# Patient Record
Sex: Male | Born: 1955 | Race: White | Hispanic: No | Smoking: Never smoker
Health system: Southern US, Community
[De-identification: ages and names within clinical notes are randomized; demographics above are authoritative.]

## PROBLEM LIST (undated history)

## (undated) DIAGNOSIS — Z87442 Personal history of urinary calculi: Secondary | ICD-10-CM

## (undated) DIAGNOSIS — T8859XA Other complications of anesthesia, initial encounter: Secondary | ICD-10-CM

## (undated) DIAGNOSIS — K219 Gastro-esophageal reflux disease without esophagitis: Secondary | ICD-10-CM

## (undated) DIAGNOSIS — T4145XA Adverse effect of unspecified anesthetic, initial encounter: Secondary | ICD-10-CM

## (undated) DIAGNOSIS — Z86718 Personal history of other venous thrombosis and embolism: Secondary | ICD-10-CM

## (undated) DIAGNOSIS — G8929 Other chronic pain: Secondary | ICD-10-CM

## (undated) DIAGNOSIS — M549 Dorsalgia, unspecified: Secondary | ICD-10-CM

## (undated) DIAGNOSIS — N200 Calculus of kidney: Secondary | ICD-10-CM

## (undated) DIAGNOSIS — R112 Nausea with vomiting, unspecified: Secondary | ICD-10-CM

## (undated) DIAGNOSIS — I509 Heart failure, unspecified: Secondary | ICD-10-CM

## (undated) DIAGNOSIS — H3589 Other specified retinal disorders: Secondary | ICD-10-CM

## (undated) DIAGNOSIS — M75100 Unspecified rotator cuff tear or rupture of unspecified shoulder, not specified as traumatic: Secondary | ICD-10-CM

## (undated) DIAGNOSIS — Z9889 Other specified postprocedural states: Secondary | ICD-10-CM

## (undated) DIAGNOSIS — M1990 Unspecified osteoarthritis, unspecified site: Secondary | ICD-10-CM

## (undated) HISTORY — PX: OTHER SURGICAL HISTORY: SHX169

## (undated) HISTORY — PX: TONSILLECTOMY: SUR1361

## (undated) HISTORY — DX: Personal history of other venous thrombosis and embolism: Z86.718

---

## 1995-04-06 HISTORY — PX: BACK SURGERY: SHX140

## 2008-04-05 DIAGNOSIS — Z86718 Personal history of other venous thrombosis and embolism: Secondary | ICD-10-CM

## 2008-04-05 HISTORY — DX: Personal history of other venous thrombosis and embolism: Z86.718

## 2008-11-29 ENCOUNTER — Emergency Department (HOSPITAL_COMMUNITY): Admission: EM | Admit: 2008-11-29 | Discharge: 2008-11-29 | Payer: Self-pay | Admitting: Emergency Medicine

## 2009-02-24 ENCOUNTER — Ambulatory Visit (HOSPITAL_COMMUNITY): Admission: RE | Admit: 2009-02-24 | Discharge: 2009-02-24 | Payer: Self-pay | Admitting: Internal Medicine

## 2009-04-05 HISTORY — PX: BACK SURGERY: SHX140

## 2010-04-26 ENCOUNTER — Encounter: Payer: Self-pay | Admitting: Internal Medicine

## 2010-07-11 LAB — URINALYSIS, ROUTINE W REFLEX MICROSCOPIC
Protein, ur: 30 mg/dL — AB
Specific Gravity, Urine: 1.03 (ref 1.005–1.030)

## 2010-07-11 LAB — URINE MICROSCOPIC-ADD ON

## 2011-06-29 ENCOUNTER — Other Ambulatory Visit: Payer: Self-pay

## 2011-06-29 ENCOUNTER — Telehealth: Payer: Self-pay

## 2011-06-29 DIAGNOSIS — Z139 Encounter for screening, unspecified: Secondary | ICD-10-CM

## 2011-06-29 NOTE — Telephone Encounter (Signed)
Gastroenterology Pre-Procedure Form    Request Date: 06/29/2011     Requesting Physician: Dr. Sherwood Gambler     PATIENT INFORMATION:  Gary Ferguson is a 56 y.o., male (DOB=12/26/1955).  PROCEDURE: Procedure(s) requested: colonoscopy Procedure Reason: screening for colon cancer  PATIENT REVIEW QUESTIONS: The patient reports the following:   1. Diabetes Melitis: no 2. Joint replacements in the past 12 months: no 3. Major health problems in the past 3 months: no 4. Has an artificial valve or MVP:no 5. Has been advised in past to take antibiotics in advance of a procedure like teeth cleaning: no}    MEDICATIONS & ALLERGIES:    Patient reports the following regarding taking any blood thinners:   Plavix? no Aspirin?no Coumadin?  no  Patient confirms/reports the following medications:  Current Outpatient Prescriptions  Medication Sig Dispense Refill  . ciprofloxacin (CIPRO) 500 MG tablet Take 500 mg by mouth 2 (two) times daily. Pt has been on Cipro 500 mg   Bid for about 2 years for an infection in his back      . naproxen sodium (ANAPROX) 220 MG tablet Take 220 mg by mouth once.      Marland Kitchen omeprazole (PRILOSEC) 20 MG capsule Take 20 mg by mouth daily.        Patient confirms/reports the following allergies:  Allergies  Allergen Reactions  . Penicillins     Pt unsure of the effect/ he was small    Patient is appropriate to schedule for requested procedure(s): yes  AUTHORIZATION INFORMATION Primary Insurance:   ID #:   Group #:  Pre-Cert / Auth required:  Pre-Cert / Auth #:   Secondary Insurance:   ID #:   Group #:  Pre-Cert / Auth required Pre-Cert / Auth #:   No orders of the defined types were placed in this encounter.    SCHEDULE INFORMATION: Procedure has been scheduled as follows:  Date: 07/21/2011     Time: 12:45 PM  Location: [facility] Short Stay  This Gastroenterology Pre-Precedure Form is being routed to the following provider(s) for review: R. Roetta Sessions, MD

## 2011-06-30 NOTE — Telephone Encounter (Signed)
OK to schedule

## 2011-07-05 NOTE — Telephone Encounter (Signed)
Rx and instructions mailed.  

## 2011-07-19 ENCOUNTER — Encounter (HOSPITAL_COMMUNITY): Payer: Self-pay | Admitting: Pharmacy Technician

## 2011-07-20 MED ORDER — SODIUM CHLORIDE 0.45 % IV SOLN
Freq: Once | INTRAVENOUS | Status: AC
  Administered 2011-07-21: 1000 mL via INTRAVENOUS

## 2011-07-21 ENCOUNTER — Encounter (HOSPITAL_COMMUNITY): Payer: Self-pay | Admitting: *Deleted

## 2011-07-21 ENCOUNTER — Ambulatory Visit (HOSPITAL_COMMUNITY)
Admission: RE | Admit: 2011-07-21 | Discharge: 2011-07-21 | Disposition: A | Discharge status: Home Health | Payer: Medicare Other | Source: Ambulatory Visit | Attending: Internal Medicine | Admitting: Internal Medicine

## 2011-07-21 ENCOUNTER — Encounter (HOSPITAL_COMMUNITY)
Admission: RE | Disposition: A | Discharge status: Home Health | Payer: Self-pay | Source: Ambulatory Visit | Attending: Internal Medicine

## 2011-07-21 DIAGNOSIS — Z1211 Encounter for screening for malignant neoplasm of colon: Secondary | ICD-10-CM

## 2011-07-21 DIAGNOSIS — Z139 Encounter for screening, unspecified: Secondary | ICD-10-CM

## 2011-07-21 DIAGNOSIS — K5730 Diverticulosis of large intestine without perforation or abscess without bleeding: Secondary | ICD-10-CM | POA: Insufficient documentation

## 2011-07-21 DIAGNOSIS — Z83719 Family history of colon polyps, unspecified: Secondary | ICD-10-CM | POA: Insufficient documentation

## 2011-07-21 DIAGNOSIS — Z8371 Family history of colonic polyps: Secondary | ICD-10-CM | POA: Insufficient documentation

## 2011-07-21 HISTORY — DX: Personal history of other venous thrombosis and embolism: Z86.718

## 2011-07-21 HISTORY — DX: Other specified retinal disorders: H35.89

## 2011-07-21 HISTORY — PX: COLONOSCOPY: SHX5424

## 2011-07-21 HISTORY — DX: Gastro-esophageal reflux disease without esophagitis: K21.9

## 2011-07-21 HISTORY — DX: Unspecified osteoarthritis, unspecified site: M19.90

## 2011-07-21 SURGERY — COLONOSCOPY
Anesthesia: Moderate Sedation

## 2011-07-21 MED ORDER — MIDAZOLAM HCL 5 MG/5ML IJ SOLN
INTRAMUSCULAR | Status: AC
  Filled 2011-07-21: qty 10

## 2011-07-21 MED ORDER — STERILE WATER FOR IRRIGATION IR SOLN
Status: DC | PRN
  Administered 2011-07-21: 11:00:00

## 2011-07-21 MED ORDER — MIDAZOLAM HCL 5 MG/5ML IJ SOLN
INTRAMUSCULAR | Status: DC | PRN
  Administered 2011-07-21 (×2): 2 mg via INTRAVENOUS

## 2011-07-21 MED ORDER — MEPERIDINE HCL 100 MG/ML IJ SOLN
INTRAMUSCULAR | Status: AC
  Filled 2011-07-21: qty 1

## 2011-07-21 MED ORDER — MEPERIDINE HCL 100 MG/ML IJ SOLN
INTRAMUSCULAR | Status: DC | PRN
Start: 2011-07-21 — End: 2011-07-21
  Administered 2011-07-21: 50 mg via INTRAVENOUS
  Administered 2011-07-21: 25 mg via INTRAVENOUS

## 2011-07-21 NOTE — Op Note (Signed)
[facility] [address] [place], [place]  [address]  COLONOSCOPY PROCEDURE REPORT  PATIENT:  Gary Ferguson, Gary Ferguson  MR#:  [id number] BIRTHDATE:  [birth year], 56 yrs. old  GENDER:  male ENDOSCOPIST:  R. Roetta Sessions, MD FACP [facility] REF. BY:  Artis Delay, M.D. PROCEDURE DATE:  07/21/2011 PROCEDURE:  Screening ileocolonoscopy  INDICATIONS:  First ever average risk screening examination.  INFORMED CONSENT:  The risks, benefits, alternatives and imponderables including but not limited to bleeding, perforation as well as the possibility of a missed lesion have been reviewed. The potential for biopsy, lesion removal, etc. have also been discussed.  Questions have been answered.  All parties agreeable. Please see the history and physical in the medical record for more information.  MEDICATIONS:  Versed 4 mg IV and Demerol 75 mg IV in divided doses.  DESCRIPTION OF PROCEDURE:  After a digital rectal exam was performed, the EC-[id number] ([id number]) colonoscope was advanced from the anus through the rectum and colon to the area of the cecum, ileocecal valve and appendiceal orifice.  The cecum was deeply intubated.  These structures were well-seen and photographed for the record.  From the level of the cecum and ileocecal valve, the scope was slowly and cautiously withdrawn.  The mucosal surfaces were carefully surveyed utilizing scope tip deflection to facilitate fold flattening as needed.  The scope was pulled down into the rectum where a thorough examination including retroflexion was performed. <<PROCEDUREIMAGES>>  FINDINGS: Adequate preparation. Normal rectum. Sigmoid diverticula; remainder of colonic mucosa appeared normal. Distal 5 cm of terminal ileal mucosa appeared normal.  THERAPEUTIC / DIAGNOSTIC MANEUVERS PERFORMED: None  COMPLICATIONS:  None  CECAL WITHDRAWAL TIME: 8 minutes  IMPRESSION:   Colonic diverticulosis  RECOMMENDATIONS: Recommend repeat screening colonoscopy  in 10 years  ______________________________ R. Roetta Sessions, MD Caleen Essex  CC:  Artis Delay, M.D.  n. eSIGNED:   R. Roetta Sessions at 07/21/2011 11:55 AM  Marlou Porch, [phone]

## 2011-07-21 NOTE — Discharge Instructions (Addendum)
Colonoscopy Discharge Instructions  Read the instructions outlined below and refer to this sheet in the next few weeks. These discharge instructions provide you with general information on caring for yourself after you leave the hospital. Your doctor may also give you specific instructions. While your treatment has been planned according to the most current medical practices available, unavoidable complications occasionally occur. If you have any problems or questions after discharge, call Dr. Jena Gauss at [phone]. ACTIVITY  You may resume your regular activity, but move at a slower pace for the next 24 hours.   Take frequent rest periods for the next 24 hours.   Walking will help get rid of the air and reduce the bloated feeling in your belly (abdomen).   No driving for 24 hours (because of the medicine (anesthesia) used during the test).    Do not sign any important legal documents or operate any machinery for 24 hours (because of the anesthesia used during the test).  NUTRITION  Drink plenty of fluids.   You may resume your normal diet as instructed by your doctor.   Begin with a light meal and progress to your normal diet. Heavy or fried foods are harder to digest and may make you feel sick to your stomach (nauseated).   Avoid alcoholic beverages for 24 hours or as instructed.  MEDICATIONS  You may resume your normal medications unless your doctor tells you otherwise.  WHAT YOU CAN EXPECT TODAY  Some feelings of bloating in the abdomen.   Passage of more gas than usual.   Spotting of blood in your stool or on the toilet paper.  IF YOU HAD POLYPS REMOVED DURING THE COLONOSCOPY:  No aspirin products for 7 days or as instructed.   No alcohol for 7 days or as instructed.   Eat a soft diet for the next 24 hours.  FINDING OUT THE RESULTS OF YOUR TEST Not all test results are available during your visit. If your test results are not back during the visit, make an appointment  with your caregiver to find out the results. Do not assume everything is normal if you have not heard from your caregiver or the medical facility. It is important for you to follow up on all of your test results.  SEEK IMMEDIATE MEDICAL ATTENTION IF:  You have more than a spotting of blood in your stool.   Your belly is swollen (abdominal distention).   You are nauseated or vomiting.   You have a temperature over 101.   You have abdominal pain or discomfort that is severe or gets worse throughout the day.    Diverticulosis information provided.  Repeat screening colonoscopy in 10 years.  Diverticulosis Diverticulosis is a common condition that develops when small pouches (diverticula) form in the wall of the colon. The risk of diverticulosis increases with age. It happens more often in people who eat a low-fiber diet. Most individuals with diverticulosis have no symptoms. Those individuals with symptoms usually experience abdominal pain, constipation, or diarrhea.  Eating a high-fiber diet has been shown to reduce discomfort and other symptoms of diverticulosis. Fiber may also slow the progression of diverticulosis. Foods having high fiber content include:  Baked beans, kidney beans, split peas, lentils.   Bran cereals, shredded wheat, bran muffins, whole-grain breads.   Fresh fruit, raisins, prunes.   Potatoes (with skin), broccoli, spinach, zucchini.  You may feel a little bloated when you introduce more fiber into your diet. These symptoms usually pass in time. Drink  enough water and fluids to keep your urine clear or pale yellow, to prevent constipation. Try not to strain when you have a bowel movement. Some caregivers may recommend avoiding nuts and seeds to prevent complications of diverticulosis. Mild pain medicine may help soothe pain and spasms. Only take over-the-counter or prescription medicines for pain, discomfort, or fever as directed by your caregiver. Some complications  of diverticulosis include an infection of the diverticula (diverticulitis), rectal bleeding, or blockage of the colon (bowel obstruction). SEEK IMMEDIATE MEDICAL CARE IF:   You develop increasing pain or severe bloating.   You have an oral temperature above 102 F (38.9 C), not controlled by medicine.   You develop vomiting or bowel movements that are bloody or black.  Document Released: 03/22/2005 Document Revised: 03/11/2011 Document Reviewed: 08/20/2009 [facility] Patient Information 2012 [place], [place].

## 2011-07-21 NOTE — H&P (Signed)
Primary Care Physician:  Cassell Smiles., MD, MD Primary Gastroenterologist:  Dr.   Pre-Procedure History & Physical: HPI:  Gary Ferguson is a 56 y.o. male is here for a screening colonoscopy. First ever colonoscopy. Father with colon polyps but at advanced age. No family history of colon cancer. No bowel symptoms  Past Medical History  Diagnosis Date  . GERD (gastroesophageal reflux disease)   . Hx of blood clots 2010  . Macular atrophy, retinal     bilateral  . Arthritis     Past Surgical History  Procedure Date  . Back surgery 1997  . Back surgery 04/2009    3 separate surgies for infection   . Tonsillectomy     Prior to Admission medications   Medication Sig Start Date End Date Taking? Authorizing Provider  ciprofloxacin (CIPRO) 500 MG tablet Take 500 mg by mouth 2 (two) times daily. Pt has been on Cipro 500 mg   Yes Historical Provider, MD  naproxen sodium (ANAPROX) 220 MG tablet Take 220 mg by mouth 2 (two) times daily as needed. For pain    Yes Historical Provider, MD  omeprazole (PRILOSEC) 20 MG capsule Take 20 mg by mouth every morning.    Yes Historical Provider, MD  loratadine (CLARITIN) 10 MG tablet Take 10 mg by mouth daily as needed. For allergies    Historical Provider, MD    Allergies as of 06/29/2011 - Review Complete 06/29/2011  Allergen Reaction Noted  . Penicillins  06/29/2011    Family History  Problem Relation Age of Onset  . Liver disease Mother   . Hypertension Father     History   Social History  . Marital Status: Married    Spouse Name: N/A    Number of Children: N/A  . Years of Education: N/A   Occupational History  . Not on file.   Social History Main Topics  . Smoking status: Never Smoker   . Smokeless tobacco: Not on file  . Alcohol Use: No  . Drug Use: No  . Sexually Active:    Other Topics Concern  . Not on file   Social History Narrative  . No narrative on file    Review of Systems: See HPI, otherwise negative  ROS  Physical Exam: BP 136/86  Pulse 68  Temp(Src) 98.1 F (36.7 C) (Oral)  Resp 18  Ht 5\' 11"  (1.803 m)  Wt 207 lb (93.895 kg)  BMI 28.87 kg/m2  SpO2 97% General:   Alert,  Well-developed, well-nourished, pleasant and cooperative in NAD Head:  Normocephalic and atraumatic. Eyes:  Sclera clear, no icterus.   Conjunctiva pink. Ears:  Normal auditory acuity. Nose:  No deformity, discharge,  or lesions. Mouth:  No deformity or lesions, dentition normal. Neck:  Supple; no masses or thyromegaly. Lungs:  Clear throughout to auscultation.   No wheezes, crackles, or rhonchi. No acute distress. Heart:  Regular rate and rhythm; no murmurs, clicks, rubs,  or gallops. Abdomen:  Soft, nontender and nondistended. No masses, hepatosplenomegaly or hernias noted. Normal bowel sounds, without guarding, and without rebound.   Msk:  Symmetrical without gross deformities. Normal posture. Pulses:  Normal pulses noted. Extremities:  Without clubbing or edema. Neurologic:  Alert and  oriented x4;  grossly normal neurologically. Skin:  Intact without significant lesions or rashes. Cervical Nodes:  No significant cervical adenopathy. Psych:  Alert and cooperative. Normal mood and affect.  Impression/Plan: Gary Ferguson is now here to undergo a screening colonoscopy. First-ever average risk  screening exam  Risks, benefits, limitations, imponderables and alternatives regarding colonoscopy have been reviewed with the patient. Questions have been answered. All parties agreeable.

## 2011-07-23 ENCOUNTER — Encounter (HOSPITAL_COMMUNITY): Payer: Self-pay | Admitting: Internal Medicine

## 2012-11-18 ENCOUNTER — Encounter (HOSPITAL_COMMUNITY): Payer: Self-pay | Admitting: *Deleted

## 2012-11-18 ENCOUNTER — Emergency Department (HOSPITAL_COMMUNITY)
Admission: EM | Admit: 2012-11-18 | Discharge: 2012-11-18 | Disposition: A | Discharge status: Home / Self Care | Payer: Medicare Other | Attending: Emergency Medicine | Admitting: Emergency Medicine

## 2012-11-18 ENCOUNTER — Emergency Department (HOSPITAL_COMMUNITY): Payer: Medicare Other

## 2012-11-18 DIAGNOSIS — M129 Arthropathy, unspecified: Secondary | ICD-10-CM | POA: Insufficient documentation

## 2012-11-18 DIAGNOSIS — Z79899 Other long term (current) drug therapy: Secondary | ICD-10-CM | POA: Insufficient documentation

## 2012-11-18 DIAGNOSIS — T148 Other injury of unspecified body region: Secondary | ICD-10-CM | POA: Insufficient documentation

## 2012-11-18 DIAGNOSIS — Y9289 Other specified places as the place of occurrence of the external cause: Secondary | ICD-10-CM | POA: Insufficient documentation

## 2012-11-18 DIAGNOSIS — K219 Gastro-esophageal reflux disease without esophagitis: Secondary | ICD-10-CM | POA: Insufficient documentation

## 2012-11-18 DIAGNOSIS — R05 Cough: Secondary | ICD-10-CM | POA: Insufficient documentation

## 2012-11-18 DIAGNOSIS — B9789 Other viral agents as the cause of diseases classified elsewhere: Secondary | ICD-10-CM | POA: Insufficient documentation

## 2012-11-18 DIAGNOSIS — B349 Viral infection, unspecified: Secondary | ICD-10-CM

## 2012-11-18 DIAGNOSIS — R059 Cough, unspecified: Secondary | ICD-10-CM | POA: Insufficient documentation

## 2012-11-18 DIAGNOSIS — Y9389 Activity, other specified: Secondary | ICD-10-CM | POA: Insufficient documentation

## 2012-11-18 DIAGNOSIS — G8929 Other chronic pain: Secondary | ICD-10-CM | POA: Insufficient documentation

## 2012-11-18 DIAGNOSIS — Z86718 Personal history of other venous thrombosis and embolism: Secondary | ICD-10-CM | POA: Insufficient documentation

## 2012-11-18 DIAGNOSIS — Z880 Allergy status to penicillin: Secondary | ICD-10-CM | POA: Insufficient documentation

## 2012-11-18 DIAGNOSIS — J3489 Other specified disorders of nose and nasal sinuses: Secondary | ICD-10-CM | POA: Insufficient documentation

## 2012-11-18 DIAGNOSIS — W57XXXA Bitten or stung by nonvenomous insect and other nonvenomous arthropods, initial encounter: Secondary | ICD-10-CM | POA: Insufficient documentation

## 2012-11-18 DIAGNOSIS — R509 Fever, unspecified: Secondary | ICD-10-CM | POA: Insufficient documentation

## 2012-11-18 HISTORY — DX: Other chronic pain: G89.29

## 2012-11-18 HISTORY — DX: Dorsalgia, unspecified: M54.9

## 2012-11-18 LAB — URINALYSIS, ROUTINE W REFLEX MICROSCOPIC
Bilirubin Urine: NEGATIVE
Ketones, ur: NEGATIVE mg/dL
Leukocytes, UA: NEGATIVE
Nitrite: NEGATIVE
Protein, ur: NEGATIVE mg/dL
pH: 6 (ref 5.0–8.0)

## 2012-11-18 MED ORDER — IBUPROFEN 400 MG PO TABS
400.0000 mg | ORAL_TABLET | Freq: Once | ORAL | Status: AC
  Administered 2012-11-18: 400 mg via ORAL
  Filled 2012-11-18: qty 1

## 2012-11-18 MED ORDER — DOXYCYCLINE HYCLATE 100 MG PO TABS: 100.0000 mg | ORAL_TABLET | Freq: Two times a day (BID) | ORAL | Status: DC

## 2012-11-18 MED ORDER — ACETAMINOPHEN 500 MG PO TABS
1000.0000 mg | ORAL_TABLET | Freq: Once | ORAL | Status: AC
  Administered 2012-11-18: 1000 mg via ORAL
  Filled 2012-11-18: qty 2

## 2012-11-18 MED ORDER — DOXYCYCLINE HYCLATE 100 MG PO TABS
100.0000 mg | ORAL_TABLET | Freq: Once | ORAL | Status: AC
  Administered 2012-11-18: 100 mg via ORAL
  Filled 2012-11-18: qty 1

## 2012-11-18 NOTE — ED Notes (Signed)
Pt c/o generalized bodyaches, fever, cough, and congestion.

## 2012-11-18 NOTE — ED Provider Notes (Signed)
CSN: [id number]     Arrival date & time 11/18/12  1950 History     First MD Initiated Contact with Patient 11/18/12 2000     Chief Complaint  Patient presents with  . Generalized Body Aches  . Chills  . Fever  . Nasal Congestion    HPI Pt was seen at 2000.  Per pt, c/o gradual onset and persistence of constant runny/stuffy nose, sinus congestion, generalized body aches/fatigue and cough for the past 1 week.  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  Denies rash, no CP/SOB, no N/V/D, no abd pain.     Past Medical History  Diagnosis Date  . GERD (gastroesophageal reflux disease)   . Hx of blood clots 2010  . Macular atrophy, retinal     bilateral  . Arthritis   . Chronic back pain    Past Surgical History  Procedure Laterality Date  . Back surgery  1997  . Back surgery  04/2009    3 separate surgies for infection   . Tonsillectomy    . Colonoscopy  07/21/2011    Procedure: COLONOSCOPY;  Surgeon: [name], MD;  Location: [facility];  Service: Endoscopy;  Laterality: N/A;  12:45 PM   Family History  Problem Relation Age of Onset  . Liver disease Mother   . Hypertension Father    History  Substance Use Topics  . Smoking status: Never Smoker   . Smokeless tobacco: Not on file  . Alcohol Use: No    Review of Systems ROS: Statement: All systems negative except as marked or noted in the HPI; Constitutional: +subjective fever and chills, generalized body aches/fatigue. ; ; Eyes: Negative for eye pain, redness and discharge. ; ; ENMT: Negative for ear pain, hoarseness, sore throat. +nasal congestion, sinus pressure and rhinorrhea. ; ; Cardiovascular: Negative for chest pain, palpitations, diaphoresis, dyspnea and peripheral edema. ; ; Respiratory: +cough. Negative for wheezing and stridor. ; ; Gastrointestinal: Negative for nausea, vomiting, diarrhea, abdominal pain, blood in stool, hematemesis, jaundice  and rectal bleeding. . ; ; Genitourinary: Negative for dysuria, flank pain and hematuria. ; ; Musculoskeletal: Negative for back pain and neck pain. Negative for swelling and trauma.; ; Skin: Negative for pruritus, rash, abrasions, blisters, bruising and skin lesion.; ; Neuro: Negative for headache, lightheadedness and neck stiffness. Negative for weakness, altered level of consciousness , altered mental status, extremity weakness, paresthesias, involuntary movement, seizure and syncope.       Allergies  Penicillins  Home Medications   Current Outpatient Rx  Name  Route  Sig  Dispense  Refill  . ciprofloxacin (CIPRO) 500 MG tablet   Oral   Take 500 mg by mouth 2 (two) times daily. Pt has been on Cipro 500 mg         . loratadine (CLARITIN) 10 MG tablet   Oral   Take 10 mg by mouth daily as needed. For allergies         . naproxen sodium (ANAPROX) 220 MG tablet   Oral   Take 220 mg by mouth 2 (two) times daily as needed. For pain          . omeprazole (PRILOSEC) 20 MG capsule   Oral   Take 20 mg by mouth every morning.           BP 131/76  Pulse 119  Temp(Src) 100.3 F (37.9 C) (Oral)  Ht 5\' 11"  (1.803 m)  Wt 195 lb (88.451 kg)  BMI 27.21 kg/m2  SpO2 100% Physical Exam 2005: Physical examination:  Nursing notes reviewed; Vital signs and O2 SAT reviewed;  Constitutional: Well developed, Well nourished, Well hydrated, In no acute distress; Head:  Normocephalic, atraumatic; Eyes: EOMI, PERRL, No scleral icterus; ENMT: TM's clear bilat. +edemetous nasal turbinates bilat with clear rhinorrhea. Mouth and pharynx normal, Mucous membranes moist; Neck: Supple, No meningeal signs. Full range of motion, No lymphadenopathy; Cardiovascular: Tachycardic rate and rhythm, No gallop; Respiratory: Breath sounds clear & equal bilaterally, No rales, rhonchi, wheezes.  Speaking full sentences with ease, Normal respiratory effort/excursion; Chest: Nontender, Movement normal; Abdomen: Soft,  Nontender, Nondistended, Normal bowel sounds; Genitourinary: No CVA tenderness; Extremities: Pulses normal, No tenderness, No edema, No calf edema or asymmetry.; Neuro: AA&Ox3, Major CN grossly intact. No facial droop. Speech clear. Climbs on and off stretcher easily by himself. Gait steady. No gross focal motor or sensory deficits in extremities.; Skin: Color normal, Warm, Dry.   ED Course   Procedures  MDM  MDM Reviewed: previous chart, nursing note and vitals Interpretation: labs and x-ray   Results for orders placed during the hospital encounter of 11/18/12  URINALYSIS, ROUTINE W REFLEX MICROSCOPIC      Result Value Range   Color, Urine AMBER (*) YELLOW   APPearance CLEAR  CLEAR   Specific Gravity, Urine 1.020  1.005 - 1.030   pH 6.0  5.0 - 8.0   Glucose, UA NEGATIVE  NEGATIVE mg/dL   Hgb urine dipstick NEGATIVE  NEGATIVE   Bilirubin Urine NEGATIVE  NEGATIVE   Ketones, ur NEGATIVE  NEGATIVE mg/dL   Protein, ur NEGATIVE  NEGATIVE mg/dL   Urobilinogen, UA 0.2  0.0 - 1.0 mg/dL   Nitrite NEGATIVE  NEGATIVE   Leukocytes, UA NEGATIVE  NEGATIVE   Dg Chest 2 View 11/18/2012   *RADIOLOGY REPORT*  Clinical Data: Fever, chills, cough, nasal congestion  CHEST - 2 VIEW  Comparison: None.  Findings: Cardiomediastinal silhouette is unremarkable.  No acute infiltrate or pleural effusion.  No pulmonary edema.  Metallic fixation material noted lumbar spine.  IMPRESSION: No active disease.   Original Report Authenticated By: Natasha Mead, M.D.    2100:  Endorses symptoms began a few weeks after tick bite. Will tx with doxycycline. Dx and testing d/w pt and family.  Questions answered.  Verb understanding, agreeable to d/c home with outpt f/u.   Laray Anger, DO 11/21/12 1133

## 2013-06-05 ENCOUNTER — Other Ambulatory Visit (HOSPITAL_COMMUNITY): Payer: Self-pay | Admitting: Internal Medicine

## 2013-06-05 DIAGNOSIS — M79605 Pain in left leg: Secondary | ICD-10-CM

## 2013-06-05 DIAGNOSIS — R29898 Other symptoms and signs involving the musculoskeletal system: Secondary | ICD-10-CM

## 2013-06-05 DIAGNOSIS — M549 Dorsalgia, unspecified: Secondary | ICD-10-CM

## 2013-06-07 ENCOUNTER — Ambulatory Visit (HOSPITAL_COMMUNITY): Payer: Medicare HMO

## 2013-06-11 ENCOUNTER — Ambulatory Visit (HOSPITAL_COMMUNITY)
Admission: RE | Admit: 2013-06-11 | Discharge: 2013-06-11 | Disposition: A | Discharge status: Home / Self Care | Payer: Medicare HMO | Source: Ambulatory Visit | Attending: Internal Medicine | Admitting: Internal Medicine

## 2013-06-11 DIAGNOSIS — M549 Dorsalgia, unspecified: Secondary | ICD-10-CM

## 2013-06-11 DIAGNOSIS — R29898 Other symptoms and signs involving the musculoskeletal system: Secondary | ICD-10-CM

## 2013-06-11 DIAGNOSIS — M48061 Spinal stenosis, lumbar region without neurogenic claudication: Secondary | ICD-10-CM | POA: Insufficient documentation

## 2013-06-11 DIAGNOSIS — M5126 Other intervertebral disc displacement, lumbar region: Secondary | ICD-10-CM | POA: Insufficient documentation

## 2013-06-11 DIAGNOSIS — M545 Low back pain, unspecified: Secondary | ICD-10-CM | POA: Insufficient documentation

## 2013-06-11 DIAGNOSIS — M51379 Other intervertebral disc degeneration, lumbosacral region without mention of lumbar back pain or lower extremity pain: Secondary | ICD-10-CM | POA: Insufficient documentation

## 2013-06-11 DIAGNOSIS — M5137 Other intervertebral disc degeneration, lumbosacral region: Secondary | ICD-10-CM | POA: Insufficient documentation

## 2013-06-11 DIAGNOSIS — M79605 Pain in left leg: Secondary | ICD-10-CM

## 2013-06-11 DIAGNOSIS — Z981 Arthrodesis status: Secondary | ICD-10-CM | POA: Insufficient documentation

## 2014-06-13 ENCOUNTER — Other Ambulatory Visit (HOSPITAL_COMMUNITY): Payer: Self-pay | Admitting: Internal Medicine

## 2014-06-13 DIAGNOSIS — K219 Gastro-esophageal reflux disease without esophagitis: Secondary | ICD-10-CM

## 2014-06-18 ENCOUNTER — Ambulatory Visit (HOSPITAL_COMMUNITY)
Admission: RE | Admit: 2014-06-18 | Discharge: 2014-06-18 | Disposition: A | Discharge status: Home / Self Care | Payer: Commercial Managed Care - HMO | Source: Ambulatory Visit | Attending: Internal Medicine | Admitting: Internal Medicine

## 2014-06-18 DIAGNOSIS — K219 Gastro-esophageal reflux disease without esophagitis: Secondary | ICD-10-CM

## 2015-07-22 ENCOUNTER — Other Ambulatory Visit: Payer: Self-pay | Admitting: *Deleted

## 2015-07-22 DIAGNOSIS — M7989 Other specified soft tissue disorders: Secondary | ICD-10-CM

## 2015-09-15 ENCOUNTER — Encounter: Payer: Self-pay | Admitting: Vascular Surgery

## 2015-09-24 ENCOUNTER — Encounter: Payer: Self-pay | Admitting: Vascular Surgery

## 2015-09-24 ENCOUNTER — Ambulatory Visit (INDEPENDENT_AMBULATORY_CARE_PROVIDER_SITE_OTHER): Payer: Commercial Managed Care - HMO | Admitting: Vascular Surgery

## 2015-09-24 ENCOUNTER — Ambulatory Visit (HOSPITAL_COMMUNITY)
Admission: RE | Admit: 2015-09-24 | Discharge: 2015-09-24 | Disposition: A | Discharge status: Home / Self Care | Payer: Commercial Managed Care - HMO | Source: Ambulatory Visit | Attending: Vascular Surgery | Admitting: Vascular Surgery

## 2015-09-24 VITALS — Ht 71.0 in | Wt 199.9 lb

## 2015-09-24 DIAGNOSIS — I82401 Acute embolism and thrombosis of unspecified deep veins of right lower extremity: Secondary | ICD-10-CM | POA: Diagnosis not present

## 2015-09-24 DIAGNOSIS — M7989 Other specified soft tissue disorders: Secondary | ICD-10-CM | POA: Diagnosis present

## 2015-09-24 DIAGNOSIS — I872 Venous insufficiency (chronic) (peripheral): Secondary | ICD-10-CM | POA: Diagnosis not present

## 2015-09-24 NOTE — Progress Notes (Signed)
[facility]  Patient name: Gary Ferguson MRN: [id number] DOB: [birth year] Sex: male  REASON FOR CONSULT: right lower extremity varicose veins and swelling. Referred by Dr. Sherwood Gambler.  HPI: Gary Ferguson is a 60 y.o. 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 He had a right lower extremity DVT after back surgery back in 2011. He essentially had a back infection and required further surgery and I think he had a filter placed at [facility] at this point because of a contraindication to anticoagulation. He does have some aching pain in his right leg which is associated with standing and relieved with rest. 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 He denies any symptoms in the left leg. He was on Coumadin for about 5 months but has not been on anticoagulation since then.  He denies any chest pain or shortness of breath.  He is scheduled for a hip replacement on the left in the near future.  Past Medical History  Diagnosis Date  . GERD (gastroesophageal reflux disease)   . Hx of blood clots 2010  . Macular atrophy, retinal     bilateral  . Arthritis   . Chronic back pain   . History of DVT of lower extremity     Family History  Problem Relation Age of Onset  . Liver disease Mother   . Hypertension Father     SOCIAL HISTORY: Social History   Social History  . Marital Status: Married    Spouse Name: N/A  . Number of Children: N/A  . Years of Education: N/A   Occupational History  . Not on file.   Social History Main Topics  . Smoking status: Never Smoker   . Smokeless tobacco: Not on file  . Alcohol Use: No  . Drug Use: No  . Sexual Activity: Not on file   Other Topics Concern  . Not on file   Social History Narrative    Allergies  Allergen Reactions  . Penicillins Other (See Comments)    Reaction: unknown childhood allergy    Current Outpatient Prescriptions  Medication  Sig Dispense Refill  . aspirin 81 MG tablet Take 81 mg by mouth daily.    . ciprofloxacin (CIPRO) 500 MG tablet Take 500 mg by mouth 2 (two) times daily. Pt has been on Cipro 500 mg    . Multiple Vitamin (MULTIVITAMIN) capsule Take 1 capsule by mouth daily.    . naproxen sodium (ANAPROX) 220 MG tablet Take 440 mg by mouth daily as needed. Reported on 09/24/2015    . omeprazole (PRILOSEC) 20 MG capsule Take 20 mg by mouth every morning.     Marland Kitchen doxycycline (VIBRA-TABS) 100 MG tablet Take 1 tablet (100 mg total) by mouth 2 (two) times daily. (Patient not taking: Reported on 09/24/2015) 28 tablet 0  . loratadine (CLARITIN) 10 MG tablet Take 10 mg by mouth daily as needed. Reported on 09/24/2015     No current facility-administered medications for this visit.    REVIEW OF SYSTEMS:  [X]  denotes positive finding, [ ]  denotes negative finding Cardiac  Comments:  Chest pain or chest pressure:    Shortness of breath upon exertion:    Short of breath when lying flat:    Irregular heart rhythm:        Vascular    Pain in calf, thigh, or hip brought on by ambulation: X Right leg  Pain in feet at night that wakes you up from your sleep:     Blood clot in your veins:    Leg swelling:         Pulmonary    Oxygen at home:    Productive cough:     Wheezing:         Neurologic    Sudden weakness in arms or legs:     Sudden numbness in arms or legs:     Sudden onset of difficulty speaking or slurred speech:    Temporary loss of vision in one eye:     Problems with dizziness:         Gastrointestinal    Blood in stool:     Vomited blood:         Genitourinary    Burning when urinating:     Blood in urine: X       Psychiatric    Major depression:         Hematologic    Bleeding problems:    Problems with blood clotting too easily:        Skin    Rashes or ulcers:        Constitutional    Fever or chills:      PHYSICAL EXAM: Filed Vitals:   09/24/15 0940  Height:  (1.803 m)    Weight: 199 lb 14.4 oz (90.674 kg)    GENERAL: The patient is a well-nourished male, in no acute distress. The vital signs are documented above. CARDIAC: There is a regular rate and rhythm.  VASCULAR: I do not detect carotid bruits. He has palpable femoral, popliteal, dorsalis pedis, and posterior tibial pulses bilaterally. Currently he has no significant lower extremity swelling. PULMONARY: There is good air exchange bilaterally without wheezing or rales. ABDOMEN: Soft and non-tender with normal pitched bowel sounds.  MUSCULOSKELETAL: There are no major deformities or cyanosis. NEUROLOGIC: No focal weakness or paresthesias are detected. SKIN: There are no ulcers or rashes noted. He does have some hyperpigmentation on the right lower leg consistent with chronic venous insufficiency. PSYCHIATRIC: The patient has a normal affect.  DATA:   LOWER EXTREMITY VENOUS DUPLEX: I have independently interpreted his lower extremity venous duplex scan of the right lower extremity. There is evidence of partially obstructing chronic clot involving the right common femoral vein, femoral vein, and popliteal vein. There is no evidence of reflux in the great saphenous vein on the right or small saphenous vein.  I did review the disc which had a CT scan that he had done recently which shows that his IVC filter appears to be in good position.  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 He has no evidence of significant reflux in the superficial system on the right. He has minimal leg swelling currently and his symptoms appear tolerable. I have discussed with him the importance of intermittent leg elevation and proper positioning for this. In addition I have written him a prescription for knee-high compression stockings with a gradient of 15-20 mmHg. I've encouraged him to stay as active as possible and  to avoid prolonged sitting and standing. He will obviously need aggressive DVT prophylaxis at the time of his upcoming hip surgery. I'll be happy to see him back at any time if any new vascular issues arise.   Waverly Ferrari [facility] [place] [phone]

## 2015-10-23 ENCOUNTER — Ambulatory Visit: Payer: Self-pay | Admitting: Orthopedic Surgery

## 2015-10-29 ENCOUNTER — Ambulatory Visit: Payer: Self-pay | Admitting: Orthopedic Surgery

## 2015-10-29 NOTE — H&P (Signed)
TOTAL HIP ADMISSION H&P  Patient is admitted for left total hip arthroplasty.  Subjective:  Chief Complaint: left hip pain  HPI: Gary Ferguson, 60 y.o. male, has a history of pain and functional disability in the left hip(s) due to arthritis and patient has failed non-surgical conservative treatments for greater than 12 weeks to include NSAID's and/or analgesics, flexibility and strengthening excercises, use of assistive devices, weight reduction as appropriate and activity modification.  Onset of symptoms was gradual starting >10 years ago with rapidlly worsening course since that time.The patient noted no past surgery on the left hip(s).  Patient currently rates pain in the left hip at 10 out of 10 with activity. Patient has night pain, worsening of pain with activity and weight bearing, pain that interfers with activities of daily living, pain with passive range of motion and crepitus. Patient has evidence of subchondral cysts, subchondral sclerosis, periarticular osteophytes and joint space narrowing by imaging studies. This condition presents safety issues increasing the risk of falls. There is no current active infection.  There are no active problems to display for this patient.  Past Medical History:  Diagnosis Date  . Arthritis   . Chronic back pain   . GERD (gastroesophageal reflux disease)   . History of DVT of lower extremity   . Hx of blood clots 2010  . Macular atrophy, retinal    bilateral    Past Surgical History:  Procedure Laterality Date  . BACK SURGERY  1997  . BACK SURGERY  04/2009   3 separate surgies for infection   . COLONOSCOPY  07/21/2011   Procedure: COLONOSCOPY;  Surgeon: Robert M Rourk, MD;  Location: [facility];  Service: Endoscopy;  Laterality: N/A;  12:45 PM  . TONSILLECTOMY       (Not in a hospital admission) Allergies  Allergen Reactions  . Penicillins Other (See Comments)     Has patient had a PCN reaction causing immediate rash,  facial/tongue/throat swelling, SOB or lightheadedness with hypotension: unknown childhood allergy Has patient had a PCN reaction causing severe rash involving mucus membranes or skin necrosis: No Has patient had a PCN reaction that required hospitalization No Has patient had a PCN reaction occurring within the last 10 years: No If all of the above answers are "NO", then may proceed with Cephalosporin use.     Social History  Substance Use Topics  . Smoking status: Never Smoker  . Smokeless tobacco: Not on file  . Alcohol use No    Family History  Problem Relation Age of Onset  . Liver disease Mother   . Hypertension Father      Review of Systems  Constitutional: Negative.   HENT: Negative.   Eyes: Negative.   Respiratory: Negative.   Cardiovascular: Negative.   Gastrointestinal: Negative.   Genitourinary: Positive for frequency, hematuria and urgency.  Musculoskeletal: Positive for back pain and joint pain.  Skin: Negative.   Neurological: Negative.   Endo/Heme/Allergies: Negative.   Psychiatric/Behavioral: Negative.     Objective:  Physical Exam  Constitutional: He is oriented to person, place, and time. He appears well-developed and well-nourished.  HENT:  Head: Normocephalic and atraumatic.  Eyes: Conjunctivae and EOM are normal. Pupils are equal, round, and reactive to light.  Neck: Neck supple.  Cardiovascular: Normal rate, regular rhythm and intact distal pulses.   Respiratory: Effort normal and breath sounds normal. No respiratory distress.  GI: Soft. Bowel sounds are normal. He exhibits no distension.  Genitourinary:  Genitourinary Comments: deferred    Musculoskeletal:       Left hip: He exhibits decreased range of motion and bony tenderness.  Neurological: He is alert and oriented to person, place, and time. He has normal reflexes.  Skin: Skin is warm and dry.  Psychiatric: He has a normal mood and affect. His behavior is normal. Judgment and thought content  normal.    Vital signs in last 24 hours: @VSRANGES @  Labs:   Estimated body mass index is 27.88 kg/m as calculated from the following:   Height as of 09/24/15: 5\' 11"  (1.803 m).   Weight as of 09/24/15: 90.7 kg (199 lb 14.4 oz).   Imaging Review Plain radiographs demonstrate severe degenerative joint disease of the left hip(s). The bone quality appears to be adequate for age and reported activity level.  Assessment/Plan:  End stage arthritis, left hip(s)  The patient history, physical examination, clinical judgement of the provider and imaging studies are consistent with end stage degenerative joint disease of the left hip(s) and total hip arthroplasty is deemed medically necessary. The treatment options including medical management, injection therapy, arthroscopy and arthroplasty were discussed at length. The risks and benefits of total hip arthroplasty were presented and reviewed. The risks due to aseptic loosening, infection, stiffness, dislocation/subluxation,  thromboembolic complications and other imponderables were discussed.  The patient acknowledged the explanation, agreed to proceed with the plan and consent was signed. Patient is being admitted for inpatient treatment for surgery, pain control, PT, OT, prophylactic antibiotics, VTE prophylaxis, progressive ambulation and ADL's and discharge planning.The patient is planning to be discharged home with home health services

## 2015-10-30 ENCOUNTER — Encounter (HOSPITAL_COMMUNITY)
Admission: RE | Admit: 2015-10-30 | Discharge: 2015-10-30 | Disposition: A | Discharge status: Home / Self Care | Payer: Commercial Managed Care - HMO | Source: Ambulatory Visit | Attending: Orthopedic Surgery | Admitting: Orthopedic Surgery

## 2015-10-30 ENCOUNTER — Encounter (HOSPITAL_COMMUNITY): Payer: Self-pay

## 2015-10-30 DIAGNOSIS — Z01818 Encounter for other preprocedural examination: Secondary | ICD-10-CM | POA: Diagnosis present

## 2015-10-30 DIAGNOSIS — Z0183 Encounter for blood typing: Secondary | ICD-10-CM | POA: Insufficient documentation

## 2015-10-30 DIAGNOSIS — Z01812 Encounter for preprocedural laboratory examination: Secondary | ICD-10-CM | POA: Diagnosis not present

## 2015-10-30 DIAGNOSIS — M1612 Unilateral primary osteoarthritis, left hip: Secondary | ICD-10-CM | POA: Diagnosis not present

## 2015-10-30 HISTORY — DX: Adverse effect of unspecified anesthetic, initial encounter: T41.45XA

## 2015-10-30 HISTORY — DX: Other complications of anesthesia, initial encounter: T88.59XA

## 2015-10-30 HISTORY — DX: Calculus of kidney: N20.0

## 2015-10-30 HISTORY — DX: Unspecified rotator cuff tear or rupture of unspecified shoulder, not specified as traumatic: M75.100

## 2015-10-30 LAB — BASIC METABOLIC PANEL
Anion gap: 7 (ref 5–15)
BUN: 21 mg/dL — AB (ref 6–20)
CALCIUM: 9.4 mg/dL (ref 8.9–10.3)
CO2: 26 mmol/L (ref 22–32)
Chloride: 103 mmol/L (ref 101–111)
Creatinine, Ser: 1.7 mg/dL — ABNORMAL HIGH (ref 0.61–1.24)
GFR calc Af Amer: 49 mL/min — ABNORMAL LOW (ref >60)
GFR, EST NON AFRICAN AMERICAN: 42 mL/min — AB (ref >60)
Glucose, Bld: 107 mg/dL — ABNORMAL HIGH (ref 65–99)
POTASSIUM: 4.3 mmol/L (ref 3.5–5.1)
SODIUM: 136 mmol/L (ref 135–145)

## 2015-10-30 LAB — CBC
HEMATOCRIT: 36.8 % — AB (ref 39.0–52.0)
Hemoglobin: 12 g/dL — ABNORMAL LOW (ref 13.0–17.0)
MCH: 29.2 pg (ref 26.0–34.0)
MCHC: 32.6 g/dL (ref 30.0–36.0)
MCV: 89.5 fL (ref 78.0–100.0)
PLATELETS: 187 10*3/uL (ref 150–400)
RBC: 4.11 MIL/uL — ABNORMAL LOW (ref 4.22–5.81)
RDW: 14.1 % (ref 11.5–15.5)
WBC: 5.2 10*3/uL (ref 4.0–10.5)

## 2015-10-30 LAB — TYPE AND SCREEN
ABO/RH(D): A POS
Antibody Screen: NEGATIVE

## 2015-10-30 LAB — SURGICAL PCR SCREEN
MRSA, PCR: NEGATIVE
STAPHYLOCOCCUS AUREUS: NEGATIVE

## 2015-10-30 LAB — ABO/RH: ABO/RH(D): A POS

## 2015-10-30 NOTE — Progress Notes (Signed)
PCP - Elfredia Nevins Cardiologist - denies  Chest x-ray - not needed EKG - 9-27/10 - not heart history Stress Test - denies ECHO - 05/05/09 Cardiac Cath - denies    Patient denies shortness of breath, fever, cough and chest pain at PAT appointment  Patient spoke with me about an anesthesia event back in janurary 2011.  Patient staid that he had several surgeries relating to a postoperative infection that he contracted.  Patient stated that he was semi awake when he began to feel like his throat was closing up and he could not breath.  Patient stated that the anesthesiologist spoke with him about the incident and stated that he would use something different when he had his next surgery.    Spoke with Revonda Standard about it and she will look for records

## 2015-10-30 NOTE — Pre-Procedure Instructions (Signed)
    Gary Ferguson  10/30/2015      [id number] - [place], [place] - [address] AT [facility] [address] [place] [address] Phone: [phone] Fax: [phone]    Your procedure is scheduled on Monday, November 10, 2015  Report to [facility] Admitting at 1:30 P.M.  Call this number if you have problems the morning of surgery:  [phone]   Remember:  Do not eat food or drink liquids after midnight Sunday, November 09, 2015  Take these medicines the morning of surgery with A SIP OF WATER : Omeprazole ( Prilosec), if needed: Hydrocodone (Norco) for pain,  Allergy medication ( Claritin OR Allegra) Stop taking Aspirin,vitamins, fish oil and herbal medications. Do not take any NSAIDs ie: Ibuprofen, Advil, Naproxen   (Anaprox), BC and Goody Powder or any medication containing Aspirin; stop Monday November 03, 2015.  Do not wear jewelry, make-up or nail polish.  Do not wear lotions, powders, or perfumes.  You may not wear deoderant.  Do not shave 48 hours prior to surgery.  Men may shave face and neck.  Do not bring valuables to the hospital.  [facility] is not responsible for any belongings or valuables.  Contacts, dentures or bridgework may not be worn into surgery.  Leave your suitcase in the car.  After surgery it may be brought to your room.  For patients admitted to the hospital, discharge time will be determined by your treatment team.  Patients discharged the day of surgery will not be allowed to drive home.   Name and phone number of your driver:   Special instructions: Shower the night before surgery and the morning of surgery with CHG.  Please read over the following fact sheets that you were given. Pain Booklet, Coughing and Deep Breathing, Blood Transfusion Information, Total Joint Packet, MRSA Information and Surgical Site Infection Prevention

## 2015-11-03 NOTE — Progress Notes (Addendum)
Anesthesia Chart Review:  Pt is a 60 year old male scheduled for L total hip arthroplasty anterior approach on 11/10/2015 with Loreli Dollar, MD.   PCP is Elfredia Nevins, MD.   PMH includes:  DVT, GERD. Never smoker. BMI 27.   Anesthesia history: pt reports his "throat closed up" after anesthesia in 2011 at [facility]. Anesthesia record from 05/03/09 (and other dates) on chart for review. In brief, pt arrived to PACU c/o SOB. Developed inspiratory stidor. Tx with decadron, albuterol and racemic epi. Symptoms completely resolved. Laryngeal edema vs allergic reaction. Pt received platelets perioperatively for severely elevated PT; anesthesiologist speculated this could have been the cause of symptoms. Pt also experienced hypotension in PACU, thought to be due to vasodilatory shock likely from sepsis (surgery was for lumbar spine abscess).   Medications include: ASA, prilosec.   Preoperative labs reviewed.  Cr 1.7, BUN 21. Cr was 1.26 on 08/01/15 from labs at [facility] office. I reached out to PCP and Terie Purser, PA gave ok for pt to proceed with surgery. She noted "monitor renal function daily".   If no changes, I anticipate pt can proceed with surgery as scheduled.   Rica Mast, FNP-BC [facility] Stay [facility]/Anesthesiology Phone: [phone] 11/07/2015 4:15 PM

## 2015-11-10 ENCOUNTER — Inpatient Hospital Stay (HOSPITAL_COMMUNITY): Payer: Commercial Managed Care - HMO | Admitting: Certified Registered Nurse Anesthetist

## 2015-11-10 ENCOUNTER — Encounter (HOSPITAL_COMMUNITY): Payer: Self-pay | Admitting: Certified Registered Nurse Anesthetist

## 2015-11-10 ENCOUNTER — Inpatient Hospital Stay (HOSPITAL_COMMUNITY)
Admission: RE | Admit: 2015-11-10 | Discharge: 2015-11-12 | DRG: 470 | Disposition: A | Discharge status: Home Health | Payer: Commercial Managed Care - HMO | Source: Ambulatory Visit | Attending: Orthopedic Surgery | Admitting: Orthopedic Surgery

## 2015-11-10 ENCOUNTER — Inpatient Hospital Stay (HOSPITAL_COMMUNITY): Payer: Commercial Managed Care - HMO

## 2015-11-10 ENCOUNTER — Inpatient Hospital Stay (HOSPITAL_COMMUNITY): Payer: Commercial Managed Care - HMO | Admitting: Vascular Surgery

## 2015-11-10 ENCOUNTER — Encounter (HOSPITAL_COMMUNITY)
Admission: RE | Disposition: A | Discharge status: Home Health | Payer: Self-pay | Source: Ambulatory Visit | Attending: Orthopedic Surgery

## 2015-11-10 DIAGNOSIS — Z09 Encounter for follow-up examination after completed treatment for conditions other than malignant neoplasm: Secondary | ICD-10-CM

## 2015-11-10 DIAGNOSIS — M1612 Unilateral primary osteoarthritis, left hip: Secondary | ICD-10-CM | POA: Diagnosis present

## 2015-11-10 DIAGNOSIS — D62 Acute posthemorrhagic anemia: Secondary | ICD-10-CM | POA: Diagnosis not present

## 2015-11-10 DIAGNOSIS — Z86718 Personal history of other venous thrombosis and embolism: Secondary | ICD-10-CM | POA: Diagnosis not present

## 2015-11-10 DIAGNOSIS — K219 Gastro-esophageal reflux disease without esophagitis: Secondary | ICD-10-CM | POA: Diagnosis present

## 2015-11-10 DIAGNOSIS — Z7982 Long term (current) use of aspirin: Secondary | ICD-10-CM

## 2015-11-10 DIAGNOSIS — Z419 Encounter for procedure for purposes other than remedying health state, unspecified: Secondary | ICD-10-CM

## 2015-11-10 DIAGNOSIS — R110 Nausea: Secondary | ICD-10-CM | POA: Diagnosis not present

## 2015-11-10 DIAGNOSIS — M549 Dorsalgia, unspecified: Secondary | ICD-10-CM | POA: Diagnosis present

## 2015-11-10 DIAGNOSIS — G8929 Other chronic pain: Secondary | ICD-10-CM | POA: Diagnosis present

## 2015-11-10 HISTORY — PX: TOTAL HIP ARTHROPLASTY: SHX124

## 2015-11-10 SURGERY — ARTHROPLASTY, HIP, TOTAL, ANTERIOR APPROACH
Anesthesia: General | Site: Hip | Laterality: Left

## 2015-11-10 MED ORDER — SODIUM CHLORIDE 0.9 % IR SOLN
Status: DC | PRN
  Administered 2015-11-10: 3000 mL

## 2015-11-10 MED ORDER — HYDROMORPHONE HCL 1 MG/ML IJ SOLN
INTRAMUSCULAR | Status: AC
  Administered 2015-11-10: 0.5 mg via INTRAVENOUS
  Filled 2015-11-10: qty 1

## 2015-11-10 MED ORDER — ONDANSETRON HCL 4 MG/2ML IJ SOLN
INTRAMUSCULAR | Status: DC | PRN
  Administered 2015-11-10: 4 mg via INTRAVENOUS

## 2015-11-10 MED ORDER — LIDOCAINE 2% (20 MG/ML) 5 ML SYRINGE
INTRAMUSCULAR | Status: AC
  Filled 2015-11-10: qty 5

## 2015-11-10 MED ORDER — ACETAMINOPHEN 325 MG PO TABS: 650.0000 mg | ORAL_TABLET | Freq: Four times a day (QID) | ORAL | Status: DC | PRN

## 2015-11-10 MED ORDER — SODIUM CHLORIDE 0.9 % IV SOLN
INTRAVENOUS | Status: DC
  Administered 2015-11-10: 14:00:00 via INTRAVENOUS

## 2015-11-10 MED ORDER — SUGAMMADEX SODIUM 200 MG/2ML IV SOLN
INTRAVENOUS | Status: AC
  Filled 2015-11-10: qty 2

## 2015-11-10 MED ORDER — FENTANYL CITRATE (PF) 250 MCG/5ML IJ SOLN
INTRAMUSCULAR | Status: AC
  Filled 2015-11-10: qty 5

## 2015-11-10 MED ORDER — MIDAZOLAM HCL 2 MG/2ML IJ SOLN
INTRAMUSCULAR | Status: AC
  Filled 2015-11-10: qty 2

## 2015-11-10 MED ORDER — APIXABAN 2.5 MG PO TABS
2.5000 mg | ORAL_TABLET | Freq: Two times a day (BID) | ORAL | Status: DC
  Administered 2015-11-11 – 2015-11-12 (×3): 2.5 mg via ORAL
  Filled 2015-11-10 (×3): qty 1

## 2015-11-10 MED ORDER — CHLORHEXIDINE GLUCONATE 4 % EX LIQD: 60.0000 mL | Freq: Once | CUTANEOUS | Status: DC

## 2015-11-10 MED ORDER — METOCLOPRAMIDE HCL 5 MG PO TABS: 5.0000 mg | ORAL_TABLET | Freq: Three times a day (TID) | ORAL | Status: DC | PRN

## 2015-11-10 MED ORDER — MEPERIDINE HCL 25 MG/ML IJ SOLN
INTRAMUSCULAR | Status: AC
  Filled 2015-11-10: qty 1

## 2015-11-10 MED ORDER — CEFAZOLIN IN D5W 1 GM/50ML IV SOLN
INTRAVENOUS | Status: AC
  Filled 2015-11-10: qty 50

## 2015-11-10 MED ORDER — HYDROMORPHONE HCL 1 MG/ML IJ SOLN: 0.5000 mg | INTRAMUSCULAR | Status: DC | PRN

## 2015-11-10 MED ORDER — KETOROLAC TROMETHAMINE 30 MG/ML IJ SOLN
INTRAMUSCULAR | Status: AC
  Filled 2015-11-10: qty 1

## 2015-11-10 MED ORDER — HYDROMORPHONE HCL 1 MG/ML IJ SOLN
0.2500 mg | INTRAMUSCULAR | Status: DC | PRN
  Administered 2015-11-10 (×4): 0.5 mg via INTRAVENOUS

## 2015-11-10 MED ORDER — LORATADINE 10 MG PO TABS
10.0000 mg | ORAL_TABLET | Freq: Every day | ORAL | Status: DC
  Administered 2015-11-11: 10 mg via ORAL
  Filled 2015-11-10: qty 1

## 2015-11-10 MED ORDER — ALBUMIN HUMAN 5 % IV SOLN
INTRAVENOUS | Status: DC | PRN
  Administered 2015-11-10 (×2): via INTRAVENOUS

## 2015-11-10 MED ORDER — VANCOMYCIN HCL 1000 MG IV SOLR
INTRAVENOUS | Status: DC | PRN
  Administered 2015-11-10: 1000 mg via INTRAVENOUS

## 2015-11-10 MED ORDER — PHENOL 1.4 % MT LIQD: 1.0000 | OROMUCOSAL | Status: DC | PRN

## 2015-11-10 MED ORDER — MENTHOL 3 MG MT LOZG
1.0000 | LOZENGE | OROMUCOSAL | Status: DC | PRN
Start: 2015-11-10 — End: 2015-11-12

## 2015-11-10 MED ORDER — FENTANYL CITRATE (PF) 100 MCG/2ML IJ SOLN
INTRAMUSCULAR | Status: DC | PRN
  Administered 2015-11-10 (×7): 50 ug via INTRAVENOUS

## 2015-11-10 MED ORDER — PROPOFOL 10 MG/ML IV BOLUS
INTRAVENOUS | Status: AC
  Filled 2015-11-10: qty 20

## 2015-11-10 MED ORDER — MIDAZOLAM HCL 5 MG/5ML IJ SOLN
INTRAMUSCULAR | Status: DC | PRN
  Administered 2015-11-10: 2 mg via INTRAVENOUS

## 2015-11-10 MED ORDER — ONDANSETRON HCL 4 MG/2ML IJ SOLN: 4.0000 mg | Freq: Once | INTRAMUSCULAR | Status: DC | PRN

## 2015-11-10 MED ORDER — BUPIVACAINE-EPINEPHRINE (PF) 0.5% -1:200000 IJ SOLN
INTRAMUSCULAR | Status: AC
  Filled 2015-11-10: qty 30

## 2015-11-10 MED ORDER — METHOCARBAMOL 1000 MG/10ML IJ SOLN: 500.0000 mg | Freq: Four times a day (QID) | INTRAVENOUS | Status: DC | PRN

## 2015-11-10 MED ORDER — PHENYLEPHRINE HCL 10 MG/ML IJ SOLN
INTRAMUSCULAR | Status: DC | PRN
  Administered 2015-11-10: 80 ug via INTRAVENOUS
  Administered 2015-11-10 (×3): 40 ug via INTRAVENOUS
  Administered 2015-11-10 (×2): 80 ug via INTRAVENOUS
  Administered 2015-11-10: 40 ug via INTRAVENOUS
  Administered 2015-11-10 (×2): 80 ug via INTRAVENOUS
  Administered 2015-11-10: 40 ug via INTRAVENOUS

## 2015-11-10 MED ORDER — 0.9 % SODIUM CHLORIDE (POUR BTL) OPTIME
TOPICAL | Status: DC | PRN
  Administered 2015-11-10: 1000 mL

## 2015-11-10 MED ORDER — MEPERIDINE HCL 25 MG/ML IJ SOLN
6.2500 mg | INTRAMUSCULAR | Status: DC | PRN
  Administered 2015-11-10: 6.25 mg via INTRAVENOUS

## 2015-11-10 MED ORDER — HYDROCODONE-ACETAMINOPHEN 7.5-325 MG PO TABS: 1.0000 | ORAL_TABLET | ORAL | Status: DC | PRN

## 2015-11-10 MED ORDER — DOCUSATE SODIUM 100 MG PO CAPS
100.0000 mg | ORAL_CAPSULE | Freq: Two times a day (BID) | ORAL | Status: DC
  Administered 2015-11-10 – 2015-11-12 (×4): 100 mg via ORAL
  Filled 2015-11-10 (×4): qty 1

## 2015-11-10 MED ORDER — LACTATED RINGERS IV SOLN
INTRAVENOUS | Status: DC | PRN
  Administered 2015-11-10: 17:00:00 via INTRAVENOUS

## 2015-11-10 MED ORDER — PANTOPRAZOLE SODIUM 40 MG PO TBEC
40.0000 mg | DELAYED_RELEASE_TABLET | Freq: Every day | ORAL | Status: DC
Start: 2015-11-11 — End: 2015-11-12
  Administered 2015-11-11 – 2015-11-12 (×3): 40 mg via ORAL
  Filled 2015-11-10 (×3): qty 1

## 2015-11-10 MED ORDER — KETOROLAC TROMETHAMINE 30 MG/ML IJ SOLN
INTRAMUSCULAR | Status: DC | PRN
  Administered 2015-11-10: 30 mg via INTRAMUSCULAR

## 2015-11-10 MED ORDER — SODIUM CHLORIDE 0.9 % IV SOLN
INTRAVENOUS | Status: DC
  Administered 2015-11-10 – 2015-11-11 (×2): via INTRAVENOUS

## 2015-11-10 MED ORDER — METHOCARBAMOL 500 MG PO TABS: 500.0000 mg | ORAL_TABLET | Freq: Four times a day (QID) | ORAL | Status: DC | PRN

## 2015-11-10 MED ORDER — CEFAZOLIN SODIUM-DEXTROSE 2-4 GM/100ML-% IV SOLN
2.0000 g | INTRAVENOUS | Status: AC
  Administered 2015-11-10: 2 g via INTRAVENOUS
  Filled 2015-11-10: qty 100

## 2015-11-10 MED ORDER — SUGAMMADEX SODIUM 200 MG/2ML IV SOLN
INTRAVENOUS | Status: DC | PRN
  Administered 2015-11-10: 200 mg via INTRAVENOUS

## 2015-11-10 MED ORDER — PHENYLEPHRINE 40 MCG/ML (10ML) SYRINGE FOR IV PUSH (FOR BLOOD PRESSURE SUPPORT)
PREFILLED_SYRINGE | INTRAVENOUS | Status: AC
  Filled 2015-11-10: qty 10

## 2015-11-10 MED ORDER — ZOLPIDEM TARTRATE 5 MG PO TABS
5.0000 mg | ORAL_TABLET | Freq: Every evening | ORAL | Status: DC | PRN
  Administered 2015-11-12: 5 mg via ORAL
  Filled 2015-11-10: qty 1

## 2015-11-10 MED ORDER — DIPHENHYDRAMINE HCL 12.5 MG/5ML PO ELIX: 12.5000 mg | ORAL_SOLUTION | ORAL | Status: DC | PRN

## 2015-11-10 MED ORDER — ONDANSETRON HCL 4 MG/2ML IJ SOLN
INTRAMUSCULAR | Status: AC
  Filled 2015-11-10: qty 2

## 2015-11-10 MED ORDER — HYDROMORPHONE HCL 1 MG/ML IJ SOLN
INTRAMUSCULAR | Status: AC
Start: 2015-11-10 — End: 2015-11-10
  Administered 2015-11-10: 0.5 mg via INTRAVENOUS
  Filled 2015-11-10: qty 1

## 2015-11-10 MED ORDER — LACTATED RINGERS IV SOLN: INTRAVENOUS | Status: DC | PRN

## 2015-11-10 MED ORDER — ONDANSETRON HCL 4 MG PO TABS: 4.0000 mg | ORAL_TABLET | Freq: Four times a day (QID) | ORAL | Status: DC | PRN

## 2015-11-10 MED ORDER — SODIUM CHLORIDE 0.9 % IJ SOLN
INTRAMUSCULAR | Status: DC | PRN
  Administered 2015-11-10: 30 mL

## 2015-11-10 MED ORDER — SODIUM CHLORIDE 0.9 % IV SOLN
INTRAVENOUS | Status: DC | PRN
  Administered 2015-11-10: 1000 mL via INTRAMUSCULAR

## 2015-11-10 MED ORDER — LIDOCAINE HCL (CARDIAC) 20 MG/ML IV SOLN
INTRAVENOUS | Status: DC | PRN
Start: 2015-11-10 — End: 2015-11-10
  Administered 2015-11-10: 80 mg via INTRATRACHEAL
  Administered 2015-11-10: 100 mg via INTRAVENOUS

## 2015-11-10 MED ORDER — ACETAMINOPHEN 650 MG RE SUPP: 650.0000 mg | Freq: Four times a day (QID) | RECTAL | Status: DC | PRN

## 2015-11-10 MED ORDER — OXYCODONE HCL 5 MG PO TABS: 5.0000 mg | ORAL_TABLET | Freq: Once | ORAL | Status: DC | PRN

## 2015-11-10 MED ORDER — MAGNESIUM CITRATE PO SOLN
1.0000 | Freq: Once | ORAL | Status: DC | PRN
Start: 2015-11-10 — End: 2015-11-12

## 2015-11-10 MED ORDER — METOCLOPRAMIDE HCL 5 MG/ML IJ SOLN
5.0000 mg | Freq: Three times a day (TID) | INTRAMUSCULAR | Status: DC | PRN
  Filled 2015-11-10: qty 2

## 2015-11-10 MED ORDER — CIPROFLOXACIN HCL 500 MG PO TABS
500.0000 mg | ORAL_TABLET | Freq: Two times a day (BID) | ORAL | Status: DC
  Administered 2015-11-10 – 2015-11-12 (×4): 500 mg via ORAL
  Filled 2015-11-10 (×4): qty 1

## 2015-11-10 MED ORDER — SORBITOL 70 % SOLN: 30.0000 mL | Freq: Every day | Status: DC | PRN

## 2015-11-10 MED ORDER — ROCURONIUM BROMIDE 100 MG/10ML IV SOLN
INTRAVENOUS | Status: DC | PRN
  Administered 2015-11-10: 50 mg via INTRAVENOUS
  Administered 2015-11-10: 10 mg via INTRAVENOUS

## 2015-11-10 MED ORDER — ONDANSETRON HCL 4 MG/2ML IJ SOLN: 4.0000 mg | Freq: Four times a day (QID) | INTRAMUSCULAR | Status: DC | PRN

## 2015-11-10 MED ORDER — SENNA 8.6 MG PO TABS
2.0000 | ORAL_TABLET | Freq: Every day | ORAL | Status: DC
  Administered 2015-11-10 – 2015-11-11 (×2): 17.2 mg via ORAL
  Filled 2015-11-10 (×2): qty 2

## 2015-11-10 MED ORDER — DEXAMETHASONE SODIUM PHOSPHATE 10 MG/ML IJ SOLN
10.0000 mg | Freq: Once | INTRAMUSCULAR | Status: AC
  Administered 2015-11-11: 10 mg via INTRAVENOUS
  Filled 2015-11-10: qty 1

## 2015-11-10 MED ORDER — PROPOFOL 10 MG/ML IV BOLUS
INTRAVENOUS | Status: DC | PRN
  Administered 2015-11-10: 50 mg via INTRAVENOUS
  Administered 2015-11-10: 150 mg via INTRAVENOUS

## 2015-11-10 MED ORDER — OXYCODONE HCL 5 MG/5ML PO SOLN
5.0000 mg | Freq: Once | ORAL | Status: DC | PRN
Start: 2015-11-10 — End: 2015-11-10

## 2015-11-10 MED ORDER — VANCOMYCIN HCL IN DEXTROSE 1-5 GM/200ML-% IV SOLN
INTRAVENOUS | Status: AC
  Filled 2015-11-10: qty 200

## 2015-11-10 MED ORDER — CEFAZOLIN SODIUM-DEXTROSE 2-4 GM/100ML-% IV SOLN
2.0000 g | Freq: Three times a day (TID) | INTRAVENOUS | Status: AC
Start: 2015-11-10 — End: 2015-11-11
  Administered 2015-11-10 – 2015-11-11 (×2): 2 g via INTRAVENOUS
  Filled 2015-11-10 (×2): qty 100

## 2015-11-10 SURGICAL SUPPLY — 56 items
ADH SKN CLS APL DERMABOND .7 (GAUZE/BANDAGES/DRESSINGS) ×4
ALCOHOL ISOPROPYL (RUBBING) (MISCELLANEOUS) ×2 IMPLANT
BLADE SURG ROTATE 9660 (MISCELLANEOUS) IMPLANT
CAPT HIP TOTAL 2 ×2 IMPLANT
CHLORAPREP W/TINT 26ML (MISCELLANEOUS) ×2 IMPLANT
COVER SURGICAL LIGHT HANDLE (MISCELLANEOUS) ×2 IMPLANT
DERMABOND ADVANCED (GAUZE/BANDAGES/DRESSINGS) ×4
DERMABOND ADVANCED .7 DNX12 (GAUZE/BANDAGES/DRESSINGS) ×4 IMPLANT
DRAPE C-ARM 42X72 X-RAY (DRAPES) ×2 IMPLANT
DRAPE IMP U-DRAPE 54X76 (DRAPES) ×4 IMPLANT
DRAPE STERI IOBAN 125X83 (DRAPES) ×2 IMPLANT
DRAPE U-SHAPE 47X51 STRL (DRAPES) ×6 IMPLANT
DRSG AQUACEL AG ADV 3.5X10 (GAUZE/BANDAGES/DRESSINGS) ×2 IMPLANT
ELECT BLADE 4.0 EZ CLEAN MEGAD (MISCELLANEOUS) ×2
ELECT REM PT RETURN 9FT ADLT (ELECTROSURGICAL) ×2
ELECTRODE BLDE 4.0 EZ CLN MEGD (MISCELLANEOUS) ×1 IMPLANT
ELECTRODE REM PT RTRN 9FT ADLT (ELECTROSURGICAL) ×1 IMPLANT
EVACUATOR 1/8 PVC DRAIN (DRAIN) IMPLANT
GLOVE BIO SURGEON STRL SZ8.5 (GLOVE) ×4 IMPLANT
GLOVE BIOGEL PI IND STRL 8.5 (GLOVE) ×1 IMPLANT
GLOVE BIOGEL PI INDICATOR 8.5 (GLOVE) ×1
GOWN STRL REUS W/ TWL LRG LVL3 (GOWN DISPOSABLE) ×2 IMPLANT
GOWN STRL REUS W/TWL 2XL LVL3 (GOWN DISPOSABLE) ×2 IMPLANT
GOWN STRL REUS W/TWL LRG LVL3 (GOWN DISPOSABLE) ×4
HANDPIECE INTERPULSE COAX TIP (DISPOSABLE) ×2
HOOD PEEL AWAY FACE SHEILD DIS (HOOD) ×4 IMPLANT
KIT BASIN OR (CUSTOM PROCEDURE TRAY) ×2 IMPLANT
KIT ROOM TURNOVER OR (KITS) ×2 IMPLANT
MANIFOLD NEPTUNE II (INSTRUMENTS) ×2 IMPLANT
MARKER SKIN DUAL TIP RULER LAB (MISCELLANEOUS) ×4 IMPLANT
NEEDLE SPNL 18GX3.5 QUINCKE PK (NEEDLE) ×2 IMPLANT
NS IRRIG 1000ML POUR BTL (IV SOLUTION) ×2 IMPLANT
PACK TOTAL JOINT (CUSTOM PROCEDURE TRAY) ×2 IMPLANT
PACK UNIVERSAL I (CUSTOM PROCEDURE TRAY) ×2 IMPLANT
PAD ARMBOARD 7.5X6 YLW CONV (MISCELLANEOUS) ×4 IMPLANT
SAW OSC TIP CART 19.5X105X1.3 (SAW) ×2 IMPLANT
SEALER BIPOLAR AQUA 6.0 (INSTRUMENTS) IMPLANT
SET HNDPC FAN SPRY TIP SCT (DISPOSABLE) ×1 IMPLANT
SOLUTION BETADINE 4OZ (MISCELLANEOUS) ×2 IMPLANT
SUCTION FRAZIER HANDLE 10FR (MISCELLANEOUS) ×1
SUCTION TUBE FRAZIER 10FR DISP (MISCELLANEOUS) ×1 IMPLANT
SUT ETHIBOND NAB CT1 #1 30IN (SUTURE) ×4 IMPLANT
SUT MNCRL AB 3-0 PS2 18 (SUTURE) ×2 IMPLANT
SUT MON AB 2-0 CT1 36 (SUTURE) ×6 IMPLANT
SUT VIC AB 0 CT1 27 (SUTURE) ×1
SUT VIC AB 0 CT1 27XBRD ANBCTR (SUTURE) ×1 IMPLANT
SUT VIC AB 1 CT1 27 (SUTURE) ×2
SUT VIC AB 1 CT1 27XBRD ANBCTR (SUTURE) ×1 IMPLANT
SUT VIC AB 2-0 CT1 27 (SUTURE) ×1
SUT VIC AB 2-0 CT1 TAPERPNT 27 (SUTURE) ×1 IMPLANT
SUT VLOC 180 0 24IN GS25 (SUTURE) ×2 IMPLANT
SYR 50ML LL SCALE MARK (SYRINGE) ×2 IMPLANT
TOWEL OR 17X24 6PK STRL BLUE (TOWEL DISPOSABLE) ×2 IMPLANT
TOWEL OR 17X26 10 PK STRL BLUE (TOWEL DISPOSABLE) ×2 IMPLANT
TRAY FOLEY CATH 16FR SILVER (SET/KITS/TRAYS/PACK) IMPLANT
WATER STERILE IRR 1000ML POUR (IV SOLUTION) ×6 IMPLANT

## 2015-11-10 NOTE — Interval H&P Note (Signed)
History and Physical Interval Note:  11/10/2015 2:29 PM  Gary Ferguson  has presented today for surgery, with the diagnosis of djd left hip  The various methods of treatment have been discussed with the patient and family. After consideration of risks, benefits and other options for treatment, the patient has consented to  Procedure(s): TOTAL LEFT HIP ARTHROPLASTY ANTERIOR APPROACH (Left) as a surgical intervention .  The patient's history has been reviewed, patient examined, no change in status, stable for surgery.  I have reviewed the patient's chart and labs.  Questions were answered to the patient's satisfaction.     Laranda Burkemper, Cloyde Reams

## 2015-11-10 NOTE — Anesthesia Procedure Notes (Addendum)
Procedure Name: Intubation Date/Time: 11/10/2015 3:45 PM Performed by: Rise Patience T Pre-anesthesia Checklist: Patient identified, Emergency Drugs available, Suction available and Patient being monitored Patient Re-evaluated:Patient Re-evaluated prior to inductionOxygen Delivery Method: Circle System Utilized Preoxygenation: Pre-oxygenation with 100% oxygen Intubation Type: IV induction Ventilation: Mask ventilation without difficulty Laryngoscope Size: Mac and 4 Grade View: Grade I Tube type: Oral Tube size: 7.5 mm Number of attempts: 1 Airway Equipment and Method: Stylet Placement Confirmation: ETT inserted through vocal cords under direct vision,  positive ETCO2 and breath sounds checked- equal and bilateral Secured at: 23 cm Tube secured with: Tape Dental Injury: Teeth and Oropharynx as per pre-operative assessment  Comments: Intubation by Dr. Michelle Piper

## 2015-11-10 NOTE — Discharge Summary (Signed)
Physician Discharge Summary  Patient ID: Gary Ferguson MRN: [id number] DOB/AGE: [birth year] 60 y.o.  Admit date: 11/10/2015 Discharge date: 11/12/2015  Admission Diagnoses:  Primary osteoarthritis of left hip  Discharge Diagnoses:  Principal Problem:   Primary osteoarthritis of left hip   Past Medical History:  Diagnosis Date  . Arthritis   . Chronic back pain   . Complication of anesthesia    Patient said that after surgery his throat closed up in 2011  . GERD (gastroesophageal reflux disease)   . History of DVT of lower extremity   . Hx of blood clots 2010  . Kidney stones   . Macular atrophy, retinal    bilateral  . Torn rotator cuff    right shoulder    Surgeries: Procedure(s): TOTAL LEFT HIP ARTHROPLASTY ANTERIOR APPROACH on 11/10/2015   Consultants (if any):   Discharged Condition: Improved  Hospital Course: Gary Ferguson is an 60 y.o. male who was admitted 11/10/2015 with a diagnosis of Primary osteoarthritis of left hip and went to the operating room on 11/10/2015 and underwent the above named procedures.    He was given perioperative antibiotics:  Anti-infectives    Start     Dose/Rate Route Frequency Ordered Stop   11/10/15 2300  ceFAZolin (ANCEF) IVPB 2g/100 mL premix     2 g 200 mL/hr over 30 Minutes Intravenous Every 8 hours 11/10/15 2057 11/11/15 0658   11/10/15 2200  ciprofloxacin (CIPRO) tablet 500 mg     500 mg Oral 2 times daily 11/10/15 2057     11/10/15 1430  ceFAZolin (ANCEF) IVPB 2g/100 mL premix     2 g 200 mL/hr over 30 Minutes Intravenous On call to O.R. 11/10/15 1337 11/10/15 1558    .  He was given sequential compression devices, early ambulation, and apixaban for DVT prophylaxis.  He benefited maximally from the hospital stay and there were no complications.    Recent vital signs:  Vitals:   11/11/15 2156 11/12/15 0407  BP: 120/75 126/74  Pulse: 91 93  Resp:    Temp: 98.2 F (36.8 C) 98.3 F (36.8 C)    Recent laboratory studies:   Lab Results  Component Value Date   HGB 8.6 (L) 11/12/2015   HGB 8.8 (L) 11/11/2015   HGB 9.5 (L) 11/10/2015   Lab Results  Component Value Date   WBC 11.5 (H) 11/12/2015   PLT 158 11/12/2015   No results found for: INR Lab Results  Component Value Date   NA 137 11/11/2015   K 4.0 11/11/2015   CL 107 11/11/2015   CO2 26 11/11/2015   BUN 14 11/11/2015   CREATININE 1.34 (H) 11/11/2015   GLUCOSE 137 (H) 11/11/2015    Discharge Medications:     Medication List    STOP taking these medications   aspirin EC 81 MG EC tablet Generic drug:  aspirin     TAKE these medications   apixaban 2.5 MG Tabs tablet Commonly known as:  ELIQUIS Take 1 tablet (2.5 mg total) by mouth every 12 (twelve) hours.   ciprofloxacin 500 MG tablet Commonly known as:  CIPRO Take 500 mg by mouth 2 (two) times daily.   diphenhydramine-acetaminophen 25-500 MG Tabs tablet Commonly known as:  TYLENOL PM Take 1 tablet by mouth at bedtime as needed (for sleep).   docusate sodium 100 MG capsule Commonly known as:  COLACE Take 1 capsule (100 mg total) by mouth 2 (two) times daily.   fexofenadine 180 MG tablet  Commonly known as:  ALLEGRA Take 180 mg by mouth daily as needed for allergies.   HYDROcodone-acetaminophen 7.5-325 MG tablet Commonly known as:  NORCO Take 1-2 tablets by mouth every 4 (four) hours as needed (breakthrough pain). What changed:  how much to take  reasons to take this   multivitamin capsule Take 1 capsule by mouth daily.   naproxen sodium 220 MG tablet Commonly known as:  ANAPROX Take 440 mg by mouth daily. Reported on 09/24/2015   omeprazole 20 MG capsule Commonly known as:  PRILOSEC Take 20 mg by mouth every morning.   ondansetron 4 MG tablet Commonly known as:  ZOFRAN Take 1 tablet (4 mg total) by mouth every 6 (six) hours as needed for nausea.   senna 8.6 MG Tabs tablet Commonly known as:  SENOKOT Take 2 tablets (17.2 mg total) by mouth at bedtime.        Diagnostic Studies: Dg Pelvis Portable  Result Date: 11/10/2015 CLINICAL DATA:  Postop left hip replacement. EXAM: PORTABLE PELVIS 1-2 VIEWS COMPARISON:  Intra op films from earlier the same day. FINDINGS: Frontal view of the left hip shows the patient be status post total hip replacement. No evidence for immediate hardware complications. IMPRESSION: Status post left total hip replacement. Electronically Signed   By: Kennith Center M.D.   On: 11/10/2015 19:08   Dg C-arm 61-120 Min  Result Date: 11/10/2015 CLINICAL DATA:  Status post total hip arthroplasty. EXAM: OPERATIVE left HIP (WITH PELVIS IF PERFORMED) 2 VIEWS TECHNIQUE: Fluoroscopic spot image(s) were submitted for interpretation post-operatively. COMPARISON:  None. FINDINGS: Well seated components of a total left hip arthroplasty. No complicating features are demonstrated. IMPRESSION: Well seated components of a total left hip arthroplasty. Electronically Signed   By: Rudie Meyer M.D.   On: 11/10/2015 17:46   Dg Hip Operative Unilat W Or W/o Pelvis Left  Result Date: 11/10/2015 CLINICAL DATA:  Status post total hip arthroplasty. EXAM: OPERATIVE left HIP (WITH PELVIS IF PERFORMED) 2 VIEWS TECHNIQUE: Fluoroscopic spot image(s) were submitted for interpretation post-operatively. COMPARISON:  None. FINDINGS: Well seated components of a total left hip arthroplasty. No complicating features are demonstrated. IMPRESSION: Well seated components of a total left hip arthroplasty. Electronically Signed   By: Rudie Meyer M.D.   On: 11/10/2015 17:46    Disposition: 01-Home or Self Care  Discharge Instructions    Call MD / Call 911    Complete by:  As directed   If you experience chest pain or shortness of breath, CALL 911 and be transported to the hospital emergency room.  If you develope a fever above 101 F, pus (white drainage) or increased drainage or redness at the wound, or calf pain, call your surgeon's office.   Constipation Prevention     Complete by:  As directed   Drink plenty of fluids.  Prune juice may be helpful.  You may use a stool softener, such as Colace (over the counter) 100 mg twice a day.  Use MiraLax (over the counter) for constipation as needed.   Diet - low sodium heart healthy    Complete by:  As directed   Driving restrictions    Complete by:  As directed   No driving for 6 weeks   Increase activity slowly as tolerated    Complete by:  As directed   Lifting restrictions    Complete by:  As directed   No lifting for 6 weeks   TED hose    Complete by:  As directed   Use stockings (TED hose) for 2 weeks on both leg(s).  You may remove them at night for sleeping.      Follow-up Information    Dandrea Widdowson, Cloyde Reams, MD. Schedule an appointment as soon as possible for a visit in 2 weeks.   Specialty:  Orthopedic Surgery Why:  For wound re-check Contact information: [address] [place] [address] [phone]            Signed: Garnet Koyanagi 11/12/2015, 7:45 AM

## 2015-11-10 NOTE — Transfer of Care (Signed)
Immediate Anesthesia Transfer of Care Note  Patient: Gary Ferguson  Procedure(s) Performed: Procedure(s): TOTAL LEFT HIP ARTHROPLASTY ANTERIOR APPROACH (Left)  Patient Location: PACU  Anesthesia Type:General  Level of Consciousness: awake and alert   Airway & Oxygen Therapy: Patient Spontanous Breathing and Patient connected to nasal cannula oxygen  Post-op Assessment: Report given to RN, Post -op Vital signs reviewed and stable and Patient moving all extremities X 4  Post vital signs: Reviewed and stable  Last Vitals:  Vitals:   11/10/15 1340  BP: (!) 137/94  Pulse: 73  Resp: 20  Temp: 36.8 C    Last Pain:  Vitals:   11/10/15 1347  TempSrc:   PainSc: 3          Complications: No apparent anesthesia complications

## 2015-11-10 NOTE — Op Note (Signed)
OPERATIVE REPORT  SURGEON: [name], MD.   ASSISTANT: Hart Carwin, RNFA.  PREOPERATIVE DIAGNOSIS: Left hip arthritis.   POSTOPERATIVE DIAGNOSIS: Left hip arthritis.   PROCEDURE: Left total hip arthroplasty, anterior approach.   IMPLANTS: DePuy Tri Lock stem, size 5, hi offset. DePuy Pinnacle Cup, size 56 mm. DePuy Altrx liner, size 36 by 56 mm, +4 neutral. DePuy Biolox ceramic head ball, size 36 + 12 mm.  ANESTHESIA:  General  ESTIMATED BLOOD LOSS: 1200 mL.  ANTIBIOTICS: 2 g Ancef. 1 g vancomycin.   DRAINS: None.  COMPLICATIONS: None.   CONDITION: PACU - hemodynamically stable.  BRIEF CLINICAL NOTE: Gary Ferguson is a 61 y.o. male with a long-standing history of Left hip arthritis. After failing conservative management, the patient was indicated for total hip arthroplasty. The risks, benefits, and alternatives to the procedure were explained, and the patient elected to proceed.  PROCEDURE IN DETAIL: Surgical site was marked by myself. Once inside the operative room, general anesthesia was induced. The patient was then positioned on the Hana table. All bony prominences were well padded. The hip was prepped and draped in the normal sterile surgical fashion. A time-out was called verifying side and site of surgery. The patient received IV antibiotics within 60 minutes of beginning the procedure.  The direct anterior approach to the hip was performed through the Hueter interval. Lateral femoral circumflex vessels were treated with the Auqumantys. The anterior capsule was exposed and an inverted T capsulotomy was made.The femoral neck cut was made to the level of the templated cut. A corkscrew was placed into the head and the head was removed. The femoral head was found to have eburnated bone. The head was passed to the back table and was measured.  Acetabular exposure was achieved, and the pulvinar and labrum were excised. Sequental reaming of the acetabulum  was then performed up to a size 55 mm reamer. A 56 mm cup was then opened and impacted into place at approximately 40 degrees of abduction and 20 degrees of anteversion. I chose to augment the already acceptable press-fit stability with 2 6.5 mm cancellus bone screws. The final polyethylene liner was impacted into place and acetabular osteophytes were removed.   I then gained femoral exposure taking care to protect the abductors and greater trochanter. This was performed using standard external rotation, extension, and adduction. The capsule was peeled off the inner aspect of the greater trochanter, taking care to preserve the short external rotators. A cookie cutter was used to enter the femoral canal, and then the femoral canal finder was placed. Sequential broaching was performed up to a size 5. Calcar planer was used on the femoral neck remnant. I placed a hi offset neck and a trial head ball. The hip was reduced. Leg lengths and offset were checked fluoroscopically. The hip was dislocated and trial components were removed. The final implants were placed, and the hip was reduced.  Fluoroscopy was used to confirm component position and leg lengths. At 90 degrees of external rotation and full extension, the hip was stable to an anterior directed force.  The wound was copiously irrigated with a dilute betadine solution followed by normal saline. Marcaine solution was injected into the periarticular soft tissue. The wound was closed in layers using #1 Vicryl and V-Loc for the fascia, 2-0 Vicryl for the subcutaneous fat, 2-0 Monocryl for the deep dermal layer, 3-0 running Monocryl subcuticular stitch, and Dermabond for the skin. Once the glue was fully dried, an Aquacell Ag  dressing was applied. The patient was transported to the recovery room in stable condition. Sponge, needle, and instrument counts were correct at the end of the case x2. The patient tolerated the procedure well and there were  no known complications.

## 2015-11-10 NOTE — H&P (View-Only) (Signed)
TOTAL HIP ADMISSION H&P  Patient is admitted for left total hip arthroplasty.  Subjective:  Chief Complaint: left hip pain  HPI: Gary Ferguson, 60 y.o. male, has a history of pain and functional disability in the left hip(s) due to arthritis and patient has failed non-surgical conservative treatments for greater than 12 weeks to include NSAID's and/or analgesics, flexibility and strengthening excercises, use of assistive devices, weight reduction as appropriate and activity modification.  Onset of symptoms was gradual starting >10 years ago with rapidlly worsening course since that time.The patient noted no past surgery on the left hip(s).  Patient currently rates pain in the left hip at 10 out of 10 with activity. Patient has night pain, worsening of pain with activity and weight bearing, pain that interfers with activities of daily living, pain with passive range of motion and crepitus. Patient has evidence of subchondral cysts, subchondral sclerosis, periarticular osteophytes and joint space narrowing by imaging studies. This condition presents safety issues increasing the risk of falls. There is no current active infection.  There are no active problems to display for this patient.  Past Medical History:  Diagnosis Date  . Arthritis   . Chronic back pain   . GERD (gastroesophageal reflux disease)   . History of DVT of lower extremity   . Hx of blood clots 2010  . Macular atrophy, retinal    bilateral    Past Surgical History:  Procedure Laterality Date  . BACK SURGERY  1997  . BACK SURGERY  04/2009   3 separate surgies for infection   . COLONOSCOPY  07/21/2011   Procedure: COLONOSCOPY;  Surgeon: [name], MD;  Location: [facility];  Service: Endoscopy;  Laterality: N/A;  12:45 PM  . TONSILLECTOMY       (Not in a hospital admission) Allergies  Allergen Reactions  . Penicillins Other (See Comments)     Has patient had a PCN reaction causing immediate rash,  facial/tongue/throat swelling, SOB or lightheadedness with hypotension: unknown childhood allergy Has patient had a PCN reaction causing severe rash involving mucus membranes or skin necrosis: No Has patient had a PCN reaction that required hospitalization No Has patient had a PCN reaction occurring within the last 10 years: No If all of the above answers are "NO", then may proceed with Cephalosporin use.     Social History  Substance Use Topics  . Smoking status: Never Smoker  . Smokeless tobacco: Not on file  . Alcohol use No    Family History  Problem Relation Age of Onset  . Liver disease Mother   . Hypertension Father      Review of Systems  Constitutional: Negative.   HENT: Negative.   Eyes: Negative.   Respiratory: Negative.   Cardiovascular: Negative.   Gastrointestinal: Negative.   Genitourinary: Positive for frequency, hematuria and urgency.  Musculoskeletal: Positive for back pain and joint pain.  Skin: Negative.   Neurological: Negative.   Endo/Heme/Allergies: Negative.   Psychiatric/Behavioral: Negative.     Objective:  Physical Exam  Constitutional: He is oriented to person, place, and time. He appears well-developed and well-nourished.  HENT:  Head: Normocephalic and atraumatic.  Eyes: Conjunctivae and EOM are normal. Pupils are equal, round, and reactive to light.  Neck: Neck supple.  Cardiovascular: Normal rate, regular rhythm and intact distal pulses.   Respiratory: Effort normal and breath sounds normal. No respiratory distress.  GI: Soft. Bowel sounds are normal. He exhibits no distension.  Genitourinary:  Genitourinary Comments: deferred  Musculoskeletal:       Left hip: He exhibits decreased range of motion and bony tenderness.  Neurological: He is alert and oriented to person, place, and time. He has normal reflexes.  Skin: Skin is warm and dry.  Psychiatric: He has a normal mood and affect. His behavior is normal. Judgment and thought content  normal.    Vital signs in last 24 hours: @VSRANGES @  Labs:   Estimated body mass index is 27.88 kg/m as calculated from the following:   Height as of 09/24/15: 5\' 11"  (1.803 m).   Weight as of 09/24/15: 90.7 kg (199 lb 14.4 oz).   Imaging Review Plain radiographs demonstrate severe degenerative joint disease of the left hip(s). The bone quality appears to be adequate for age and reported activity level.  Assessment/Plan:  End stage arthritis, left hip(s)  The patient history, physical examination, clinical judgement of the provider and imaging studies are consistent with end stage degenerative joint disease of the left hip(s) and total hip arthroplasty is deemed medically necessary. The treatment options including medical management, injection therapy, arthroscopy and arthroplasty were discussed at length. The risks and benefits of total hip arthroplasty were presented and reviewed. The risks due to aseptic loosening, infection, stiffness, dislocation/subluxation,  thromboembolic complications and other imponderables were discussed.  The patient acknowledged the explanation, agreed to proceed with the plan and consent was signed. Patient is being admitted for inpatient treatment for surgery, pain control, PT, OT, prophylactic antibiotics, VTE prophylaxis, progressive ambulation and ADL's and discharge planning.The patient is planning to be discharged home with home health services

## 2015-11-10 NOTE — Anesthesia Preprocedure Evaluation (Addendum)
Anesthesia Evaluation  Patient identified by MRN, date of birth, ID band Patient awake    Reviewed: Allergy & Precautions, NPO status , Patient's Chart, lab work & pertinent test results  Airway Mallampati: II  TM Distance: >3 FB Neck ROM: Full    Dental  (+) Dental Advisory Given, Teeth Intact   Pulmonary    Pulmonary exam normal        Cardiovascular Normal cardiovascular exam     Neuro/Psych    GI/Hepatic GERD  ,  Endo/Other    Renal/GU      Musculoskeletal  (+) Arthritis ,   Abdominal   Peds  Hematology   Anesthesia Other Findings   Reproductive/Obstetrics                           Anesthesia Physical Anesthesia Plan  ASA: II  Anesthesia Plan: General   Post-op Pain Management:    Induction: Intravenous  Airway Management Planned: Oral ETT  Additional Equipment:   Intra-op Plan:   Post-operative Plan: Extubation in OR  Informed Consent: I have reviewed the patients History and Physical, chart, labs and discussed the procedure including the risks, benefits and alternatives for the proposed anesthesia with the patient or authorized representative who has indicated his/her understanding and acceptance.   Dental advisory given  Plan Discussed with: CRNA and Surgeon  Anesthesia Plan Comments:        Anesthesia Quick Evaluation

## 2015-11-10 NOTE — Anesthesia Postprocedure Evaluation (Signed)
Anesthesia Post Note  Patient: Gary Ferguson  Procedure(s) Performed: Procedure(s) (LRB): TOTAL LEFT HIP ARTHROPLASTY ANTERIOR APPROACH (Left)  Patient location during evaluation: PACU Anesthesia Type: General Level of consciousness: awake and alert Pain management: pain level controlled Vital Signs Assessment: post-procedure vital signs reviewed and stable Respiratory status: spontaneous breathing, nonlabored ventilation, respiratory function stable and patient connected to nasal cannula oxygen Cardiovascular status: blood pressure returned to baseline and stable Postop Assessment: no signs of nausea or vomiting Anesthetic complications: no    Last Vitals:  Vitals:   11/10/15 2004 11/10/15 2039  BP: 122/78 126/81  Pulse: 97 88  Resp: 14 14  Temp:  36.8 C    Last Pain:  Vitals:   11/10/15 2039  TempSrc: Oral  PainSc:                  Dyshon Philbin A

## 2015-11-11 ENCOUNTER — Encounter (HOSPITAL_COMMUNITY): Payer: Self-pay | Admitting: General Practice

## 2015-11-11 LAB — CBC
HCT: 27.2 % — ABNORMAL LOW (ref 39.0–52.0)
Hemoglobin: 8.8 g/dL — ABNORMAL LOW (ref 13.0–17.0)
MCH: 29.2 pg (ref 26.0–34.0)
MCHC: 32.4 g/dL (ref 30.0–36.0)
MCV: 90.4 fL (ref 78.0–100.0)
PLATELETS: 176 10*3/uL (ref 150–400)
RBC: 3.01 MIL/uL — AB (ref 4.22–5.81)
RDW: 14.4 % (ref 11.5–15.5)
WBC: 9.1 10*3/uL (ref 4.0–10.5)

## 2015-11-11 LAB — POCT I-STAT EG7
Acid-base deficit: 3 mmol/L — ABNORMAL HIGH (ref 0.0–2.0)
BICARBONATE: 22.2 meq/L (ref 20.0–24.0)
Calcium, Ion: 1.12 mmol/L (ref 1.12–1.23)
HCT: 28 % — ABNORMAL LOW (ref 39.0–52.0)
HEMOGLOBIN: 9.5 g/dL — AB (ref 13.0–17.0)
O2 Saturation: 97 %
PCO2 VEN: 39.3 mmHg — AB (ref 45.0–50.0)
PO2 VEN: 96 mmHg — AB (ref 31.0–45.0)
Potassium: 3.3 mmol/L — ABNORMAL LOW (ref 3.5–5.1)
Sodium: 142 mmol/L (ref 135–145)
TCO2: 23 mmol/L (ref 0–100)
pH, Ven: 7.355 — ABNORMAL HIGH (ref 7.250–7.300)

## 2015-11-11 LAB — BASIC METABOLIC PANEL
ANION GAP: 4 — AB (ref 5–15)
BUN: 14 mg/dL (ref 6–20)
CO2: 26 mmol/L (ref 22–32)
Calcium: 7.8 mg/dL — ABNORMAL LOW (ref 8.9–10.3)
Chloride: 107 mmol/L (ref 101–111)
Creatinine, Ser: 1.34 mg/dL — ABNORMAL HIGH (ref 0.61–1.24)
GFR calc Af Amer: >60 mL/min (ref >60)
GFR calc non Af Amer: 56 mL/min — ABNORMAL LOW (ref >60)
Glucose, Bld: 137 mg/dL — ABNORMAL HIGH (ref 65–99)
POTASSIUM: 4 mmol/L (ref 3.5–5.1)
SODIUM: 137 mmol/L (ref 135–145)

## 2015-11-11 MED ORDER — SENNA 8.6 MG PO TABS: 2.0000 | ORAL_TABLET | Freq: Every day | ORAL | 3 refills | Status: DC

## 2015-11-11 MED ORDER — HYDROCODONE-ACETAMINOPHEN 7.5-325 MG PO TABS: 1.0000 | ORAL_TABLET | ORAL | 0 refills | Status: DC | PRN

## 2015-11-11 MED ORDER — DOCUSATE SODIUM 100 MG PO CAPS: 100.0000 mg | ORAL_CAPSULE | Freq: Two times a day (BID) | ORAL | 3 refills | Status: DC

## 2015-11-11 MED ORDER — ONDANSETRON HCL 4 MG PO TABS: 4.0000 mg | ORAL_TABLET | Freq: Four times a day (QID) | ORAL | 0 refills | Status: DC | PRN

## 2015-11-11 MED ORDER — APIXABAN 2.5 MG PO TABS: 2.5000 mg | ORAL_TABLET | Freq: Two times a day (BID) | ORAL | 0 refills | Status: DC

## 2015-11-11 NOTE — Evaluation (Signed)
Occupational Therapy Evaluation Patient Details Name: Gary Ferguson MRN: [id number] DOB: 01/18/1956 Today's Date: 11/11/2015    History of Present Illness pt presents with L THR.  pt with hx of Bil Macular Atrophy and Back Surgeries.     Clinical Impression   Pt with decline in function and safety with ADLs and ADL mobility with decreased balance and activity tolerance. Pt would benefit from acute OT services to address impairments to increase level of function and safety    Follow Up Recommendations  Home health OT;Supervision - Intermittent    Equipment Recommendations  Other (comment) (ADL A/E kit)    Recommendations for Other Services       Precautions / Restrictions Precautions Precautions: Fall Precaution Comments: pt legally blind.  He can see large objects, but not details or reading. Restrictions Weight Bearing Restrictions: Yes LLE Weight Bearing: Weight bearing as tolerated      Mobility Bed Mobility Overal bed mobility: Needs Assistance Bed Mobility: Rolling;Sidelying to Sit Rolling: Supervision Sidelying to sit: Supervision       General bed mobility comments: pt up in recliner  Transfers Overall transfer level: Needs assistance Equipment used: Rolling walker (2 wheeled) Transfers: Sit to/from Stand Sit to Stand: Min guard         General transfer comment: Good UE use and no cueing needed.  pt does indicate mild dizziness and nausea with standing.      Balance Overall balance assessment: Needs assistance Sitting-balance support: No upper extremity supported;Feet supported Sitting balance-Leahy Scale: Good     Standing balance support: Single extremity supported;Bilateral upper extremity supported;During functional activity Standing balance-Leahy Scale: Poor                              ADL Overall ADL's : Needs assistance/impaired     Grooming: Wash/dry hands;Wash/dry face;Standing;Min guard   Upper Body Bathing: Set up    Lower Body Bathing: Moderate assistance   Upper Body Dressing : Set up   Lower Body Dressing: Moderate assistance   Toilet Transfer: Min guard;RW;Ambulation;Grab bars;Comfort height toilet;Cueing for safety   Toileting- Clothing Manipulation and Hygiene: Minimal assistance;Sit to/from stand   Tub/ Shower Transfer: 3 in 1;Grab bars;Rolling walker;Ambulation   Functional mobility during ADLs: Min guard;Cueing for safety General ADL Comments: Pt educated on ADL A/E and pt very interested in having at home; states he will have someone go to hospital gift shop to purcahse before d/c     Vision  legally blind, no change from baseline          Pertinent Vitals/Pain Pain Assessment: 0-10 Pain Score: 5  Pain Location: L hip after mobility Pain Descriptors / Indicators: Aching;Sore Pain Intervention(s): Monitored during session;Repositioned     Hand Dominance Right   Extremity/Trunk Assessment Upper Extremity Assessment Upper Extremity Assessment: Overall WFL for tasks assessed   Lower Extremity Assessment Lower Extremity Assessment: Defer to PT evaluation LLE Deficits / Details: Good ROM, but strength limited post-op.  Sensation intact.   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) Available Help at Discharge: Family;Friend(s);Available PRN/intermittently Type of Home: House Home Access: Stairs to enter Entrance Stairs-Number of Steps: 2 Entrance  Stairs-Rails: None Home Layout: One level     Bathroom Shower/Tub: Walk-in shower   Bathroom Toilet: Handicapped height     Home Equipment: Walker - 2 wheels;Shower seat;Toilet riser          Prior Functioning/Environment Level of Independence: Independent             OT Diagnosis: Acute pain   OT Problem List: Decreased activity tolerance;Decreased knowledge of use of DME or AE;Pain;Impaired vision/perception;Impaired balance (sitting and/or standing)   OT Treatment/Interventions: Self-care/ADL training;DME and/or AE instruction;Therapeutic activities;Patient/family education    OT Goals(Current goals can be found in the care plan section) Acute Rehab OT Goals Patient Stated Goal: go home OT Goal Formulation: With patient Time For Goal Achievement: 11/18/15 Potential to Achieve Goals: Good ADL Goals Pt Will Perform Grooming: with supervision;with set-up;standing Pt Will Perform Lower Body Bathing: with min assist;with adaptive equipment Pt Will Perform Lower Body Dressing: with min assist;with adaptive equipment Pt Will Transfer to Toilet: with supervision;ambulating;grab bars Pt Will Perform Toileting - Clothing Manipulation and hygiene: with min guard assist;with supervision;sit to/from stand Pt Will Perform Tub/Shower Transfer: with supervision;shower seat  OT Frequency: Min 2X/week   Barriers to D/C:    none                     End of Session Equipment Utilized During Treatment: Gait belt;Rolling walker;Other (comment) (3 in 1)  Activity Tolerance: Patient tolerated treatment well Patient left: in chair;with call bell/phone within reach   Time: 1016-1044 OT Time Calculation (min): 28 min Charges:  OT General Charges $OT Visit: 1 Procedure OT Evaluation $OT Eval Moderate Complexity: 1 Procedure OT Treatments $Therapeutic Activity: 8-22 mins G-Codes:    Spencer, Denise Jeanette 11/11/2015, 10:57 AM    

## 2015-11-11 NOTE — Progress Notes (Signed)
   Subjective:  Patient reports pain as mild to moderate.  Had some nausea this am, but no emesis. Denies Cp/SOB.  Objective:   VITALS:   Vitals:   11/10/15 2004 11/10/15 2039 11/11/15 0014 11/11/15 0534  BP: 122/78 126/81 125/86 123/79  Pulse: 97 88 92 84  Resp: 14 14 14 15   Temp:  98.2 F (36.8 C) 97.9 F (36.6 C) 98.4 F (36.9 C)  TempSrc:  Oral Oral Oral  SpO2: 95% 99% 98% 100%  Weight:      Height:        ABD soft Sensation intact distally Intact pulses distally Dorsiflexion/Plantar flexion intact Incision: dressing C/D/I Compartment soft   Lab Results  Component Value Date   WBC 9.1 11/11/2015   HGB 8.8 (L) 11/11/2015   HCT 27.2 (L) 11/11/2015   MCV 90.4 11/11/2015   PLT 176 11/11/2015   BMET    Component Value Date/Time   NA 137 11/11/2015 0329   K 4.0 11/11/2015 0329   CL 107 11/11/2015 0329   CO2 26 11/11/2015 0329   GLUCOSE 137 (H) 11/11/2015 0329   BUN 14 11/11/2015 0329   CREATININE 1.34 (H) 11/11/2015 0329   CALCIUM 7.8 (L) 11/11/2015 0329   GFRNONAA 56 (L) 11/11/2015 0329   GFRAA >60 11/11/2015 0329     Assessment/Plan: 1 Day Post-Op   Principal Problem:   Primary osteoarthritis of left hip   WBAT with walker DVT ppx: eliquis, SCDs, TEDs PT/OT PO pain control ABLA: hgb 8.8 today, monitor D/C likely tomorrow   Garnet Koyanagi 11/11/2015, 7:59 AM   [name], MD Cell [phone]

## 2015-11-11 NOTE — Progress Notes (Signed)
Physical Therapy Treatment Patient Details Name: Gary Ferguson MRN: [id number] DOB: February 01, 1956 Today's Date: 11/11/2015    History of Present Illness pt presents with L THR.  pt with hx of Bil Macular Atrophy and Back Surgeries.      PT Comments    Pt able to ambulate around the unit and has begun working on ambulation with a cane.  Discussed using a cane for inside the house and RW for outside until pt able to get more practice with the cane.  Pt is ready for D/C from PT stand point.  Will continue to follow if remains on acute.    Follow Up Recommendations  Home health PT;Supervision - Intermittent     Equipment Recommendations  None recommended by PT (pt to have son buy cane at [facility].)    Recommendations for Other Services       Precautions / Restrictions Precautions Precautions: Fall Precaution Comments: pt legally blind.  He can see large objects, but not details or reading. Restrictions Weight Bearing Restrictions: Yes LLE Weight Bearing: Weight bearing as tolerated    Mobility  Bed Mobility Overal bed mobility: Modified Independent             General bed mobility comments: pt up in recliner  Transfers Overall transfer level: Needs assistance Equipment used: Rolling walker (2 wheeled);Straight cane Transfers: Sit to/from Stand Sit to Stand: Supervision         General transfer comment: Trialed both RW and cane for mobility.  cues for UE use with cane.  Ambulation/Gait Ambulation/Gait assistance: Supervision;Min guard Ambulation Distance (Feet): 400 Feet (and 100) Assistive device: Rolling walker (2 wheeled);Straight cane Gait Pattern/deviations: Step-through pattern;Decreased stride length     General Gait Details: pt with much more fluid gait pattern and with step-through gait.  No c/o dizziness this pm.  Trialed pt ambulating with cane with pt demonstrating good safe use.  Discussed getting a cane for home.     Stairs             Wheelchair Mobility    Modified Rankin (Stroke Patients Only)       Balance Overall balance assessment: Needs assistance Sitting-balance support: No upper extremity supported;Feet supported Sitting balance-Leahy Scale: Good     Standing balance support: Single extremity supported;Bilateral upper extremity supported;No upper extremity supported;During functional activity Standing balance-Leahy Scale: Fair                      Cognition Arousal/Alertness: Awake/alert Behavior During Therapy: WFL for tasks assessed/performed Overall Cognitive Status: Within Functional Limits for tasks assessed                      Exercises      General Comments        Pertinent Vitals/Pain Pain Assessment: 0-10 Pain Score: 4  Pain Location: L hip Pain Descriptors / Indicators: Aching Pain Intervention(s): Monitored during session;Repositioned    Home Living Family/patient expects to be discharged to:: Private residence Living Arrangements: Spouse/significant other Available Help at Discharge: Family;Friend(s);Available PRN/intermittently Type of Home: House Home Access: Stairs to enter Entrance Stairs-Rails: None Home Layout: One level Home Equipment: Environmental consultant - 2 wheels;Shower seat;Toilet riser      Prior Function Level of Independence: Independent          PT Goals (current goals can now be found in the care plan section) Acute Rehab PT Goals Patient Stated Goal: go home PT Goal Formulation: With patient Time For  Goal Achievement: 11/18/15 Potential to Achieve Goals: Good Progress towards PT goals: Progressing toward goals    Frequency  7X/week    PT Plan Current plan remains appropriate    Co-evaluation             End of Session Equipment Utilized During Treatment: Gait belt Activity Tolerance: Patient tolerated treatment well Patient left: in bed;with call bell/phone within reach     Time: 1328-1400 PT Time Calculation (min) (ACUTE  ONLY): 32 min  Charges:  $Gait Training: 23-37 mins                    G CodesSunny Schlein, Calvin [phone] 11/11/2015, 2:13 PM

## 2015-11-11 NOTE — Care Management Important Message (Signed)
Important Message  Patient Details  Name: Gary Ferguson MRN: [id number] Date of Birth: 1955/12/13   Medicare Important Message Given:  Yes    Bernadette Hoit 11/11/2015, 8:38 AM

## 2015-11-11 NOTE — Discharge Instructions (Signed)
°Dr. Brian Swinteck °Joint Replacement Specialist °[facility] °[address] °[place],  [address] °[phone] ° ° °TOTAL HIP REPLACEMENT POSTOPERATIVE DIRECTIONS ° ° ° °Hip Rehabilitation, Guidelines Following Surgery  ° °WEIGHT BEARING °Weight bearing as tolerated with assist device (walker, cane, etc) as directed, use it as long as suggested by your surgeon or therapist, typically at least 4-6 weeks. ° °The results of a hip operation are greatly improved after range of motion and muscle strengthening exercises. Follow all safety measures which are given to protect your hip. If any of these exercises cause increased pain or swelling in your joint, decrease the amount until you are comfortable again. Then slowly increase the exercises. Call your caregiver if you have problems or questions.  ° °HOME CARE INSTRUCTIONS  °Most of the following instructions are designed to prevent the dislocation of your new hip.  °Remove items at home which could result in a fall. This includes throw rugs or furniture in walking pathways.  °Continue medications as instructed at time of discharge. °· You may have some home medications which will be placed on hold until you complete the course of blood thinner medication. °· You may start showering once you are discharged home. Do not remove your dressing. °Do not put on socks or shoes without following the instructions of your caregivers.   °Sit on chairs with arms. Use the chair arms to help push yourself up when arising.  °Arrange for the use of a toilet seat elevator so you are not sitting low.  °· Walk with walker as instructed.  °You may resume a sexual relationship in one month or when given the OK by your caregiver.  °Use walker as long as suggested by your caregivers.  °You may put full weight on your legs and walk as much as is comfortable. °Avoid periods of inactivity such as sitting longer than an hour when not asleep. This helps prevent  blood clots.  °You may return to work once you are cleared by your surgeon.  °Do not drive a car for 6 weeks or until released by your surgeon.  °Do not drive while taking narcotics.  °Wear elastic stockings for two weeks following surgery during the day but you may remove then at night.  °Make sure you keep all of your appointments after your operation with all of your doctors and caregivers. You should call the office at the above phone number and make an appointment for approximately two weeks after the date of your surgery. °Please pick up a stool softener and laxative for home use as long as you are requiring pain medications. °· ICE to the affected hip every three hours for 30 minutes at a time and then as needed for pain and swelling. Continue to use ice on the hip for pain and swelling from surgery. You may notice swelling that will progress down to the foot and ankle.  This is normal after surgery.  Elevate the leg when you are not up walking on it.   °It is important for you to complete the blood thinner medication as prescribed by your doctor. °· Continue to use the breathing machine which will help keep your temperature down.  It is common for your temperature to cycle up and down following surgery, especially at night when you are not up moving around and exerting yourself.  The breathing machine keeps your lungs expanded and your temperature down. ° °RANGE OF MOTION AND STRENGTHENING EXERCISES  °These exercises are   designed to help you keep full movement of your hip joint. Follow your caregiver's or physical therapist's instructions. Perform all exercises about fifteen times, three times per day or as directed. Exercise both hips, even if you have had only one joint replacement. These exercises can be done on a training (exercise) mat, on the floor, on a table or on a bed. Use whatever works the best and is most comfortable for you. Use music or television while you are exercising so that the exercises  are a pleasant break in your day. This will make your life better with the exercises acting as a break in routine you can look forward to.  Lying on your back, slowly slide your foot toward your buttocks, raising your knee up off the floor. Then slowly slide your foot back down until your leg is straight again.  Lying on your back spread your legs as far apart as you can without causing discomfort.  Lying on your side, raise your upper leg and foot straight up from the floor as far as is comfortable. Slowly lower the leg and repeat.  Lying on your back, tighten up the muscle in the front of your thigh (quadriceps muscles). You can do this by keeping your leg straight and trying to raise your heel off the floor. This helps strengthen the largest muscle supporting your knee.  Lying on your back, tighten up the muscles of your buttocks both with the legs straight and with the knee bent at a comfortable angle while keeping your heel on the floor.   SKILLED REHAB INSTRUCTIONS: If the patient is transferred to a skilled rehab facility following release from the hospital, a list of the current medications will be sent to the facility for the patient to continue.  When discharged from the skilled rehab facility, please have the facility set up the patient's Home Health Physical Therapy prior to being released. Also, the skilled facility will be responsible for providing the patient with their medications at time of release from the facility to include their pain medication and their blood thinner medication. If the patient is still at the rehab facility at time of the two week follow up appointment, the skilled rehab facility will also need to assist the patient in arranging follow up appointment in our office and any transportation needs.  MAKE SURE YOU:  Understand these instructions.  Will watch your condition.  Will get help right away if you are not doing well or get worse.  Pick up stool softner and  laxative for home use following surgery while on pain medications. Do not remove your dressing. The dressing is waterproof--it is OK to take showers. Continue to use ice for pain and swelling after surgery. Do not use any lotions or creams on the incision until instructed by your surgeon. Total Hip Protocol.   Information on my medicine - ELIQUIS (apixaban)  This medication education was reviewed with me or my healthcare representative as part of my discharge preparation.  The pharmacist that spoke with me during my hospital stay was:  Armandina Stammer, [facility]  Why was Eliquis prescribed for you? Eliquis was prescribed for you to reduce the risk of blood clots forming after orthopedic surgery.    What do You need to know about Eliquis? Take your Eliquis TWICE DAILY - one tablet in the morning and one tablet in the evening with or without food.  It would be best to take the dose about the same time each day.  If you have difficulty swallowing the tablet whole please discuss with your pharmacist how to take the medication safely.  Take Eliquis exactly as prescribed by your doctor and DO NOT stop taking Eliquis without talking to the doctor who prescribed the medication.  Stopping without other medication to take the place of Eliquis may increase your risk of developing a clot.  After discharge, you should have regular check-up appointments with your healthcare provider that is prescribing your Eliquis.  What do you do if you miss a dose? If a dose of ELIQUIS is not taken at the scheduled time, take it as soon as possible on the same day and twice-daily administration should be resumed.  The dose should not be doubled to make up for a missed dose.  Do not take more than one tablet of ELIQUIS at the same time.  Important Safety Information A possible side effect of Eliquis is bleeding. You should call your healthcare provider right away if you experience any of the  following: ? Bleeding from an injury or your nose that does not stop. ? Unusual colored urine (red or dark brown) or unusual colored stools (red or black). ? Unusual bruising for unknown reasons. ? A serious fall or if you hit your head (even if there is no bleeding).  Some medicines may interact with Eliquis and might increase your risk of bleeding or clotting while on Eliquis. To help avoid this, consult your healthcare provider or pharmacist prior to using any new prescription or non-prescription medications, including herbals, vitamins, non-steroidal anti-inflammatory drugs (NSAIDs) and supplements.  This website has more information on Eliquis (apixaban): http://www.eliquis.com/eliquis/home

## 2015-11-11 NOTE — Evaluation (Signed)
Physical Therapy Evaluation Patient Details Name: Gary Ferguson MRN: [id number] DOB: [birth year] Today's Date: 11/11/2015   History of Present Illness  pt presents with L THR.  pt with hx of Bil Macular Atrophy and Back Surgeries.    Clinical Impression  Pt moving well despite c/o feeling dizzy and nauseated, which is not made worse with mobility.  Feel pt will be able to progress to D/C to home with continued therapies.  Will continue to follow.      Follow Up Recommendations Home health PT;Supervision - Intermittent    Equipment Recommendations  None recommended by PT    Recommendations for Other Services       Precautions / Restrictions Precautions Precautions: Fall Precaution Comments: pt legally blind.  He can see large objects, but not details or reading. Restrictions Weight Bearing Restrictions: Yes LLE Weight Bearing: Weight bearing as tolerated      Mobility  Bed Mobility Overal bed mobility: Needs Assistance Bed Mobility: Rolling;Sidelying to Sit Rolling: Supervision Sidelying to sit: Supervision       General bed mobility comments: pt performs log roll technique since his multple back surgeries.  No A or cueing needed, but definite use of bed rail.    Transfers Overall transfer level: Needs assistance Equipment used: Rolling walker (2 wheeled) Transfers: Sit to/from Stand Sit to Stand: Min guard         General transfer comment: Good UE use and no cueing needed.  pt does indicate mild dizziness and nausea with standing.    Ambulation/Gait Ambulation/Gait assistance: Min guard Ambulation Distance (Feet): 100 Feet Assistive device: Rolling walker (2 wheeled) Gait Pattern/deviations: Step-through pattern;Decreased stride length     General Gait Details: pt moves slowly and indicates mild dizziness and nausea, but not worse with ambulation.  pt able to perform step-through gait.    Stairs            Wheelchair Mobility    Modified Rankin  (Stroke Patients Only)       Balance Overall balance assessment: Needs assistance Sitting-balance support: No upper extremity supported;Feet supported Sitting balance-Leahy Scale: Good     Standing balance support: Single extremity supported;Bilateral upper extremity supported;During functional activity Standing balance-Leahy Scale: Poor                               Pertinent Vitals/Pain Pain Assessment: 0-10 Pain Score: 4  Pain Location: L Hip Pain Descriptors / Indicators: Aching Pain Intervention(s): Monitored during session;Repositioned    Home Living Family/patient expects to be discharged to:: Private residence Living Arrangements: Alone Available Help at Discharge: Family;Friend(s);Available PRN/intermittently Type of Home: House Home Access: Stairs to enter Entrance Stairs-Rails: None Entrance Stairs-Number of Steps: 2 Home Layout: One level Home Equipment: Walker - 2 wheels;Shower seat;Toilet riser      Prior Function Level of Independence: Independent               Hand Dominance        Extremity/Trunk Assessment   Upper Extremity Assessment: Defer to OT evaluation           Lower Extremity Assessment: LLE deficits/detail   LLE Deficits / Details: Good ROM, but strength limited post-op.  Sensation intact.    Cervical / Trunk Assessment: Normal  Communication   Communication: No difficulties  Cognition Arousal/Alertness: Awake/alert Behavior During Therapy: WFL for tasks assessed/performed Overall Cognitive Status: Within Functional Limits for tasks assessed  General Comments      Exercises Total Joint Exercises Ankle Circles/Pumps: AROM;Both;10 reps Quad Sets: AROM;Both;10 reps Hip ABduction/ADduction: AAROM;Left;10 reps Long Arc Quad: AROM;Both;10 reps      Assessment/Plan    PT Assessment Patient needs continued PT services  PT Diagnosis Abnormality of gait;Acute pain   PT Problem  List Decreased strength;Decreased activity tolerance;Decreased balance;Decreased mobility;Decreased knowledge of use of DME  PT Treatment Interventions DME instruction;Gait training;Stair training;Functional mobility training;Therapeutic activities;Therapeutic exercise;Balance training;Patient/family education   PT Goals (Current goals can be found in the Care Plan section) Acute Rehab PT Goals Patient Stated Goal: Home PT Goal Formulation: With patient Time For Goal Achievement: 11/18/15 Potential to Achieve Goals: Good    Frequency 7X/week   Barriers to discharge        Co-evaluation               End of Session Equipment Utilized During Treatment: Gait belt Activity Tolerance: Patient tolerated treatment well Patient left: in chair;with call bell/phone within reach Nurse Communication: Mobility status         Time: 8185-9093 PT Time Calculation (min) (ACUTE ONLY): 31 min   Charges:   PT Evaluation $PT Eval Moderate Complexity: 1 Procedure PT Treatments $Gait Training: 8-22 mins   PT G CodesSunny Schlein, [place] [phone] 11/11/2015, 10:12 AM

## 2015-11-12 LAB — CBC
HCT: 25.9 % — ABNORMAL LOW (ref 39.0–52.0)
HEMOGLOBIN: 8.6 g/dL — AB (ref 13.0–17.0)
MCH: 29.7 pg (ref 26.0–34.0)
MCHC: 33.2 g/dL (ref 30.0–36.0)
MCV: 89.3 fL (ref 78.0–100.0)
PLATELETS: 158 10*3/uL (ref 150–400)
RBC: 2.9 MIL/uL — ABNORMAL LOW (ref 4.22–5.81)
RDW: 14.3 % (ref 11.5–15.5)
WBC: 11.5 10*3/uL — AB (ref 4.0–10.5)

## 2015-11-12 NOTE — Progress Notes (Signed)
Physical Therapy Treatment Patient Details Name: Gary Ferguson MRN: [id number] DOB: 05-25-1955 Today's Date: 11/12/2015    History of Present Illness pt presents with L THR.  pt with hx of Bil Macular Atrophy and Back Surgeries.      PT Comments    PTA reviewed mobility and educated patient on stair training.  Pt performed stair training with cues for safety.  Pt impulsive but steady.  Informed RN that patient is ready for d/c at this time.    Follow Up Recommendations  Home health PT;Supervision - Intermittent     Equipment Recommendations  None recommended by PT (reports will buy cane at [employer].  )    Recommendations for Other Services       Precautions / Restrictions Precautions Precautions: Fall Precaution Comments: pt legally blind.  He can see large objects, but not details or reading. Restrictions Weight Bearing Restrictions: Yes LLE Weight Bearing: Weight bearing as tolerated    Mobility  Bed Mobility               General bed mobility comments: pt up in recliner  Transfers Overall transfer level: Modified independent Equipment used: Rolling walker (2 wheeled)   Sit to Stand: Modified independent (Device/Increase time)         General transfer comment: Good technnique with RW for sit to stand transfers.    Ambulation/Gait Ambulation/Gait assistance: Modified independent (Device/Increase time) Ambulation Distance (Feet): 600 Feet Assistive device: Rolling walker (2 wheeled) Gait Pattern/deviations: Step-through pattern;Trunk flexed   Gait velocity interpretation: at or above normal speed for age/gender General Gait Details: Cues for upright posture and B foot clearance, minor shuffling noted.     Stairs Stairs: Yes Stairs assistance: Supervision Stair Management: One rail Left (reports he can hold door frame.  ) Number of Stairs: 2 General stair comments: Cues for sequencing and hand placement to maintain safety.    Wheelchair Mobility     Modified Rankin (Stroke Patients Only)       Balance Overall balance assessment: Needs assistance   Sitting balance-Leahy Scale: Good       Standing balance-Leahy Scale: Fair                      Cognition Arousal/Alertness: Awake/alert Behavior During Therapy: WFL for tasks assessed/performed Overall Cognitive Status: Within Functional Limits for tasks assessed                      Exercises Other Exercises Other Exercises: Reviewed frequency with patient.  Pt reports he feels confident with HEP at home. PTA filled out frequency for home use.      General Comments        Pertinent Vitals/Pain Pain Assessment: Faces Faces Pain Scale: Hurts a little bit Pain Location: L HIp Pain Descriptors / Indicators: Aching;Tightness Pain Intervention(s): Monitored during session;Repositioned    Home Living                      Prior Function            PT Goals (current goals can now be found in the care plan section) Acute Rehab PT Goals Patient Stated Goal: go home Potential to Achieve Goals: Good Progress towards PT goals: Progressing toward goals    Frequency  7X/week    PT Plan Current plan remains appropriate    Co-evaluation             End of Session  Equipment Utilized During Treatment: Gait belt Activity Tolerance: Patient tolerated treatment well Patient left: in bed;with call bell/phone within reach     Time: 0921-0934 PT Time Calculation (min) (ACUTE ONLY): 13 min  Charges:  $Gait Training: 8-22 mins                    G Codes:      Florestine Avers 11/22/2015, 9:40 AM  Joycelyn Rua, PTA pager [phone]

## 2015-11-12 NOTE — Progress Notes (Signed)
Pt discharge education and instructions completed with pt and family at bedside; both voices understanding and denies and any questions. Pt IV removed; hip incision dsg remains, clean, dry and intact; pt handed his prescriptions for Eliquis, colace, Norco, senna and zofran. Pt states he has all his home equipments. Pt discharge home with family to transport him home. Pt transported off unit via wheelchair with belongings and family at side. Dionne Bucy RN

## 2015-11-12 NOTE — Progress Notes (Signed)
   Subjective:  Patient reports pain as mild to moderate.  No c/o. Denies Cp/SOB.  Objective:   VITALS:   Vitals:   11/11/15 0534 11/11/15 1324 11/11/15 2156 11/12/15 0407  BP: 123/79 97/60 120/75 126/74  Pulse: 84 96 91 93  Resp: 15 16    Temp: 98.4 F (36.9 C) 100.1 F (37.8 C) 98.2 F (36.8 C) 98.3 F (36.8 C)  TempSrc: Oral  Oral Oral  SpO2: 100% 98% 99% 98%  Weight:      Height:        ABD soft Sensation intact distally Intact pulses distally Dorsiflexion/Plantar flexion intact Incision: dressing C/D/I Compartment soft   Lab Results  Component Value Date   WBC 11.5 (H) 11/12/2015   HGB 8.6 (L) 11/12/2015   HCT 25.9 (L) 11/12/2015   MCV 89.3 11/12/2015   PLT 158 11/12/2015   BMET    Component Value Date/Time   NA 137 11/11/2015 0329   K 4.0 11/11/2015 0329   CL 107 11/11/2015 0329   CO2 26 11/11/2015 0329   GLUCOSE 137 (H) 11/11/2015 0329   BUN 14 11/11/2015 0329   CREATININE 1.34 (H) 11/11/2015 0329   CALCIUM 7.8 (L) 11/11/2015 0329   GFRNONAA 56 (L) 11/11/2015 0329   GFRAA >60 11/11/2015 0329     Assessment/Plan: 2 Days Post-Op   Principal Problem:   Primary osteoarthritis of left hip   WBAT with walker DVT ppx: eliquis, SCDs, TEDs PT/OT PO pain control ABLA: hgb 8.6, stable, monitor D/C home today with HHPT   Zoe Creasman, Cloyde Reams 11/12/2015, 7:44 AM   [name], MD Cell [phone]

## 2016-09-29 ENCOUNTER — Other Ambulatory Visit: Payer: Self-pay | Admitting: Nephrology

## 2016-09-29 DIAGNOSIS — N183 Chronic kidney disease, stage 3 unspecified: Secondary | ICD-10-CM

## 2016-10-12 ENCOUNTER — Ambulatory Visit (HOSPITAL_COMMUNITY)
Admission: RE | Admit: 2016-10-12 | Discharge: 2016-10-12 | Disposition: A | Discharge status: Home / Self Care | Payer: Medicare PPO | Source: Ambulatory Visit | Attending: Nephrology | Admitting: Nephrology

## 2016-10-12 DIAGNOSIS — N183 Chronic kidney disease, stage 3 unspecified: Secondary | ICD-10-CM

## 2017-04-06 DIAGNOSIS — K219 Gastro-esophageal reflux disease without esophagitis: Secondary | ICD-10-CM | POA: Diagnosis not present

## 2017-04-06 DIAGNOSIS — E782 Mixed hyperlipidemia: Secondary | ICD-10-CM | POA: Diagnosis not present

## 2017-04-06 DIAGNOSIS — E6609 Other obesity due to excess calories: Secondary | ICD-10-CM | POA: Diagnosis not present

## 2017-04-06 DIAGNOSIS — M1991 Primary osteoarthritis, unspecified site: Secondary | ICD-10-CM | POA: Diagnosis not present

## 2017-04-06 DIAGNOSIS — Z6831 Body mass index (BMI) 31.0-31.9, adult: Secondary | ICD-10-CM | POA: Diagnosis not present

## 2017-04-13 DIAGNOSIS — R69 Illness, unspecified: Secondary | ICD-10-CM | POA: Diagnosis not present

## 2017-05-02 DIAGNOSIS — M13841 Other specified arthritis, right hand: Secondary | ICD-10-CM | POA: Diagnosis not present

## 2017-05-02 DIAGNOSIS — M13842 Other specified arthritis, left hand: Secondary | ICD-10-CM | POA: Diagnosis not present

## 2017-06-13 DIAGNOSIS — R69 Illness, unspecified: Secondary | ICD-10-CM | POA: Diagnosis not present

## 2017-06-22 DIAGNOSIS — Z1389 Encounter for screening for other disorder: Secondary | ICD-10-CM | POA: Diagnosis not present

## 2017-06-22 DIAGNOSIS — E782 Mixed hyperlipidemia: Secondary | ICD-10-CM | POA: Diagnosis not present

## 2017-06-22 DIAGNOSIS — R7309 Other abnormal glucose: Secondary | ICD-10-CM | POA: Diagnosis not present

## 2017-06-22 DIAGNOSIS — M1991 Primary osteoarthritis, unspecified site: Secondary | ICD-10-CM | POA: Diagnosis not present

## 2017-06-22 DIAGNOSIS — Z6829 Body mass index (BMI) 29.0-29.9, adult: Secondary | ICD-10-CM | POA: Diagnosis not present

## 2017-06-22 DIAGNOSIS — G894 Chronic pain syndrome: Secondary | ICD-10-CM | POA: Diagnosis not present

## 2017-06-22 DIAGNOSIS — K219 Gastro-esophageal reflux disease without esophagitis: Secondary | ICD-10-CM | POA: Diagnosis not present

## 2017-06-22 DIAGNOSIS — Z0001 Encounter for general adult medical examination with abnormal findings: Secondary | ICD-10-CM | POA: Diagnosis not present

## 2017-06-22 DIAGNOSIS — E748 Other specified disorders of carbohydrate metabolism: Secondary | ICD-10-CM | POA: Diagnosis not present

## 2017-08-09 DIAGNOSIS — Z791 Long term (current) use of non-steroidal anti-inflammatories (NSAID): Secondary | ICD-10-CM | POA: Diagnosis not present

## 2017-08-09 DIAGNOSIS — J309 Allergic rhinitis, unspecified: Secondary | ICD-10-CM | POA: Diagnosis not present

## 2017-08-09 DIAGNOSIS — G8929 Other chronic pain: Secondary | ICD-10-CM | POA: Diagnosis not present

## 2017-08-09 DIAGNOSIS — Z833 Family history of diabetes mellitus: Secondary | ICD-10-CM | POA: Diagnosis not present

## 2017-08-09 DIAGNOSIS — K219 Gastro-esophageal reflux disease without esophagitis: Secondary | ICD-10-CM | POA: Diagnosis not present

## 2017-08-09 DIAGNOSIS — Z880 Allergy status to penicillin: Secondary | ICD-10-CM | POA: Diagnosis not present

## 2017-08-09 DIAGNOSIS — Z809 Family history of malignant neoplasm, unspecified: Secondary | ICD-10-CM | POA: Diagnosis not present

## 2017-08-09 DIAGNOSIS — Z823 Family history of stroke: Secondary | ICD-10-CM | POA: Diagnosis not present

## 2017-08-09 DIAGNOSIS — Z96649 Presence of unspecified artificial hip joint: Secondary | ICD-10-CM | POA: Diagnosis not present

## 2017-11-11 DIAGNOSIS — M1612 Unilateral primary osteoarthritis, left hip: Secondary | ICD-10-CM | POA: Diagnosis not present

## 2017-12-08 DIAGNOSIS — M1991 Primary osteoarthritis, unspecified site: Secondary | ICD-10-CM | POA: Diagnosis not present

## 2017-12-08 DIAGNOSIS — E663 Overweight: Secondary | ICD-10-CM | POA: Diagnosis not present

## 2017-12-08 DIAGNOSIS — R600 Localized edema: Secondary | ICD-10-CM | POA: Diagnosis not present

## 2017-12-08 DIAGNOSIS — Z6829 Body mass index (BMI) 29.0-29.9, adult: Secondary | ICD-10-CM | POA: Diagnosis not present

## 2017-12-08 DIAGNOSIS — I83019 Varicose veins of right lower extremity with ulcer of unspecified site: Secondary | ICD-10-CM | POA: Diagnosis not present

## 2017-12-08 DIAGNOSIS — I8312 Varicose veins of left lower extremity with inflammation: Secondary | ICD-10-CM | POA: Diagnosis not present

## 2017-12-08 DIAGNOSIS — M8668 Other chronic osteomyelitis, other site: Secondary | ICD-10-CM | POA: Diagnosis not present

## 2017-12-08 DIAGNOSIS — N182 Chronic kidney disease, stage 2 (mild): Secondary | ICD-10-CM | POA: Diagnosis not present

## 2017-12-08 DIAGNOSIS — I872 Venous insufficiency (chronic) (peripheral): Secondary | ICD-10-CM | POA: Diagnosis not present

## 2017-12-08 DIAGNOSIS — K219 Gastro-esophageal reflux disease without esophagitis: Secondary | ICD-10-CM | POA: Diagnosis not present

## 2018-04-11 ENCOUNTER — Ambulatory Visit (HOSPITAL_COMMUNITY): Payer: Medicare HMO | Attending: Family Medicine

## 2018-04-11 ENCOUNTER — Encounter (HOSPITAL_COMMUNITY): Payer: Self-pay

## 2018-04-11 ENCOUNTER — Other Ambulatory Visit: Payer: Self-pay

## 2018-04-11 DIAGNOSIS — S91001A Unspecified open wound, right ankle, initial encounter: Secondary | ICD-10-CM | POA: Insufficient documentation

## 2018-04-11 DIAGNOSIS — L97319 Non-pressure chronic ulcer of right ankle with unspecified severity: Secondary | ICD-10-CM | POA: Insufficient documentation

## 2018-04-11 NOTE — Therapy (Signed)
[facility] [address] [place], [place], [address] Phone: [phone]   Fax:  [phone]  Wound Care Evaluation  Patient Details  Name: Gary Ferguson MRN: [id number] Date of Birth: 05/23/1955 Referring Provider (Gary Ferguson): Avis EpleyJackson, Samantha J, [place]   Encounter Date: 04/11/2018  Gary Ferguson End of Session - 04/11/18 1218    Visit Number  1    Number of Visits  13    Date for Gary Ferguson Re-Evaluation  05/23/18    Authorization Type  [employer] Medicare HMO (no auth required, no visit limit, $35 copay)    Authorization Time Period  04/11/2018 - 05/26/2018    Authorization - Visit Number  1    Authorization - Number of Visits  10    Gary Ferguson Start Time  0947    Gary Ferguson Stop Time  1034    Gary Ferguson Time Calculation (min)  47 min    Activity Tolerance  Patient tolerated treatment well;Patient limited by pain    Behavior During Therapy  [facility] for tasks assessed/performed       Past Medical History:  Diagnosis Date  . Arthritis   . Chronic back pain   . Complication of anesthesia    Patient said that after surgery his throat closed up in 2011  . GERD (gastroesophageal reflux disease)   . History of DVT of lower extremity   . Hx of blood clots 2010  . Kidney stones   . Macular atrophy, retinal    bilateral  . Torn rotator cuff    right shoulder    Past Surgical History:  Procedure Laterality Date  . BACK SURGERY  1997  . BACK SURGERY  04/2009   3 separate surgies for infection   . COLONOSCOPY  07/21/2011   Procedure: COLONOSCOPY;  Surgeon: Corbin Adeobert M Rourk, MD;  [facility];  Service: Endoscopy;  Laterality: N/A;  12:45 PM  . TONSILLECTOMY    . TOTAL HIP ARTHROPLASTY Left 11/10/2015  . TOTAL HIP ARTHROPLASTY Left 11/10/2015   Procedure: TOTAL LEFT HIP ARTHROPLASTY ANTERIOR APPROACH;  Surgeon: Samson FredericBrian Swinteck, MD;  Location: [facility] OR;  Service: Orthopedics;  Laterality: Left;    There were no vitals filed for this visit.  Subjective Assessment - 04/11/18 1156     Subjective  Gary Ferguson reports he has been experiencing Rt LE swelling for years, and states it started after he had a DVT ~9.5 years ago in his Rt leg. He states he has work compression stocking before many years ago but does not feel that they helped. He reports this is the first wound he has had and that he first noticed it the first weekend of August 2019. He reports he went to the beach the week before but does not recall scraping or cutting his ankle while there. he cared for it himself initially; washed with soap and water, and applied antibiotic ointment. He reports it hurt when it became dry and he tried to keep it covered with ointment and a band aid. He saw Dr. Sherwood GamblerFusco in September and understood him to say the ulcer developed form the swelling in his leg due to venous circulation problems. He was instructed to continue caring for it as he had been and now has been referred to skilled wound care as it has gotten bigger. He reports it hurts throughout the day and he finds at the end of the day it hurts the most. 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    Limitations  Walking    Currently in Pain?  Yes    Pain Score  4     Pain Location  Ankle    Pain Orientation  Right    Pain Descriptors / Indicators  Sore;Sharp    Pain Type  Chronic pain;Acute pain    Pain Onset  More than a month ago    Pain Frequency  Intermittent    Aggravating Factors   worse at end of the day, touching it    Pain Relieving Factors  unsure    Effect of Pain on Daily Activities  painful but still working full time on farm and doing daily activities.       [facility] Assessment - 04/11/18 0001      Assessment   Medical Diagnosis  Rt ankle venous ulcer    Onset Date/Surgical Date  11/05/17    Prior Therapy  self care      Precautions   Precautions  None    Precaution Comments  Patient is legally blind.      Restrictions   Weight Bearing Restrictions  No    Other Position/Activity  Restrictions  Patient is not able to drive due to vision impairments, and requires magnifying glass to read.      Balance Screen   Has the patient fallen in the past 6 months  No    Has the patient had a decrease in activity level because of a fear of falling?   No    Is the patient reluctant to leave their home because of a fear of falling?   No      Home Nurse, mental healthnvironment   Living Environment  Private residence    Living Arrangements  Parent   Dad   Available Help at Discharge  Family    Type of Home  House   Patient lives on farm with his father and works with him   Additional Comments  Patient [place] Stage 4 lung cancer and travels to all of his appointments with him.      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       Cognition   Overall Cognitive Status  Within Functional Limits for tasks assessed      Wound Therapy - 04/11/18 1156    Subjective  Gary Ferguson reports he has been experiencing Rt LE swelling for years, and states it started after he had a DVT ~9.5 years ago in his Rt leg. He states he has work compression stocking before many years ago but does not feel that they helped. He reports this is the first wound he has had and that he first noticed it the first weekend of August 2019. He reports he went to the beach the week before but does not recall scraping or cutting his ankle while there. he cared for it himself initially; washed with soap and water, and applied antibiotic ointment. He reports it hurt when it became dry and he tried to keep it covered with ointment and a band aid. He saw Dr. Sherwood GamblerFusco in September and understood him to say the ulcer developed form the swelling in his leg due to venous circulation problems. He was instructed to continue caring for it as he had been and now has been referred to  skilled wound care as it has  gotten bigger. He reports it hurts throughout the day and he finds at the end of the day it hurts the most. 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    Patient and Family Stated Goals  wound to heal and no longer to have pain    Date of Onset  11/05/17   approximate   Prior Treatments  self care    Pain Scale  0-10    Pain Onset  With Activity    Patients Stated Pain Goal  0    Pain Intervention(s)  Distraction;Emotional support    Evaluation and Treatment Procedures Explained to Patient/Family  Yes    Evaluation and Treatment Procedures  agreed to    Wound Properties Date First Assessed: 04/11/18 Time First Assessed: 0951 Wound Type: Venous stasis ulcer;Other (Comment) , presents like venous statis ulcer  Location: Ankle Location Orientation: Right;Medial Wound Description (Comments): superior to Rt medial malleoli Present on Admission: Yes   Dressing Type  Hydrocolloid;Pressure dressing   medihoney hydrocolloid, profore lite   Dressing Changed  New    Dressing Status  Clean;Dry;Intact    Dressing Change Frequency  PRN    Site / Wound Assessment  Yellow;Red;Pink;Dry    % Wound base Red or Granulating  40%    % Wound base Yellow/Fibrinous Exudate  25%    % Wound base Other/Granulation Tissue (Comment)  35%   pale smooth tissue   Peri-wound Assessment  Edema;Pink   red patchy irritationsuperior to wound   Wound Length (cm)  2.7 cm    Wound Width (cm)  1.8 cm    Wound Depth (cm)  --   unknown   Wound Surface Area (cm^2)  4.86 cm^2    Margins  Attached edges (approximated)    Drainage Amount  Minimal    Drainage Description  Serosanguineous    Treatment  Cleansed;Debridement (Selective);Pressure applied    Selective Debridement - Location  Rt medial ankle wound    Selective Debridement - Tools Used  Forceps    Selective Debridement - Tissue Removed  dried non-adhearnt slough and moist slough    Wound Therapy - Clinical Statement  Gary Ferguson presents for  evaluation of Right LE ulcer superior to right medial malleolus. The wound has been present for ~ 5 months and he has been treating it with antibiotic ointment and washing with soap and water. His ulcer presents with dried exudate that was easily debrided and loose slough is present in spots/pockets within wound bed. He is sensitive to cleansing and selective debridement of slough and has increased pain in wound bed, therefore debridement limited today. His Rt LE has some edema and ankle circumference is 1 inch greater than his Lt ankle. After cleansing and debridement wound was dressed with medihoney hydrocolloid to maintain moist environment and profore lite applied to address swelling. He will continue to benefit from skilled Gary Ferguson interventions to promote moist environment and reduce infection risk for wound healing.     Wound Therapy - Functional Problem List  difficulty walking    Factors Delaying/Impairing Wound Healing  Vascular compromise   history of DVT   Hydrotherapy Plan  Debridement;Dressing change;Patient/family education;Pulsatile lavage with suction    Wound Therapy - Frequency  2X / week    Wound Therapy - Current Recommendations  Gary Ferguson    Wound Plan  Continue cleansing/debridment. Gary Ferguson may benefit from pulsed lavage as he is sensitive to  selective debridement. Continue with compression wrapping.     Dressing   medihoney hydrocolloid, petrolleum applied ot periwound, profore lite        Objective measurements completed on examination: See above findings.     Gary Ferguson Education - 04/11/18 1217    Education Details  Educated on appropriate POC and initial goal of wound healing to create clean environment. Educated on where to purchase a cast cover to be able to shower with profore wrap on. Educated to remove if dressing becomes soaked or if he experiences severe pain in leg, toes, numbness and tingling in toes, or his Rt toes become blue or cold.    Person(s) Educated  Patient    Methods   Explanation    Comprehension  Verbalized understanding       Gary Ferguson Short Term Goals - 04/11/18 1452      Gary Ferguson SHORT TERM GOAL #1   Title  Wound to be full of healthy red granulating/non-granulating tissue with no slough or eschar to move from inflammatory phase into proliferative phase of wound healing.     Time  3    Period  Weeks    Status  New    Target Date  05/02/18        Gary Ferguson Long Term Goals - 04/11/18 1453      Gary Ferguson LONG TERM GOAL #1   Title  Wound will reduce in L x W x D by 50% or greater to indicate wound is approximating and continuing to progress through proliferative phase.    Time  6    Period  Weeks    Status  New    Target Date  05/23/18      Gary Ferguson LONG TERM GOAL #2   Title  Patient will verbalize understanding of importance of life long use of compression stockings to prevent ulcer recurence and will demosntrate proper donning/doffing of garment independently.    Time  6    Period  Weeks    Status  New        Plan - 04/11/18 1220    Clinical Impression Statement  Gary Ferguson presents for evaluation of Right LE ulcer superior to right medial malleolus. The wound has been present for ~ 5 months and he has been treating it with antibiotic ointment and washing with soap and water. His ulcer presents with dried exudate that was easily debrided and loose slough is present in spots/pockets within wound bed. He is sensitive to cleansing and selective debridement of slough and has increased pain in wound bed, therefore debridement limited today. His Rt LE has some edema and ankle circumference is 1 inch greater than his Lt ankle. After cleansing and debridement wound was dressed with medihoney hydrocolloid to maintain moist environment and profore lite applied to address swelling. He will continue to benefit from skilled Gary Ferguson interventions to promote moist environment and reduce infection risk for wound healing.     Clinical Presentation  Stable    Clinical Decision Making  Low     Rehab Potential  Good    Gary Ferguson Frequency  2x / week    Gary Ferguson Duration  6 weeks    Gary Ferguson Treatment/Interventions  ADLs/Self Care Home Management;Patient/family education;Taping;Manual techniques;Other (comment)   skilled owund care inclduing selective debridement, pulsed lavage   Gary Ferguson Next Visit Plan  see above    Consulted and Agree with Plan of Care  Patient       Patient will benefit from skilled therapeutic intervention in order  to improve the following deficits and impairments:  Decreased skin integrity, Pain, Difficulty walking  Visit Diagnosis: Open wound of right ankle, initial encounter  Ankle ulcer, right, with unspecified severity [facility])    Problem List Patient Active Problem List   Diagnosis Date Noted  . Primary osteoarthritis of left hip 11/10/2015    Gary Ferguson, Gary Ferguson, Gary Ferguson Physical Therapist with [facility]  04/11/2018 2:58 PM    [facility] [address] [place], [place], [address] Phone: [phone]   Fax:  [phone]  Name: Gary Ferguson MRN: [id number] Date of Birth: 04-Jul-1955

## 2018-04-13 ENCOUNTER — Other Ambulatory Visit: Payer: Self-pay

## 2018-04-13 ENCOUNTER — Ambulatory Visit (HOSPITAL_COMMUNITY): Payer: Medicare HMO

## 2018-04-13 ENCOUNTER — Encounter (HOSPITAL_COMMUNITY): Payer: Self-pay

## 2018-04-13 DIAGNOSIS — S91001A Unspecified open wound, right ankle, initial encounter: Secondary | ICD-10-CM | POA: Diagnosis not present

## 2018-04-13 DIAGNOSIS — L97319 Non-pressure chronic ulcer of right ankle with unspecified severity: Secondary | ICD-10-CM | POA: Diagnosis not present

## 2018-04-13 NOTE — Therapy (Signed)
[facility] [address] [place], [place], [address] Phone: [phone]   Fax:  [phone]  Wound Care Therapy  Patient Details  Name: Gary Ferguson MRN: [id number] Date of Birth: 06/10/1955 Referring Provider (PT): Avis EpleyJackson, Samantha J, [place]-C   Encounter Date: 04/13/2018  PT End of Session - 04/13/18 1258    Visit Number  2    Number of Visits  13    Date for PT Re-Evaluation  05/23/18    Authorization Type  [employer] Medicare HMO (no auth required, no visit limit, $35 copay)    Authorization Time Period  04/11/2018 - 05/26/2018    Authorization - Visit Number  2    Authorization - Number of Visits  10    PT Start Time  0945    PT Stop Time  1027    PT Time Calculation (min)  42 min    Activity Tolerance  Patient tolerated treatment well;Patient limited by pain    Behavior During Therapy  [facility] for tasks assessed/performed       Past Medical History:  Diagnosis Date  . Arthritis   . Chronic back pain   . Complication of anesthesia    Patient said that after surgery his throat closed up in 2011  . GERD (gastroesophageal reflux disease)   . History of DVT of lower extremity   . Hx of blood clots 2010  . Kidney stones   . Macular atrophy, retinal    bilateral  . Torn rotator cuff    right shoulder    Past Surgical History:  Procedure Laterality Date  . BACK SURGERY  1997  . BACK SURGERY  04/2009   3 separate surgies for infection   . COLONOSCOPY  07/21/2011   Procedure: COLONOSCOPY;  Surgeon: Corbin Adeobert M Rourk, MD;  [facility];  Service: Endoscopy;  Laterality: N/A;  12:45 PM  . TONSILLECTOMY    . TOTAL HIP ARTHROPLASTY Left 11/10/2015  . TOTAL HIP ARTHROPLASTY Left 11/10/2015   Procedure: TOTAL LEFT HIP ARTHROPLASTY ANTERIOR APPROACH;  Surgeon: Samson FredericBrian Swinteck, MD;  Location: [facility] OR;  Service: Orthopedics;  Laterality: Left;    There were no vitals filed for this visit.      Wound Therapy - 04/13/18 1248    Subjective  Patient arrives with dressing intact. He states his pain in the Rt ankle is about 1/10 currently. He was able to obtain a cast cover to use to shower.     Patient and Family Stated Goals  wound to heal and no longer to have pain    Date of Onset  11/05/17   approximate   Prior Treatments  self care    Pain Scale  0-10    Pain Score  1     Pain Type  Chronic pain    Pain Location  Ankle    Pain Orientation  Right    Pain Descriptors / Indicators  Sore    Pain Onset  With Activity    Patients Stated Pain Goal  0    Pain Intervention(s)  Distraction;Emotional support    Evaluation and Treatment Procedures Explained to Patient/Family  Yes    Evaluation and Treatment Procedures  agreed to    Wound Properties Date First Assessed: 04/11/18 Time First Assessed: 0951 Wound Type: Venous stasis ulcer;Other (Comment) , presents like venous statis ulcer  Location: Ankle Location Orientation: Right;Medial Wound Description (Comments): superior to Rt medial malleoli Present on Admission: Yes   Dressing  Type  Hydrocolloid;Pressure dressing   medihoney hydrocolloid, profore lite   Dressing Changed  Changed    Dressing Status  Old drainage    Dressing Change Frequency  PRN    Site / Wound Assessment  Yellow;Red;Pink;Dry    % Wound base Red or Granulating  40%    % Wound base Yellow/Fibrinous Exudate  25%    % Wound base Other/Granulation Tissue (Comment)  35%   pale smooth tissue   Peri-wound Assessment  Edema;Pink   red patchy irritationsuperior to wound   Wound Length (cm)  6 cm    Wound Width (cm)  1.9 cm    Wound Depth (cm)  --   0.3 in spot where slough is, true depth unknown   Wound Surface Area (cm^2)  11.4 cm^2    Margins  Attached edges (approximated)    Drainage Amount  Minimal    Drainage Description  Serosanguineous    Treatment  Cleansed;Hydrotherapy (Pulse lavage)    Pulsed lavage therapy - wound location  Rt medial ankle wound    Pulsed Lavage with Suction (psi)  8 psi     Pulsed Lavage with Suction - Normal Saline Used  1000 mL    Pulsed Lavage Tip  Tip with splash shield    Selective Debridement - Location  --    Selective Debridement - Tools Used  --    Selective Debridement - Tissue Removed  --    Wound Therapy - Clinical Statement  Patient's wound slightly macerated and increased in size from Tuesday. Wound continues to present as shallow and moist with minimal serosanguinous drainage and periwound has improved integrity and no erythema present. Sharps debridement attempted, however patient is intolerant due to pain/sensitivity, and pulsed lavage was performed to wound bed where slough was present. Continued with petroleum around periwound and medihoney hydrocolloid to wound bed followed by profore lite. Patient had a vasovagal episode during treatment following pulsed lavage and reported feeling overheated and nauseous. He had dry heaves and BP was found to be 110/75 (HR: 75bpm). After resting for 2 minutes patient reported it passed completely and he felt much better. BP was re-checked about 5 minutes later and found to be 121/81 (HR: 68). He reported these episodes happen occasionally but are not common for him. He will continue to benefit from skilled PT interventions to promote moist environment and reduce infection risk for wound healing.    Wound Therapy - Functional Problem List  difficulty walking    Factors Delaying/Impairing Wound Healing  Vascular compromise   history of DVT   Hydrotherapy Plan  Debridement;Dressing change;Patient/family education;Pulsatile lavage with suction    Wound Therapy - Frequency  2X / week    Wound Therapy - Current Recommendations  PT    Wound Plan  Continue cleansing/debridement. Look into topical analgesic for pain management. Pt may benefit from pulsed lavage as he is sensitive to selective debridement. Continue with compression wrapping.    Dressing   medihoney hydrocolloid, petrolleum applied ot periwound, profore lite          PT Short Term Goals - 04/11/18 1452      PT SHORT TERM GOAL #1   Title  Wound to be full of healthy red granulating/non-granulating tissue with no slough or eschar to move from inflammatory phase into proliferative phase of wound healing.     Time  3    Period  Weeks    Status  New    Target Date  05/02/18  PT Long Term Goals - 04/11/18 1453      PT LONG TERM GOAL #1   Title  Wound will reduce in L x W x D by 50% or greater to indicate wound is approximating and continuing to progress through proliferative phase.    Time  6    Period  Weeks    Status  New    Target Date  05/23/18      PT LONG TERM GOAL #2   Title  Patient will verbalize understanding of importance of life long use of compression stockings to prevent ulcer recurence and will demosntrate proper donning/doffing of garment independently.    Time  6    Period  Weeks    Status  New        Plan - 04/13/18 1258    Clinical Impression Statement  see above    Rehab Potential  Good    PT Frequency  2x / week    PT Duration  6 weeks    PT Treatment/Interventions  ADLs/Self Care Home Management;Patient/family education;Taping;Manual techniques;Other (comment)   skilled owund care inclduing selective debridement, pulsed lavage   PT Next Visit Plan  see above    Consulted and Agree with Plan of Care  Patient       Patient will benefit from skilled therapeutic intervention in order to improve the following deficits and impairments:  Decreased skin integrity, Pain, Difficulty walking  Visit Diagnosis: Open wound of right ankle, initial encounter  Ankle ulcer, right, with unspecified severity [facility])     Problem List Patient Active Problem List   Diagnosis Date Noted  . Primary osteoarthritis of left hip 11/10/2015    Gary Ferguson, PT, DPT Physical Therapist with [facility]  04/13/2018 12:59 PM    [facility] [address] [place], [place], [address] Phone: [phone]   Fax:  [phone]  Name: Gary Ferguson MRN: [id number] Date of Birth: May 29, 1955

## 2018-04-18 ENCOUNTER — Encounter (HOSPITAL_COMMUNITY): Payer: Self-pay

## 2018-04-18 ENCOUNTER — Ambulatory Visit (HOSPITAL_COMMUNITY): Payer: Medicare HMO

## 2018-04-18 ENCOUNTER — Other Ambulatory Visit: Payer: Self-pay

## 2018-04-18 DIAGNOSIS — L97319 Non-pressure chronic ulcer of right ankle with unspecified severity: Secondary | ICD-10-CM | POA: Diagnosis not present

## 2018-04-18 DIAGNOSIS — S91001A Unspecified open wound, right ankle, initial encounter: Secondary | ICD-10-CM | POA: Diagnosis not present

## 2018-04-18 NOTE — Therapy (Signed)
[facility] [address] [place], [place], [address] Phone: [phone]   Fax:  [phone]  Wound Care Therapy  Patient Details  Name: Gary Ferguson MRN: [id number] Date of Birth: 08-20-1955 Referring Provider (PT): Avis Epley, [place]   Encounter Date: 04/18/2018  PT End of Session - 04/18/18 1214    Visit Number  3    Number of Visits  13    Date for PT Re-Evaluation  05/23/18    Authorization Type  [employer] Medicare HMO (no auth required, no visit limit, $35 copay)    Authorization Time Period  04/11/2018 - 05/26/2018    Authorization - Visit Number  3    Authorization - Number of Visits  10    PT Start Time  [phone]    PT Stop Time  1020    PT Time Calculation (min)  28 min    Activity Tolerance  Patient tolerated treatment well;Patient limited by pain    Behavior During Therapy  [facility] for tasks assessed/performed       Past Medical History:  Diagnosis Date  . Arthritis   . Chronic back pain   . Complication of anesthesia    Patient said that after surgery his throat closed up in 2011  . GERD (gastroesophageal reflux disease)   . History of DVT of lower extremity   . Hx of blood clots 2010  . Kidney stones   . Macular atrophy, retinal    bilateral  . Torn rotator cuff    right shoulder    Past Surgical History:  Procedure Laterality Date  . BACK SURGERY  1997  . BACK SURGERY  04/2009   3 separate surgies for infection   . COLONOSCOPY  07/21/2011   Procedure: COLONOSCOPY;  Surgeon: [name], MD;  [facility];  Service: Endoscopy;  Laterality: N/A;  12:45 PM  . TONSILLECTOMY    . TOTAL HIP ARTHROPLASTY Left 11/10/2015  . TOTAL HIP ARTHROPLASTY Left 11/10/2015   Procedure: TOTAL LEFT HIP ARTHROPLASTY ANTERIOR APPROACH;  Surgeon: [name], MD;  [facility] OR;  Service: Orthopedics;  Laterality: Left;    There were no vitals filed for this visit.    Wound Therapy - 04/18/18 1115    Subjective   Patient reports his ankle is aching about 1/10 today again. He reports the dressing slide down his leg over the past 4 days and that he tried to pull it back up with limited success.    Patient and Family Stated Goals  wound to heal and no longer to have pain    Date of Onset  11/05/17   approximate   Prior Treatments  self care    Pain Scale  0-10    Pain Score  1     Pain Type  Chronic pain    Pain Location  Ankle    Pain Orientation  Right    Pain Descriptors / Indicators  Sore    Pain Onset  With Activity    Patients Stated Pain Goal  0    Pain Intervention(s)  Distraction;Emotional support    Evaluation and Treatment Procedures Explained to Patient/Family  Yes    Evaluation and Treatment Procedures  agreed to    Wound Properties Date First Assessed: 04/11/18 Time First Assessed: 0951 Wound Type: Venous stasis ulcer;Other (Comment) , presents like venous statis ulcer  Location: Ankle Location Orientation: Right;Medial Wound Description (Comments): superior to Rt medial malleoli Present on Admission:  Yes   Dressing Type  Pressure dressing;Alginate   alginate, profore lite   Dressing Changed  Changed    Dressing Status  Old drainage    Dressing Change Frequency  PRN    Site / Wound Assessment  Yellow;Red;Pink;Dry    % Wound base Red or Granulating  50%    % Wound base Yellow/Fibrinous Exudate  15%    % Wound base Other/Granulation Tissue (Comment)  35%   pale smooth tissue   Peri-wound Assessment  Edema;Pink   red patchy irritation superior to wound   Margins  Attached edges (approximated)    Drainage Amount  Minimal    Drainage Description  Serosanguineous    Treatment  Cleansed;Pressure applied    Pulsed lavage therapy - wound location  --    Pulsed Lavage with Suction (psi)  --    Pulsed Lavage with Suction - Normal Saline Used  --    Pulsed Lavage Tip  --    Wound Therapy - Clinical Statement  Patient's wound continues to present with healthy red/pink non-granulating  tissue. Wound bed and periwound with some maceration today and wound bed continues to have ~ 15% of slough that patient has not been able to tolerate debridement of. Transitioned to calcium alginate dressing to wound bed as drainage has increased some and to reduce maceration. Continued with profore lite as patient will not be able to tolerate large wrap due to restrictive size of work boot. Edema has reduced some and Rt ankle circumference is now 9.25 rather than 9.5 inches. Lt ankle circumference remains 8.5 inches. He will continue to benefit from skilled PT interventions to promote moist environment and reduce infection risk for wound healing.    Wound Therapy - Functional Problem List  difficulty walking    Factors Delaying/Impairing Wound Healing  Vascular compromise   history of DVT   Hydrotherapy Plan  Debridement;Dressing change;Patient/family education;Pulsatile lavage with suction    Wound Therapy - Frequency  2X / week    Wound Therapy - Current Recommendations  PT    Wound Plan  Continue cleansing/debridement. Look into topical analgesic for pain management. Pt may benefit from pulsed lavage as he is sensitive to selective debridement. Continue with compression wrapping.    Dressing   alginate, petrolleum applied to periwound, profore lite         PT Education - 04/18/18 1213    Education Details  Discussed transitioning to 1x/week as profore can be left on for full 7 days before requiring a change.     Person(s) Educated  Patient    Methods  Explanation    Comprehension  Verbalized understanding       PT Short Term Goals - 04/11/18 1452      PT SHORT TERM GOAL #1   Title  Wound to be full of healthy red granulating/non-granulating tissue with no slough or eschar to move from inflammatory phase into proliferative phase of wound healing.     Time  3    Period  Weeks    Status  New    Target Date  05/02/18        PT Long Term Goals - 04/11/18 1453      PT LONG TERM GOAL  #1   Title  Wound will reduce in L x W x D by 50% or greater to indicate wound is approximating and continuing to progress through proliferative phase.    Time  6    Period  Weeks  Status  New    Target Date  05/23/18      PT LONG TERM GOAL #2   Title  Patient will verbalize understanding of importance of life long use of compression stockings to prevent ulcer recurence and will demosntrate proper donning/doffing of garment independently.    Time  6    Period  Weeks    Status  New        Plan - 04/18/18 1214    Clinical Impression Statement  see above    Rehab Potential  Good    PT Frequency  2x / week    PT Duration  6 weeks    PT Treatment/Interventions  ADLs/Self Care Home Management;Patient/family education;Taping;Manual techniques;Other (comment)   skilled owund care inclduing selective debridement, pulsed lavage   PT Next Visit Plan  see above    Consulted and Agree with Plan of Care  Patient       Patient will benefit from skilled therapeutic intervention in order to improve the following deficits and impairments:  Decreased skin integrity, Pain, Difficulty walking  Visit Diagnosis: Open wound of right ankle, initial encounter  Ankle ulcer, right, with unspecified severity [facility])     Problem List Patient Active Problem List   Diagnosis Date Noted  . Primary osteoarthritis of left hip 11/10/2015    Valentino Saxon, PT, DPT, [facility] Physical Therapist with [facility]  04/18/2018 12:15 PM      [facility] [address] [place], [place], [address] Phone: [phone]   Fax:  [phone]  Name: Gary Ferguson MRN: [id number] Date of Birth: 1956/03/12

## 2018-04-20 ENCOUNTER — Telehealth (HOSPITAL_COMMUNITY): Payer: Self-pay | Admitting: Internal Medicine

## 2018-04-20 ENCOUNTER — Ambulatory Visit (HOSPITAL_COMMUNITY): Payer: Medicare HMO

## 2018-04-20 ENCOUNTER — Telehealth (HOSPITAL_COMMUNITY): Payer: Self-pay

## 2018-04-20 NOTE — Telephone Encounter (Signed)
04/20/18  I left patient a message because he called today to say that Fleet Contras told him to come once this week but then thru the duration of his schedule he is still 2 x week.  I just wanted to confirm if those appts needed to stay as scheduled or to be reduced down to 1x week .Marland KitchenMarland Kitchen

## 2018-04-20 NOTE — Telephone Encounter (Signed)
No show, called and left message concerning missed apt today.  Included next apt date and time with contact information given.    Airrion Otting, LPTA; CBIS [phone]  

## 2018-04-20 NOTE — Telephone Encounter (Signed)
04/20/18  Pt called to say that he got a call saying he missed his appt today.  He said that Fleet Contras backed him down to coming once a week and said that she would cancel today's appt but she didn't.  The patient is still down for 2x week and I will call him back to see which appts he wants to keep and the ones he wants to cancel.

## 2018-04-25 ENCOUNTER — Encounter (HOSPITAL_COMMUNITY): Payer: Self-pay

## 2018-04-25 ENCOUNTER — Ambulatory Visit (HOSPITAL_COMMUNITY): Payer: Medicare HMO

## 2018-04-25 ENCOUNTER — Other Ambulatory Visit: Payer: Self-pay

## 2018-04-25 DIAGNOSIS — L97319 Non-pressure chronic ulcer of right ankle with unspecified severity: Secondary | ICD-10-CM

## 2018-04-25 DIAGNOSIS — S91001A Unspecified open wound, right ankle, initial encounter: Secondary | ICD-10-CM | POA: Diagnosis not present

## 2018-04-25 NOTE — Therapy (Signed)
[facility] [address] [place], [place], [address] Phone: [phone]   Fax:  [phone]  Wound Care Therapy  Patient Details  Name: Gary Ferguson MRN: [id number] Date of Birth: Feb 14, 1956 Referring Provider (PT): Avis Epley, [place]   Encounter Date: 04/25/2018  PT End of Session - 04/25/18 1216    Visit Number  4    Number of Visits  13    Date for PT Re-Evaluation  05/23/18    Authorization Type  [employer] Medicare HMO (no auth required, no visit limit, $35 copay)    Authorization Time Period  04/11/2018 - 05/26/2018    Authorization - Visit Number  4    Authorization - Number of Visits  10    PT Start Time  0945    PT Stop Time  1028    PT Time Calculation (min)  43 min    Activity Tolerance  Patient tolerated treatment well;Patient limited by pain    Behavior During Therapy  [facility] for tasks assessed/performed       Past Medical History:  Diagnosis Date  . Arthritis   . Chronic back pain   . Complication of anesthesia    Patient said that after surgery his throat closed up in 2011  . GERD (gastroesophageal reflux disease)   . History of DVT of lower extremity   . Hx of blood clots 2010  . Kidney stones   . Macular atrophy, retinal    bilateral  . Torn rotator cuff    right shoulder    Past Surgical History:  Procedure Laterality Date  . BACK SURGERY  1997  . BACK SURGERY  04/2009   3 separate surgies for infection   . COLONOSCOPY  07/21/2011   Procedure: COLONOSCOPY;  Surgeon: [name], MD;  [facility];  Service: Endoscopy;  Laterality: N/A;  12:45 PM  . TONSILLECTOMY    . TOTAL HIP ARTHROPLASTY Left 11/10/2015  . TOTAL HIP ARTHROPLASTY Left 11/10/2015   Procedure: TOTAL LEFT HIP ARTHROPLASTY ANTERIOR APPROACH;  Surgeon: [name], MD;  [facility] OR;  Service: Orthopedics;  Laterality: Left;    There were no vitals filed for this visit.      Wound Therapy - 04/25/18 1210     Subjective  Patient arrives reporting 1/10 pain and states it hasn't seemed to be hurtin gas bad the last coupel days. He reports the dressing started sliding down his leg a couple days ago but stayed up for most of the week.     Patient and Family Stated Goals  wound to heal and no longer to have pain    Date of Onset  11/05/17   approximate   Prior Treatments  self care    Pain Scale  0-10    Pain Score  1     Pain Type  Chronic pain    Pain Location  Ankle    Pain Orientation  Right;Medial    Pain Descriptors / Indicators  Sore    Pain Onset  With Activity    Patients Stated Pain Goal  0    Pain Intervention(s)  Distraction;Emotional support    Evaluation and Treatment Procedures Explained to Patient/Family  Yes    Evaluation and Treatment Procedures  agreed to    Wound Properties Date First Assessed: 04/11/18 Time First Assessed: 0951 Wound Type: Venous stasis ulcer;Other (Comment) , presents like venous statis ulcer  Location: Ankle Location Orientation: Right;Medial Wound Description (Comments):  superior to Rt medial malleoli Present on Admission: Yes   Dressing Type  Pressure dressing;Alginate   alginate, profore lite   Dressing Changed  Changed    Dressing Status  Old drainage    Dressing Change Frequency  PRN    Site / Wound Assessment  Yellow;Red;Pink;Dry    % Wound base Red or Granulating  50%    % Wound base Yellow/Fibrinous Exudate  15%    % Wound base Other/Granulation Tissue (Comment)  35%   pale smooth tissue   Peri-wound Assessment  Edema;Pink   red patchy irritation superior to wound   Wound Length (cm)  3.1 cm   6   Wound Width (cm)  1.5 cm   1.9   Wound Depth (cm)  --   0.2 in small spot with slough   Wound Surface Area (cm^2)  4.65 cm^2    Margins  Attached edges (approximated)    Drainage Amount  Minimal    Drainage Description  Serosanguineous    Treatment  Cleansed;Debridement (Selective);Pressure applied    Selective Debridement - Location  Rt medial  ankle wound    Selective Debridement - Tools Used  Forceps    Selective Debridement - Tissue Removed  dried non-adhearnt slough and moist slough    Wound Therapy - Clinical Statement  Patient presents with no maceration to periwound today and reduction in size of wound bed. Patient was able to tolerate debridement of dried slough and some debridement of moist slough in small spot in wound bed. Wound bed remains tender. Xeroform applied to red spots in periwound to maintain moist environment and continued with alginate at wound bed and profore lite wrapping. Patient will reduce to 1x/week for dressing change. He will continue to benefit from skilled PT interventions to promote moist environment and reduce infection risk for wound healing.    Wound Therapy - Functional Problem List  difficulty walking    Factors Delaying/Impairing Wound Healing  Vascular compromise   history of DVT   Hydrotherapy Plan  Debridement;Dressing change;Patient/family education;Pulsatile lavage with suction    Wound Therapy - Frequency  2X / week    Wound Therapy - Current Recommendations  PT    Wound Plan  Continue cleansing/debridement. Look into topical analgesic for pain management. Pt may benefit from pulsed lavage as he is sensitive to selective debridement. Continue with compression wrapping.    Dressing   xeroform on red spots, alginate, petrolleum applied to periwound, profore lite         PT Education - 04/25/18 1216    Education Details  Educated on reducing to 1x/week for therapy.     Person(s) Educated  Patient    Methods  Explanation    Comprehension  Verbalized understanding       PT Short Term Goals - 04/11/18 1452      PT SHORT TERM GOAL #1   Title  Wound to be full of healthy red granulating/non-granulating tissue with no slough or eschar to move from inflammatory phase into proliferative phase of wound healing.     Time  3    Period  Weeks    Status  New    Target Date  05/02/18         PT Long Term Goals - 04/11/18 1453      PT LONG TERM GOAL #1   Title  Wound will reduce in L x W x D by 50% or greater to indicate wound is approximating and continuing to progress  through proliferative phase.    Time  6    Period  Weeks    Status  New    Target Date  05/23/18      PT LONG TERM GOAL #2   Title  Patient will verbalize understanding of importance of life long use of compression stockings to prevent ulcer recurence and will demosntrate proper donning/doffing of garment independently.    Time  6    Period  Weeks    Status  New         Plan - 04/25/18 1217    Clinical Impression Statement  see above    Rehab Potential  Good    PT Frequency  2x / week    PT Duration  6 weeks    PT Treatment/Interventions  ADLs/Self Care Home Management;Patient/family education;Taping;Manual techniques;Other (comment)   skilled owund care inclduing selective debridement, pulsed lavage   PT Next Visit Plan  see above    Consulted and Agree with Plan of Care  Patient       Patient will benefit from skilled therapeutic intervention in order to improve the following deficits and impairments:  Decreased skin integrity, Pain, Difficulty walking  Visit Diagnosis: Open wound of right ankle, initial encounter  Ankle ulcer, right, with unspecified severity [facility])     Problem List Patient Active Problem List   Diagnosis Date Noted  . Primary osteoarthritis of left hip 11/10/2015    Valentino Saxon, PT, DPT, [facility] Physical Therapist with [facility]  04/25/2018 12:17 PM    [place] [facility] [address] [place], [place], [address] Phone: [phone]   Fax:  [phone]  Name: ZACHARAY DRAGONE MRN: [id number] Date of Birth: 12/13/1955

## 2018-04-28 ENCOUNTER — Ambulatory Visit (HOSPITAL_COMMUNITY): Payer: Medicare PPO

## 2018-05-02 ENCOUNTER — Ambulatory Visit (HOSPITAL_COMMUNITY): Payer: Medicare HMO

## 2018-05-02 ENCOUNTER — Encounter (HOSPITAL_COMMUNITY): Payer: Self-pay

## 2018-05-02 DIAGNOSIS — S91001A Unspecified open wound, right ankle, initial encounter: Secondary | ICD-10-CM

## 2018-05-02 DIAGNOSIS — L97319 Non-pressure chronic ulcer of right ankle with unspecified severity: Secondary | ICD-10-CM

## 2018-05-02 NOTE — Therapy (Signed)
[facility] [address] [place], [place], [address] Phone: [phone]   Fax:  [phone]  Wound Care Therapy  Patient Details  Name: Gary Ferguson MRN: [id number] Date of Birth: 1955-11-08 Referring Provider (PT): Avis Epley, [place]   Encounter Date: 05/02/2018  PT End of Session - 05/02/18 1820    Visit Number  5    Number of Visits  13    Date for PT Re-Evaluation  05/23/18    Authorization Type  [employer] Medicare HMO (no auth required, no visit limit, $35 copay)    Authorization Time Period  04/11/2018 - 05/26/2018    Authorization - Visit Number  5    Authorization - Number of Visits  10    PT Start Time  1735    PT Stop Time  1813    PT Time Calculation (min)  38 min    Activity Tolerance  Patient tolerated treatment well;No increased pain    Behavior During Therapy  [facility] for tasks assessed/performed       Past Medical History:  Diagnosis Date  . Arthritis   . Chronic back pain   . Complication of anesthesia    Patient said that after surgery his throat closed up in 2011  . GERD (gastroesophageal reflux disease)   . History of DVT of lower extremity   . Hx of blood clots 2010  . Kidney stones   . Macular atrophy, retinal    bilateral  . Torn rotator cuff    right shoulder    Past Surgical History:  Procedure Laterality Date  . BACK SURGERY  1997  . BACK SURGERY  04/2009   3 separate surgies for infection   . COLONOSCOPY  07/21/2011   Procedure: COLONOSCOPY;  Surgeon: [name], MD;  [facility];  Service: Endoscopy;  Laterality: N/A;  12:45 PM  . TONSILLECTOMY    . TOTAL HIP ARTHROPLASTY Left 11/10/2015  . TOTAL HIP ARTHROPLASTY Left 11/10/2015   Procedure: TOTAL LEFT HIP ARTHROPLASTY ANTERIOR APPROACH;  Surgeon: [name], MD;  [facility];  Service: Orthopedics;  Laterality: Left;    There were no vitals filed for this visit.   Subjective Assessment - 05/02/18 1814    Subjective   Pt stated he taped the top of dressings to reduce sliding down and reports vast improvements with comfort today    Currently in Pain?  No/denies                Wound Therapy - 05/02/18 1815    Subjective  Pt stated he taped the top of dressings to reduce sliding down and reports vast improvements with comfort today    Patient and Family Stated Goals  wound to heal and no longer to have pain    Date of Onset  11/05/17   approximate   Prior Treatments  self care    Pain Scale  0-10    Pain Score  0-No pain    Evaluation and Treatment Procedures Explained to Patient/Family  Yes    Evaluation and Treatment Procedures  agreed to    Wound Properties Date First Assessed: 04/11/18 Time First Assessed: 0951 Wound Type: Venous stasis ulcer;Other (Comment) , presents like venous statis ulcer  Location: Ankle Location Orientation: Right;Medial Wound Description (Comments): superior to Rt medial malleoli Present on Admission: Yes   Dressing Type  Impregnated gauze (bismuth);Alginate;Compression wrap   xeroform wiht alginate and profore lite with vaseline  Dressing Changed  Changed    Dressing Status  Old drainage    Dressing Change Frequency  PRN    Site / Wound Assessment  Yellow;Red;Pink;Dry    % Wound base Red or Granulating  60%    % Wound base Yellow/Fibrinous Exudate  5%    % Wound base Other/Granulation Tissue (Comment)  35%   pale smooth tissue   Peri-wound Assessment  Edema;Pink   Red patchy irritation superior to wound   Margins  Attached edges (approximated)    Drainage Amount  Minimal    Drainage Description  Serosanguineous    Treatment  Cleansed;Debridement (Selective)    Selective Debridement - Location  Rt medial ankle wound    Selective Debridement - Tools Used  Forceps    Selective Debridement - Tissue Removed  dried non-adhearnt slough and moist slough    Wound Therapy - Clinical Statement  Pt presents with good tolerance with gentle debridment today, able to reduce  slough with debridment.  Good skin integrity noted perimeter of wound.  Continued wiht xeroform and alginate for healling and drainage control, profore lite for edema control.  No reports of pain through session.      Wound Therapy - Functional Problem List  difficulty walking    Factors Delaying/Impairing Wound Healing  Vascular compromise   History of DVT   Hydrotherapy Plan  Debridement;Dressing change;Patient/family education;Pulsatile lavage with suction    Wound Therapy - Frequency  2X / week   reduced to 1x/week for PT   Wound Therapy - Current Recommendations  PT    Wound Plan  Continue cleansing/debridement. Look into topical analgesic for pain management. Pt may benefit from pulsed lavage as he is sensitive to selective debridement. Continue with compression wrapping.    Dressing   xeroform on red spots, alginate, petrolleum applied to periwound, profore lite                PT Short Term Goals - 04/11/18 1452      PT SHORT TERM GOAL #1   Title  Wound to be full of healthy red granulating/non-granulating tissue with no slough or eschar to move from inflammatory phase into proliferative phase of wound healing.     Time  3    Period  Weeks    Status  New    Target Date  05/02/18        PT Long Term Goals - 04/11/18 1453      PT LONG TERM GOAL #1   Title  Wound will reduce in L x W x D by 50% or greater to indicate wound is approximating and continuing to progress through proliferative phase.    Time  6    Period  Weeks    Status  New    Target Date  05/23/18      PT LONG TERM GOAL #2   Title  Patient will verbalize understanding of importance of life long use of compression stockings to prevent ulcer recurence and will demosntrate proper donning/doffing of garment independently.    Time  6    Period  Weeks    Status  New              Patient will benefit from skilled therapeutic intervention in order to improve the following deficits and impairments:      Visit Diagnosis: Open wound of right ankle, initial encounter  Ankle ulcer, right, with unspecified severity [facility])     Problem List Patient Active Problem List  Diagnosis Date Noted  . Primary osteoarthritis of left hip 11/10/2015   Becky Sax, LPTA; CBIS [phone]  Juel Burrow 05/02/2018, 6:21 PM  [place] [facility] [address] [place], [place], [address] Phone: [phone]   Fax:  [phone]  Name: Gary Ferguson MRN: [id number] Date of Birth: [birth year]

## 2018-05-05 ENCOUNTER — Ambulatory Visit (HOSPITAL_COMMUNITY): Payer: Medicare PPO

## 2018-05-09 ENCOUNTER — Other Ambulatory Visit: Payer: Self-pay

## 2018-05-09 ENCOUNTER — Encounter (HOSPITAL_COMMUNITY): Payer: Self-pay

## 2018-05-09 ENCOUNTER — Ambulatory Visit (HOSPITAL_COMMUNITY): Payer: Medicare HMO | Attending: Family Medicine

## 2018-05-09 DIAGNOSIS — S91001A Unspecified open wound, right ankle, initial encounter: Secondary | ICD-10-CM | POA: Insufficient documentation

## 2018-05-09 DIAGNOSIS — L97319 Non-pressure chronic ulcer of right ankle with unspecified severity: Secondary | ICD-10-CM | POA: Diagnosis present

## 2018-05-09 NOTE — Therapy (Signed)
[place] [facility] [address] [place], [place], [address] Phone: [phone]   Fax:  [phone]  Wound Care Therapy  Patient Details  Name: Gary Ferguson MRN: [id number] Date of Birth: 03/09/1956 Referring Provider (PT): Avis EpleyJackson, Samantha J, [place]   Encounter Date: 05/09/2018  PT End of Session - 05/09/18 1044    Visit Number  6    Number of Visits  13    Date for PT Re-Evaluation  05/23/18    Authorization Type  [employer] Medicare HMO (no auth required, no visit limit, $35 copay)    Authorization Time Period  04/11/2018 - 05/26/2018    Authorization - Visit Number  6    Authorization - Number of Visits  10    PT Start Time  0944    PT Stop Time  1019    PT Time Calculation (min)  35 min    Activity Tolerance  Patient tolerated treatment well;No increased pain    Behavior During Therapy  WFL for tasks assessed/performed       Past Medical History:  Diagnosis Date  . Arthritis   . Chronic back pain   . Complication of anesthesia    Patient said that after surgery his throat closed up in 2011  . GERD (gastroesophageal reflux disease)   . History of DVT of lower extremity   . Hx of blood clots 2010  . Kidney stones   . Macular atrophy, retinal    bilateral  . Torn rotator cuff    right shoulder    Past Surgical History:  Procedure Laterality Date  . BACK SURGERY  1997  . BACK SURGERY  04/2009   3 separate surgies for infection   . COLONOSCOPY  07/21/2011   Procedure: COLONOSCOPY;  Surgeon: Corbin Adeobert M Rourk, MD;  [facility];  Service: Endoscopy;  Laterality: N/A;  12:45 PM  . TONSILLECTOMY    . TOTAL HIP ARTHROPLASTY Left 11/10/2015  . TOTAL HIP ARTHROPLASTY Left 11/10/2015   Procedure: TOTAL LEFT HIP ARTHROPLASTY ANTERIOR APPROACH;  Surgeon: Samson FredericBrian Swinteck, MD;  Location: [facility] OR;  Service: Orthopedics;  Laterality: Left;    There were no vitals filed for this visit.    Wound Therapy - 05/09/18 1032    Subjective  Patient  arrives with no pain today. He states his dressing did fall down a little bit. He states he used to have compression stockings and is familiar with how to care for them.    Patient and Family Stated Goals  wound to heal and no longer to have pain    Date of Onset  11/05/17   approximate   Prior Treatments  self care    Pain Scale  0-10    Pain Score  0-No pain    Evaluation and Treatment Procedures Explained to Patient/Family  Yes    Evaluation and Treatment Procedures  agreed to    Wound Properties Date First Assessed: 04/11/18 Time First Assessed: 0951 Wound Type: Venous stasis ulcer;Other (Comment) , presents like venous statis ulcer  Location: Ankle Location Orientation: Right;Medial Wound Description (Comments): superior to Rt medial malleoli Present on Admission: Yes   Dressing Type  Impregnated gauze (bismuth);Compression wrap   xeroform and profore lite   Dressing Changed  Changed    Dressing Status  Old drainage    Dressing Change Frequency  PRN    Site / Wound Assessment  Yellow;Red;Pink;Dry    % Wound base Red or Granulating  5%  small spot of open wound; pink non-granulating   % Wound base Yellow/Fibrinous Exudate  0%    % Wound base Other/Granulation Tissue (Comment)  95%   approximated skin, purple/pale coloring   Peri-wound Assessment  Edema;Pink   Red patchy irritation superior to wound   Wound Length (cm)  0.1 cm    Wound Width (cm)  0.1 cm    Wound Depth (cm)  0.1 cm    Wound Volume (cm^3)  0 cm^3    Wound Surface Area (cm^2)  0.01 cm^2    Margins  Attached edges (approximated)    Drainage Amount  Minimal    Drainage Description  Serous    Treatment  Cleansed;Debridement (Selective)    Selective Debridement - Location  Rt medial ankle wound    Selective Debridement - Tools Used  Forceps    Selective Debridement - Tissue Removed  dried non-adhearnt slough    Wound Therapy - Clinical Statement  Patient with no pain today and no pain with debridement of dried  exudate. Skin integrity in periwound is improved but remains slightly darker pigmentation of brown/purple color compared to skin along lateral leg. Small opening remains from original wound and xeroform applied to wound and periwound to maintain moist healthy skin environment. Patient was educated on how to obtain compression stockings and proper care for them, measurements taken and provided to patient this date. Continued with profore lite today to control edema. Anticipate wound will likely be healed next session and patient will be ready for discharge with compression garments to manage LE edema.    Wound Therapy - Functional Problem List  difficulty walking    Factors Delaying/Impairing Wound Healing  Vascular compromise   History of DVT   Hydrotherapy Plan  Debridement;Dressing change;Patient/family education;Pulsatile lavage with suction    Wound Therapy - Frequency  2X / week   reduced to 1x/week for PT   Wound Therapy - Current Recommendations  PT    Wound Plan  Continue cleansing/debridement. If patient's wound is healed discharge with compression garment to manage edema and prevent further wounds.     Dressing   xeroform on red spots, alginate, petrolleum applied to periwound, profore lite        PT Education - 05/09/18 1045    Education Details  Educatd on proper washing/drying for compression garments. Instructed patient to obtain at least 1 pair to alternate which sock he wears daily and to wash with gentle detergent and hang dry.  Provided handout for obtaining compression stockings.    patient's measures: 10.5 shoe, 9" ankle, 16.25" calf   Person(s) Educated  Patient    Methods  Explanation    Comprehension  Verbalized understanding       PT Short Term Goals - 05/09/18 1044      PT SHORT TERM GOAL #1   Title  Wound to be full of healthy red granulating/non-granulating tissue with no slough or eschar to move from inflammatory phase into proliferative phase of wound healing.      Time  3    Period  Weeks    Status  Achieved        PT Long Term Goals - 05/09/18 1044      PT LONG TERM GOAL #1   Title  Wound will reduce in L x W x D by 50% or greater to indicate wound is approximating and continuing to progress through proliferative phase.    Time  6    Period  Weeks  Status  Achieved      PT LONG TERM GOAL #2   Title  Patient will verbalize understanding of importance of life long use of compression stockings to prevent ulcer recurence and will demosntrate proper donning/doffing of garment independently.    Time  6    Period  Weeks    Status  On-going        Plan - 05/09/18 1044    Clinical Impression Statement  see above    Rehab Potential  Good    PT Frequency  2x / week    PT Duration  6 weeks    PT Treatment/Interventions  ADLs/Self Care Home Management;Patient/family education;Taping;Manual techniques;Other (comment)   skilled owund care inclduing selective debridement, pulsed lavage   PT Next Visit Plan  see above    Consulted and Agree with Plan of Care  Patient       Patient will benefit from skilled therapeutic intervention in order to improve the following deficits and impairments:  Decreased skin integrity, Pain, Difficulty walking  Visit Diagnosis: Open wound of right ankle, initial encounter  Ankle ulcer, right, with unspecified severity [facility]     Problem List Patient Active Problem List   Diagnosis Date Noted  . Primary osteoarthritis of left hip 11/10/2015    Gary Ferguson, PT, DPT, [facility] Physical Therapist with [facility]  05/09/2018 10:46 AM    [place] [facility] [address] [place], [place], [address] Phone: [phone]   Fax:  [phone]  Name: Gary Ferguson MRN: [id number] Date of Birth: 04/12/1955

## 2018-05-11 ENCOUNTER — Ambulatory Visit (HOSPITAL_COMMUNITY): Payer: Medicare HMO

## 2018-05-16 ENCOUNTER — Ambulatory Visit (HOSPITAL_COMMUNITY): Payer: Medicare HMO | Admitting: Physical Therapy

## 2018-05-16 DIAGNOSIS — S91001A Unspecified open wound, right ankle, initial encounter: Secondary | ICD-10-CM | POA: Diagnosis not present

## 2018-05-16 DIAGNOSIS — L97319 Non-pressure chronic ulcer of right ankle with unspecified severity: Secondary | ICD-10-CM | POA: Diagnosis not present

## 2018-05-16 NOTE — Therapy (Addendum)
[place], [place], [address] Phone: [phone]   Fax:  [phone]  Wound Care Therapy/Discharge Summary  Patient Details  Name: Gary Ferguson MRN: [id number] Date of Birth: 06/22/1955 Referring Provider (PT): Jake Samples, [place]   Encounter Date: 05/16/2018   PHYSICAL THERAPY DISCHARGE SUMMARY  Visits from Start of Care: 7  Current functional level related to goals / functional outcomes: Patient presents today with well healing wound and one small area of ras skin due to dressing sliding down on leg. No other wounds or limited skin integrity noted. Patient has been educated on proper self care with daily cleansing and moisturizing of skin and how to don/doff compression stockings daily. He demonstrated proper technique and verbalized his understanding of daily wear and proper care for garments. He will be discharged from this episode of care.  Remaining deficits: See below details.   Education / Equipment: Patient educated on garment care, how and when to don/doff.  Importance of wearing daily as well as moisturizing.   Plan: Patient agrees to discharge.  Patient goals were met. Patient is being discharged due to meeting the stated rehab goals.  ?????    Kipp Brood, PT, DPT, [facility] Physical Therapist with [facility]  05/18/2018 8:52 AM      PT End of Session - 05/16/18 1606    Visit Number  7    Number of Visits  13    Date for PT Re-Evaluation  05/23/18    Authorization Type  [employer] Medicare HMO (no auth required, no visit limit, $35 copay)    Authorization Time Period  04/11/2018 - 05/26/2018    Authorization - Visit Number  7    Authorization - Number of Visits  10    PT Start Time  0957    PT Stop Time  1020    PT Time Calculation (min)  23 min    Activity Tolerance  Patient tolerated treatment well;No increased pain    Behavior During Therapy  WFL for tasks  assessed/performed       Past Medical History:  Diagnosis Date  . Arthritis   . Chronic back pain   . Complication of anesthesia    Patient said that after surgery his throat closed up in 2011  . GERD (gastroesophageal reflux disease)   . History of DVT of lower extremity   . Hx of blood clots 2010  . Kidney stones   . Macular atrophy, retinal    bilateral  . Torn rotator cuff    right shoulder    Past Surgical History:  Procedure Laterality Date  . BACK SURGERY  1997  . BACK SURGERY  04/2009   3 separate surgies for infection   . COLONOSCOPY  07/21/2011   Procedure: COLONOSCOPY;  Surgeon: Daneil Dolin, MD;  [facility];  Service: Endoscopy;  Laterality: N/A;  12:45 PM  . TONSILLECTOMY    . TOTAL HIP ARTHROPLASTY Left 11/10/2015  . TOTAL HIP ARTHROPLASTY Left 11/10/2015   Procedure: TOTAL LEFT HIP ARTHROPLASTY ANTERIOR APPROACH;  Surgeon: Rod Can, MD;  Location: [place];  Service: Orthopedics;  Laterality: Left;    There were no vitals filed for this visit.   Subjective Assessment - 05/16/18 1559    Subjective  pt states he received his compression socks and feels he is ready to be discharged.     Currently in Pain?  No/denies  Wound Therapy - 05/16/18 1600    Subjective  Patient arrives with no pain today. He states his dressing did fall down a little bit. He states he used to have compression stockings and is familiar with how to care for them.    Patient and Family Stated Goals  wound to heal and no longer to have pain    Date of Onset  11/05/17   approximate   Prior Treatments  self care    Pain Scale  0-10    Pain Score  0-No pain    Evaluation and Treatment Procedures Explained to Patient/Family  Yes    Evaluation and Treatment Procedures  agreed to    Wound Properties Date First Assessed: 04/11/18 Time First Assessed: 0951 Wound Type: Venous stasis ulcer;Other (Comment) , presents like venous statis ulcer  Location:  Ankle Location Orientation: Right;Medial Wound Description (Comments): superior to Rt medial malleoli Present on Admission: Yes   Dressing Type  Impregnated gauze (bismuth);Compression wrap   xeroform and profore lite   Dressing Changed  Changed    Dressing Status  Old drainage    Dressing Change Frequency  PRN    Site / Wound Assessment  Yellow;Red;Pink;Dry    % Wound base Red or Granulating  100%   small spot of open wound; pink non-granulating   % Wound base Yellow/Fibrinous Exudate  0%    % Wound base Other/Granulation Tissue (Comment)  0%   approximated skin, purple/pale coloring   Peri-wound Assessment  Edema;Pink   Red patchy irritation superior to wound   Margins  Attached edges (approximated)    Drainage Amount  None    Drainage Description  --    Treatment  Cleansed    Selective Debridement - Location  --    Selective Debridement - Tools Used  --    Selective Debridement - Tissue Removed  --    Wound Therapy - Clinical Statement  Pt overall healed at this point.  One small patch above original area that appears raw from dressing sliding down, otherwise no longer requires skilled care.  Instructed to moisturize each evening after removing garment and to don garment each morning.  Pt able to verbalize and demonstrate understanding.      Wound Therapy - Functional Problem List  difficulty walking    Factors Delaying/Impairing Wound Healing  Vascular compromise   History of DVT   Hydrotherapy Plan  Debridement;Dressing change;Patient/family education;Pulsatile lavage with suction    Wound Therapy - Frequency  2X / week   reduced to 1x/week for PT   Wound Therapy - Current Recommendations  PT    Wound Plan  discharge to self care.    Dressing   --              PT Education - 05/16/18 1605    Education Details  educated on garment care, how and when to don/doff.  Importance of wearing daily as well as moisturizing.      Person(s) Educated  Patient    Methods   Explanation;Demonstration;Tactile cues;Verbal cues    Comprehension  Verbalized understanding;Returned demonstration       PT Short Term Goals - 05/09/18 1044      PT SHORT TERM GOAL #1   Title  Wound to be full of healthy red granulating/non-granulating tissue with no slough or eschar to move from inflammatory phase into proliferative phase of wound healing.     Time  3    Period  Weeks  Status  Achieved        PT Long Term Goals - 05/16/18 1607      PT LONG TERM GOAL #1   Title  Wound will reduce in L x W x D by 50% or greater to indicate wound is approximating and continuing to progress through proliferative phase.    Time  6    Period  Weeks    Status  Achieved      PT LONG TERM GOAL #2   Title  Patient will verbalize understanding of importance of life long use of compression stockings to prevent ulcer recurence and will demosntrate proper donning/doffing of garment independently.    Time  6    Period  Weeks    Status  Achieved           05/16/18 1700  Plan  Clinical Impression Statement see above  Pt will benefit from skilled therapeutic intervention in order to improve on the following deficits Decreased skin integrity;Pain;Difficulty walking  Rehab Potential Good  PT Frequency 2x / week  PT Duration 6 weeks  PT Treatment/Interventions ADLs/Self Care Home Management;Patient/family education;Taping;Manual techniques;Other (comment) (skilled owund care inclduing selective debridement, pulsed lavage)  PT Next Visit Plan Discharge  Consulted and Agree with Plan of Care Patient      Patient will benefit from skilled therapeutic intervention in order to improve the following deficits and impairments:  Decreased skin integrity, Pain, Difficulty walking  Visit Diagnosis: Open wound of right ankle, initial encounter  Ankle ulcer, right, with unspecified severity [facility])     Problem List Patient Active Problem List   Diagnosis Date Noted  . Primary  osteoarthritis of left hip 11/10/2015   Teena Irani, PTA/CLT [id number]  Jacques Navy 05/16/2018, 4:08 PM  [place], [place], [address] Phone: [phone]   Fax:  [phone]  Name: Gary Ferguson MRN: [id number] Date of Birth: 1955/09/18

## 2018-05-18 ENCOUNTER — Ambulatory Visit (HOSPITAL_COMMUNITY): Payer: Medicare HMO | Admitting: Physical Therapy

## 2018-05-23 ENCOUNTER — Ambulatory Visit (HOSPITAL_COMMUNITY): Payer: Medicare HMO

## 2018-05-25 ENCOUNTER — Ambulatory Visit (HOSPITAL_COMMUNITY): Payer: Medicare HMO

## 2018-07-04 DIAGNOSIS — Z125 Encounter for screening for malignant neoplasm of prostate: Secondary | ICD-10-CM | POA: Diagnosis not present

## 2018-07-04 DIAGNOSIS — E782 Mixed hyperlipidemia: Secondary | ICD-10-CM | POA: Diagnosis not present

## 2018-07-04 DIAGNOSIS — E7849 Other hyperlipidemia: Secondary | ICD-10-CM | POA: Diagnosis not present

## 2018-07-04 DIAGNOSIS — Z0001 Encounter for general adult medical examination with abnormal findings: Secondary | ICD-10-CM | POA: Diagnosis not present

## 2018-07-04 DIAGNOSIS — Z79899 Other long term (current) drug therapy: Secondary | ICD-10-CM | POA: Diagnosis not present

## 2018-07-04 DIAGNOSIS — Z1389 Encounter for screening for other disorder: Secondary | ICD-10-CM | POA: Diagnosis not present

## 2018-07-04 DIAGNOSIS — G894 Chronic pain syndrome: Secondary | ICD-10-CM | POA: Diagnosis not present

## 2018-07-04 DIAGNOSIS — E663 Overweight: Secondary | ICD-10-CM | POA: Diagnosis not present

## 2018-07-04 DIAGNOSIS — Z6829 Body mass index (BMI) 29.0-29.9, adult: Secondary | ICD-10-CM | POA: Diagnosis not present

## 2018-10-19 DIAGNOSIS — Z1389 Encounter for screening for other disorder: Secondary | ICD-10-CM | POA: Diagnosis not present

## 2018-10-19 DIAGNOSIS — E663 Overweight: Secondary | ICD-10-CM | POA: Diagnosis not present

## 2018-10-19 DIAGNOSIS — L03115 Cellulitis of right lower limb: Secondary | ICD-10-CM | POA: Diagnosis not present

## 2018-10-19 DIAGNOSIS — Z6827 Body mass index (BMI) 27.0-27.9, adult: Secondary | ICD-10-CM | POA: Diagnosis not present

## 2018-10-19 DIAGNOSIS — R600 Localized edema: Secondary | ICD-10-CM | POA: Diagnosis not present

## 2018-10-19 DIAGNOSIS — M8668 Other chronic osteomyelitis, other site: Secondary | ICD-10-CM | POA: Diagnosis not present

## 2018-10-19 DIAGNOSIS — I872 Venous insufficiency (chronic) (peripheral): Secondary | ICD-10-CM | POA: Diagnosis not present

## 2018-10-26 ENCOUNTER — Ambulatory Visit (HOSPITAL_COMMUNITY): Payer: Medicare HMO | Attending: Internal Medicine

## 2018-10-26 ENCOUNTER — Other Ambulatory Visit: Payer: Self-pay

## 2018-10-26 ENCOUNTER — Encounter (HOSPITAL_COMMUNITY): Payer: Self-pay

## 2018-10-26 DIAGNOSIS — S91001A Unspecified open wound, right ankle, initial encounter: Secondary | ICD-10-CM | POA: Diagnosis present

## 2018-10-26 DIAGNOSIS — L97319 Non-pressure chronic ulcer of right ankle with unspecified severity: Secondary | ICD-10-CM | POA: Insufficient documentation

## 2018-10-26 DIAGNOSIS — R600 Localized edema: Secondary | ICD-10-CM | POA: Insufficient documentation

## 2018-10-26 DIAGNOSIS — L03115 Cellulitis of right lower limb: Secondary | ICD-10-CM | POA: Diagnosis present

## 2018-10-26 NOTE — Therapy (Signed)
[facility] [address] [place], [place], [address] Phone: [phone]   Fax:  [phone]  Wound Care Evaluation  Patient Details  Name: Gary Ferguson MRN: [id number] Date of Birth: [birth year] Referring Provider (PT): Avis Epley, [place]   Encounter Date: 10/26/2018     10/26/18 1600  PT Visits / Re-Eval  Visit Number 1  Number of Visits 12  Date for PT Re-Evaluation 12/07/18  Authorization  Authorization Type [employer] Medicare HMO ($35 copay, no visit limit)  Authorization Time Period 10/26/18-12/07/18  Authorization - Visit Number 1  Authorization - Number of Visits 10  PT Time Calculation  PT Start Time 1510  PT Stop Time 1600  PT Time Calculation (min) 50 min  PT - End of Session  Activity Tolerance Patient tolerated treatment well  Behavior During Therapy [facility] for tasks assessed/performed    Past Medical History:  Diagnosis Date  . Arthritis   . Chronic back pain   . Complication of anesthesia    Patient said that after surgery his throat closed up in 2011  . GERD (gastroesophageal reflux disease)   . History of DVT of lower extremity   . Hx of blood clots 2010  . Kidney stones   . Macular atrophy, retinal    bilateral  . Torn rotator cuff    right shoulder    Past Surgical History:  Procedure Laterality Date  . BACK SURGERY  1997  . BACK SURGERY  04/2009   3 separate surgies for infection   . COLONOSCOPY  07/21/2011   Procedure: COLONOSCOPY;  Surgeon: [name], MD;  [facility];  Service: Endoscopy;  Laterality: N/A;  12:45 PM  . TONSILLECTOMY    . TOTAL HIP ARTHROPLASTY Left 11/10/2015  . TOTAL HIP ARTHROPLASTY Left 11/10/2015   Procedure: TOTAL LEFT HIP ARTHROPLASTY ANTERIOR APPROACH;  Surgeon: [name], MD;  [facility];  Service: Orthopedics;  Laterality: Left;    There were no vitals filed for this visit.      10/26/18 0001  Assessment  Medical Diagnosis Cellulitis of Rt  Lower Leg  Referring Provider (PT) Marlowe Kays, MD  Onset Date/Surgical Date 06/27/18 (approx)  Prior Therapy yes pt treated for wound care inour office   Precautions  Precaution Comments pt is legally blind  Restrictions  Weight Bearing Restrictions No  Other Position/Activity Restrictions Patient is not able to drive due to vision impairments, and requires magnifying glass to read.   Balance Screen  Has the patient fallen in the past 6 months No  Has the patient had a decrease in activity level because of a fear of falling?  No  Is the patient reluctant to leave their home because of a fear of falling?  No  Home Environment  Living Environment Private residence  Available Help at Discharge Family  Type of Home House  Prior Function  Level of Independence Independent  Vocation Full time employment  Vocation Requirements Works on farm, farming produce.  Cognition  Overall Cognitive Status Within Functional Limits for tasks assessed      10/26/18 1630  Subjective Assessment  Subjective  Gary Ferguson returns to our clinic for wound care after being discharged on 05/16/18 with compression socks. He reports ~ 7 weeks after being discharged his leg became red and swollen and a wound began developing. He feels it was created from the socks as they were extremely tight. He has a history of Rt LE swelling  for years that began after he had a DVT ~9.5 years ago in his Rt leg. The wound he was treated for during his last episode of care with our office was his first wound and this is his second. He reports he has been washing his leg and his PCP prescribed a cream that helped the redness and spots go away but he ran out of the cream and he leg worsened again. He is not allowed to apply the cream to open areas.  Patient and Family Stated Goals wound to heal  Date of Onset 06/27/18 (approximate)  Prior Treatments wound care in our office, d/c on 05/16/18  Evaluation and Treatment   Evaluation and Treatment Procedures Explained to Patient/Family Yes  Evaluation and Treatment Procedures agreed to  Wound / Incision (Open or Dehisced) 10/26/18 Venous stasis ulcer;Other (Comment) Leg Right;Lower;Circumferential redness of skin around entire circumference of Rt lower Leg  Date First Assessed/Time First Assessed: 10/26/18 1615   Wound Type: (c) Venous stasis ulcer;Other (Comment)  Location: Leg  Location Orientation: Right;Lower;Circumferential  Wound Description (Comments): redness of skin around entire circumference o...  Dressing Type Impregnated gauze (bismuth);Alginate;Compression wrap  Dressing Changed Changed  Dressing Status Old drainage  Dressing Change Frequency PRN  Site / Wound Assessment Red  % Wound base Red or Granulating 100%  Peri-wound Assessment Edema;Erythema (non-blanchable)  Wound Length (cm) 19.4 cm  Wound Width (cm) 26 cm (full circumference at ankle)  Wound Depth (cm) 0 cm  Wound Volume (cm^3) 0 cm^3  Wound Surface Area (cm^2) 504.4 cm^2  Drainage Amount Minimal  Drainage Description Serosanguineous  Non-staged Wound Description Partial thickness  Treatment Cleansed;Debridement (Selective)  Selective Debridement  Selective Debridement - Location Rt Lower Leg wound  Selective Debridement - Tools Used Forceps  Selective Debridement - Tissue Removed dried slough  Wound Therapy - Assess/Plan/Recommendations  Wound Therapy - Clinical Statement Gary Ferguson presents for evaluation of Right LE cellulitis. The wound has been present for ~ 4 months and he has been treating it with a prescription cream and washing his LE with soap and water. The cellulitis has wrapped around his entire lower leg and there are patches of open skin that is weeping serosanguineous drainage.s  His Rt LE has increased edema and he reports he stopped wearing his compression socks as he feels they were too tight and are what lead to the development of the ulcer. After cleansing and  debridement wound was dressed with xeroform moist environment and alginate applied over areas where wound weeps. Profore lite applied to address swelling and patient was educated on how to remove 1 layer at a time if his toes become blue, cold, or painful. He will continue to benefit from skilled PT interventions to promote moist environment and reduce infection risk for wound healing.    Wound Therapy - Functional Problem List difficulty walking and wearing work boots  Factors Delaying/Impairing Wound Healing Vascular compromise (hx of DVT)  Hydrotherapy Plan Debridement;Dressing change;Patient/family education  Wound Therapy - Frequency 2X / week  Wound Therapy - Current Recommendations PT  Wound Plan Continue cleansing/debridment. Continue with compression wrapping and consider ordering lower compression stocking rather than high compression socks at discharge.  Wound Therapy  Dressing  xeroform, alginate (over weeping areas) profore lite    Objective measurements completed on examination: See above findings.      10/26/18 1525  PT Education  Education Details Educated patient to keep dressing dry and intact. Educated him that if it becomes too tight  or his foot becomes cold or blue to remove 1 layer of dressing at a time and see if it resolves. Educated patient on plan to request stockings of a lower pressure to manage edema but not create unwanted pressure spots that could leave to wound developement.  Person(s) Educated Patient  Methods Explanation  Comprehension Verbalized understanding    PT Short Term Goals - 10/27/18 1404      PT SHORT TERM GOAL #1   Title  Patient's wound will have reduced in size by 50% or greater to indicate wound healing is progressing through proliferative phase.    Time  3    Period  Weeks    Status  New    Target Date  11/16/18        PT Long Term Goals - 10/26/18 1610      PT LONG TERM GOAL #1   Title  Patient will verbalize understanding of  importance of life long use of compression stockings to prevent ulcer recurence and will demosntrate proper donning/doffing of garment independently.    Time  6    Period  Weeks    Status  New    Target Date  12/07/18      PT LONG TERM GOAL #2   Title  Patient with have no signs of localized infection and skin with be fully epithelialized and healed.    Time  6    Period  Weeks    Status  New    Target Date  12/07/18           10/26/18 1605  Plan  Clinical Impression Statement Gary Ferguson presents for evaluation of Right LE cellulitis. The wound has been present for ~ 4 months and he has been treating it with a prescription cream and washing his LE with soap and water. The cellulitis has wrapped around his entire lower leg and there are patches of open skin that is weeping serosanguineous drainage.s  His Rt LE has increased edema and he reports he stopped wearing his compression socks as he feels they were too tight and are what lead to the development of the ulcer. After cleansing and debridement wound was dressed with xeroform moist environment and alginate applied over areas where wound weeps. Profore lite applied to address swelling and patient was educated on how to remove 1 layer at a time if his toes become blue, cold, or painful. He will continue to benefit from skilled PT interventions to promote moist environment and reduce infection risk for wound healing.    Personal Factors and Comorbidities Age;Sex  Examination-Activity Limitations Bathing;Dressing;Other (working on farm)  Pt will benefit from skilled therapeutic intervention in order to improve on the following deficits Pain;Decreased skin integrity;Increased edema  Stability/Clinical Decision Making Evolving/Moderate complexity  Clinical Decision Making Moderate  Rehab Potential Good  PT Frequency 2x / week  PT Duration 6 weeks  PT Treatment/Interventions Therapeutic exercise;Compression bandaging;Manual  techniques;Patient/family education;Taping;Other (comment) (skilled wound care techniques and low level laser)  PT Next Visit Plan see above  Consulted and Agree with Plan of Care Patient      Patient will benefit from skilled therapeutic intervention in order to improve the following deficits and impairments:  Pain, Decreased skin integrity, Increased edema  Visit Diagnosis: 1. Cellulitis of right lower leg   2. Localized edema   3. Ankle ulcer, right, with unspecified severity [facility])       Problem List Patient Active Problem List   Diagnosis Date Noted  .  Primary osteoarthritis of left hip 11/10/2015    Valentino Saxonachel Quinn-Brown, PT, DPT, [facility] Physical Therapist with [facility]  10/26/2018 5:37 PM    [facility] [address] [place], [place], [address] Phone: [phone]   Fax:  [phone]  Name: Gary Ferguson MRN: [id number] Date of Birth: 10/10/1955

## 2018-10-31 ENCOUNTER — Ambulatory Visit (HOSPITAL_COMMUNITY): Payer: Medicare HMO

## 2018-10-31 ENCOUNTER — Other Ambulatory Visit: Payer: Self-pay

## 2018-10-31 DIAGNOSIS — S91001A Unspecified open wound, right ankle, initial encounter: Secondary | ICD-10-CM | POA: Diagnosis not present

## 2018-10-31 DIAGNOSIS — L03115 Cellulitis of right lower limb: Secondary | ICD-10-CM | POA: Diagnosis not present

## 2018-10-31 DIAGNOSIS — R600 Localized edema: Secondary | ICD-10-CM

## 2018-10-31 DIAGNOSIS — L97319 Non-pressure chronic ulcer of right ankle with unspecified severity: Secondary | ICD-10-CM | POA: Diagnosis not present

## 2018-10-31 NOTE — Therapy (Signed)
Gary Ferguson, [place], [address] Phone: [phone]   Fax:  [phone]  Wound Care Therapy  Patient Details  Name: Gary Ferguson MRN: [id number] Date of Birth: Jan 26, 1956 Referring Provider (PT): Jill Alexanders, MD   Encounter Date: 10/31/2018  PT End of Session - 10/31/18 1605    Visit Number  2    Number of Visits  12    Date for PT Re-Evaluation  12/07/18    Authorization Type  [employer] Medicare HMO ($35 copay, no visit limit)    Authorization Time Period  10/26/18-12/07/18    Authorization - Visit Number  2    Authorization - Number of Visits  10    PT Start Time  1520    PT Stop Time  1558    PT Time Calculation (min)  38 min    Activity Tolerance  Patient tolerated treatment well    Behavior During Therapy  [facility] for tasks assessed/performed       Past Medical History:  Diagnosis Date  . Arthritis   . Chronic back pain   . Complication of anesthesia    Patient said that after surgery his throat closed up in 2011  . GERD (gastroesophageal reflux disease)   . History of DVT of lower extremity   . Hx of blood clots 2010  . Kidney stones   . Macular atrophy, retinal    bilateral  . Torn rotator cuff    right shoulder    Past Surgical History:  Procedure Laterality Date  . BACK SURGERY  1997  . BACK SURGERY  04/2009   3 separate surgies for infection   . COLONOSCOPY  07/21/2011   Procedure: COLONOSCOPY;  Surgeon: Daneil Dolin, MD;  [facility];  Service: Endoscopy;  Laterality: N/A;  12:45 PM  . TONSILLECTOMY    . TOTAL HIP ARTHROPLASTY Left 11/10/2015  . TOTAL HIP ARTHROPLASTY Left 11/10/2015   Procedure: TOTAL LEFT HIP ARTHROPLASTY ANTERIOR APPROACH;  Surgeon: Rod Can, MD;  Location: [place];  Service: Orthopedics;  Laterality: Left;    There were no vitals filed for this visit.   Subjective Assessment - 10/31/18 1623    Subjective  Pt arrived with dressings intact.  No  reports of pain currently.  Forgot the ointment and has not called pharmacy about refill antibiotics.                Wound Therapy - 10/31/18 1624    Subjective  Pt arrived with dressings intact.  No reports of pain currently.  Forgot the ointment and has not called pharmacy about refill antibiotics.    Patient and Family Stated Goals  wound to heal    Date of Onset  06/27/18   approximate   Prior Treatments  wound care in our office, d/c on 05/16/18    Evaluation and Treatment Procedures Explained to Patient/Family  Yes    Evaluation and Treatment Procedures  agreed to    Wound Properties Date First Assessed: 10/26/18 Time First Assessed: 2993 Wound Type: Venous stasis ulcer;Other (Comment) , cellultis  Location: Leg Location Orientation: Right;Lower;Circumferential Wound Description (Comments): redness of skin around entire circumference of Rt lower Leg Present on Admission: Yes   Dressing Type  Impregnated gauze (bismuth);Alginate;Compression wrap    Dressing Changed  Changed    Dressing Status  Old drainage    Dressing Change Frequency  PRN    Site / Wound Assessment  Red    %  Wound base Red or Granulating  100%    Peri-wound Assessment  Edema;Erythema (non-blanchable)    Drainage Amount  Minimal    Drainage Description  Serosanguineous    Non-staged Wound Description  Partial thickness    Treatment  Cleansed;Debridement (Selective)    Selective Debridement - Location  Rt Lower Leg wound    Selective Debridement - Tools Used  Forceps    Selective Debridement - Tissue Removed  dried slough    Wound Therapy - Clinical Statement  Upon removal of dressings noted leg continues to have redness and minimal heat, encouraged pt to refill antibiotics.  Selective debridement for removal of dried slough and dry skin to promote healing.  Continued wiht xeroform for moisture and alginate on medial and lateral aspect of ankle for drainage.  No reports of pain through session.     Wound  Therapy - Functional Problem List  difficulty walking and wearing work boots    Factors Delaying/Impairing Wound Healing  Vascular compromise   hx of DVT   Hydrotherapy Plan  Debridement;Dressing change;Patient/family education    Wound Therapy - Frequency  2X / week    Wound Therapy - Current Recommendations  PT    Wound Plan  Continue cleansing/debridment. Continue with compression wrapping and consider ordering lower compression stocking rather than high compression socks at discharge.    Dressing   xeroform, alginate (over weeping areas) profore lite                PT Short Term Goals - 10/27/18 1404      PT SHORT TERM GOAL #1   Title  Patient's wound will have reduced in size by 50% or greater to indicate wound healing is progressing through proliferative phase.    Time  3    Period  Weeks    Status  New    Target Date  11/16/18        PT Long Term Goals - 10/26/18 1610      PT LONG TERM GOAL #1   Title  Patient will verbalize understanding of importance of life long use of compression stockings to prevent ulcer recurence and will demosntrate proper donning/doffing of garment independently.    Time  6    Period  Weeks    Status  New    Target Date  12/07/18      PT LONG TERM GOAL #2   Title  Patient with have no signs of localized infection and skin with be fully epithelialized and healed.    Time  6    Period  Weeks    Status  New    Target Date  12/07/18              Patient will benefit from skilled therapeutic intervention in order to improve the following deficits and impairments:     Visit Diagnosis: 1. Localized edema   2. Ankle ulcer, right, with unspecified severity (HCC)   3. Open wound of right ankle, initial encounter   4. Cellulitis of right lower leg        Problem List Patient Active Problem List   Diagnosis Date Noted  . Primary osteoarthritis of left hip 11/10/2015   Becky Sax, LPTA; CBIS [phone]  Juel Burrow 10/31/2018, 4:28 PM   [facility] [address] [place], [place], [address] Phone: [phone]   Fax:  [phone]  Name: Gary Ferguson MRN: [id number] Date of Birth: 05/10/1955

## 2018-11-02 ENCOUNTER — Other Ambulatory Visit: Payer: Self-pay

## 2018-11-02 ENCOUNTER — Ambulatory Visit (HOSPITAL_COMMUNITY): Payer: Medicare HMO

## 2018-11-02 ENCOUNTER — Encounter (HOSPITAL_COMMUNITY): Payer: Self-pay

## 2018-11-02 DIAGNOSIS — S91001A Unspecified open wound, right ankle, initial encounter: Secondary | ICD-10-CM | POA: Diagnosis not present

## 2018-11-02 DIAGNOSIS — R600 Localized edema: Secondary | ICD-10-CM | POA: Diagnosis not present

## 2018-11-02 DIAGNOSIS — L03115 Cellulitis of right lower limb: Secondary | ICD-10-CM

## 2018-11-02 DIAGNOSIS — L97319 Non-pressure chronic ulcer of right ankle with unspecified severity: Secondary | ICD-10-CM | POA: Diagnosis not present

## 2018-11-02 NOTE — Therapy (Signed)
[place], [place], [address] Phone: [phone]   Fax:  [phone]  Wound Care Therapy  Patient Details  Name: Gary Ferguson MRN: [id number] Date of Birth: 1955/06/26 Referring Provider (PT): Jill Alexanders, MD   Encounter Date: 11/02/2018  PT End of Session - 11/02/18 1839    Visit Number  3    Number of Visits  12    Date for PT Re-Evaluation  12/07/18    Authorization Type  [employer] Medicare HMO ($35 copay, no visit limit)    Authorization Time Period  10/26/18-12/07/18    Authorization - Visit Number  3    Authorization - Number of Visits  10    PT Start Time  5009    PT Stop Time  1800    PT Time Calculation (min)  43 min    Activity Tolerance  Patient tolerated treatment well    Behavior During Therapy  [facility] for tasks assessed/performed       Past Medical History:  Diagnosis Date  . Arthritis   . Chronic back pain   . Complication of anesthesia    Patient said that after surgery his throat closed up in 2011  . GERD (gastroesophageal reflux disease)   . History of DVT of lower extremity   . Hx of blood clots 2010  . Kidney stones   . Macular atrophy, retinal    bilateral  . Torn rotator cuff    right shoulder    Past Surgical History:  Procedure Laterality Date  . BACK SURGERY  1997  . BACK SURGERY  04/2009   3 separate surgies for infection   . COLONOSCOPY  07/21/2011   Procedure: COLONOSCOPY;  Surgeon: Daneil Dolin, MD;  [facility];  Service: Endoscopy;  Laterality: N/A;  12:45 PM  . TONSILLECTOMY    . TOTAL HIP ARTHROPLASTY Left 11/10/2015  . TOTAL HIP ARTHROPLASTY Left 11/10/2015   Procedure: TOTAL LEFT HIP ARTHROPLASTY ANTERIOR APPROACH;  Surgeon: Rod Can, MD;  Location: [place];  Service: Orthopedics;  Laterality: Left;    There were no vitals filed for this visit.   Subjective Assessment - 11/02/18 1833    Subjective  Went by pharmacy and picked up antibiotics  earlier today.  Remembered the ointment this session.  Reports some tenderness medial aspect of ankle, maybe 1/10 pain scale                Wound Therapy - 11/02/18 1834    Subjective  Went by pharmacy and picked up antibiotics earlier today.  Remembered the ointment this session.  Reports some tenderness medial aspect of ankle, maybe 1/10 pain scale    Patient and Family Stated Goals  wound to heal    Date of Onset  06/27/18   approximate   Prior Treatments  wound care in our office, d/c on 05/16/18    Evaluation and Treatment Procedures Explained to Patient/Family  Yes    Evaluation and Treatment Procedures  agreed to    Wound Properties Date First Assessed: 10/26/18 Time First Assessed: 3818 Wound Type: Venous stasis ulcer;Other (Comment) , cellultis  Location: Leg Location Orientation: Right;Lower;Circumferential Wound Description (Comments): redness of skin around entire circumference of Rt lower Leg Present on Admission: Yes   Dressing Type  Impregnated gauze (bismuth);Alginate;Compression wrap    Dressing Changed  Changed    Dressing Status  Old drainage    Dressing Change Frequency  PRN  Site / Wound Assessment  Red    % Wound base Red or Granulating  100%    Peri-wound Assessment  Edema;Erythema (non-blanchable)    Drainage Amount  Minimal    Drainage Description  Serosanguineous    Non-staged Wound Description  Partial thickness    Treatment  Cleansed;Debridement (Selective)    Selective Debridement - Location  Rt Lower Leg wound    Selective Debridement - Tools Used  Forceps    Selective Debridement - Tissue Removed  dried slough    Wound Therapy - Clinical Statement  Pt reports improved tolerance for dressing removal with additional xeroform last session.  Continued selective debridement for removal of dry skin and slought on medial and lateral aspect of ankle.  Pt applied the ointment that MD instructed prior profore lite being wrapped.  Additional support given  around ankle for pain control.  Continues to have redness, no heat this session.      Wound Therapy - Functional Problem List  difficulty walking and wearing work boots    Factors Delaying/Impairing Wound Healing  Vascular compromise    Hydrotherapy Plan  Debridement;Dressing change;Patient/family education    Wound Therapy - Frequency  2X / week    Wound Therapy - Current Recommendations  PT    Wound Plan  Continue cleansing/debridment. Continue with compression wrapping and consider ordering lower compression stocking rather than high compression socks at discharge.    Dressing   xeroform, alginate (over weeping areas) profore lite                PT Short Term Goals - 10/27/18 1404      PT SHORT TERM GOAL #1   Title  Patient's wound will have reduced in size by 50% or greater to indicate wound healing is progressing through proliferative phase.    Time  3    Period  Weeks    Status  New    Target Date  11/16/18        PT Long Term Goals - 10/26/18 1610      PT LONG TERM GOAL #1   Title  Patient will verbalize understanding of importance of life long use of compression stockings to prevent ulcer recurence and will demosntrate proper donning/doffing of garment independently.    Time  6    Period  Weeks    Status  New    Target Date  12/07/18      PT LONG TERM GOAL #2   Title  Patient with have no signs of localized infection and skin with be fully epithelialized and healed.    Time  6    Period  Weeks    Status  New    Target Date  12/07/18              Patient will benefit from skilled therapeutic intervention in order to improve the following deficits and impairments:     Visit Diagnosis: 1. Localized edema   2. Ankle ulcer, right, with unspecified severity (HCC)   3. Cellulitis of right lower leg        Problem List Patient Active Problem List   Diagnosis Date Noted  . Primary osteoarthritis of left hip 11/10/2015   Becky Sax, LPTA;  CBIS [phone]  Juel Burrow 11/02/2018, 6:40 PM  [facility] [address] [place], [place], [address] Phone: [phone]   Fax:  [phone]  Name: Gary Ferguson MRN: [id number] Date of Birth: 1955/05/15

## 2018-11-03 ENCOUNTER — Telehealth (HOSPITAL_COMMUNITY): Payer: Self-pay | Admitting: Internal Medicine

## 2018-11-03 NOTE — Telephone Encounter (Signed)
11/03/18  pt called and wanted to reschedule to 8/3 if appt was still available and it was

## 2018-11-06 ENCOUNTER — Encounter

## 2018-11-06 ENCOUNTER — Ambulatory Visit (HOSPITAL_COMMUNITY): Payer: Medicare HMO | Attending: Internal Medicine | Admitting: Physical Therapy

## 2018-11-06 ENCOUNTER — Encounter (HOSPITAL_COMMUNITY): Payer: Self-pay | Admitting: Physical Therapy

## 2018-11-06 ENCOUNTER — Other Ambulatory Visit: Payer: Self-pay

## 2018-11-06 DIAGNOSIS — L97319 Non-pressure chronic ulcer of right ankle with unspecified severity: Secondary | ICD-10-CM | POA: Insufficient documentation

## 2018-11-06 DIAGNOSIS — L03115 Cellulitis of right lower limb: Secondary | ICD-10-CM | POA: Diagnosis present

## 2018-11-06 DIAGNOSIS — S91001A Unspecified open wound, right ankle, initial encounter: Secondary | ICD-10-CM | POA: Diagnosis present

## 2018-11-06 DIAGNOSIS — R600 Localized edema: Secondary | ICD-10-CM | POA: Diagnosis present

## 2018-11-06 NOTE — Therapy (Signed)
[facility] [address] [place], [place], [address] Phone: [phone]   Fax:  [phone]  Wound Care Therapy  Patient Details  Name: Gary Ferguson MRN: [id number] Date of Birth: 02/14/1956 Referring Provider (PT): Marlowe KaysFusco, Lawrence John, MD   Encounter Date: 11/06/2018  PT End of Session - 11/06/18 1054    Visit Number  4    Number of Visits  12    Date for PT Re-Evaluation  12/07/18    Authorization Type  [employer] Medicare HMO ($35 copay, no visit limit)    Authorization Time Period  10/26/18-12/07/18    Authorization - Visit Number  4    Authorization - Number of Visits  10    PT Start Time  1020    PT Stop Time  1050    PT Time Calculation (min)  30 min    Activity Tolerance  Patient tolerated treatment well    Behavior During Therapy  [facility] for tasks assessed/performed       Past Medical History:  Diagnosis Date  . Arthritis   . Chronic back pain   . Complication of anesthesia    Patient said that after surgery his throat closed up in 2011  . GERD (gastroesophageal reflux disease)   . History of DVT of lower extremity   . Hx of blood clots 2010  . Kidney stones   . Macular atrophy, retinal    bilateral  . Torn rotator cuff    right shoulder    Past Surgical History:  Procedure Laterality Date  . BACK SURGERY  1997  . BACK SURGERY  04/2009   3 separate surgies for infection   . COLONOSCOPY  07/21/2011   Procedure: COLONOSCOPY;  Surgeon: Corbin Adeobert M Rourk, MD;  [facility];  Service: Endoscopy;  Laterality: N/A;  12:45 PM  . TONSILLECTOMY    . TOTAL HIP ARTHROPLASTY Left 11/10/2015  . TOTAL HIP ARTHROPLASTY Left 11/10/2015   Procedure: TOTAL LEFT HIP ARTHROPLASTY ANTERIOR APPROACH;  Surgeon: Samson FredericBrian Swinteck, MD;  Location: [facility] OR;  Service: Orthopedics;  Laterality: Left;    There were no vitals filed for this visit.              Wound Therapy - 11/06/18 1050    Subjective  Pt states that his leg is  feeling a lot better.  He is anxious to get back into compression hose only without bandaging .     Patient and Family Stated Goals  wound to heal    Date of Onset  06/27/18   approximate   Prior Treatments  wound care in our office, d/c on 05/16/18    Pain Scale  0-10    Pain Score  0-No pain    Evaluation and Treatment Procedures Explained to Patient/Family  Yes    Evaluation and Treatment Procedures  agreed to    Wound Properties Date First Assessed: 10/26/18 Time First Assessed: 1615 Wound Type: Venous stasis ulcer;Other (Comment) , cellultis  Location: Leg Location Orientation: Right;Lower;Circumferential Wound Description (Comments): redness of skin around entire circumference of Rt lower Leg Present on Admission: Yes   Dressing Type  Impregnated gauze (bismuth);Alginate;Compression wrap    Dressing Changed  Changed    Dressing Status  Old drainage    Dressing Change Frequency  PRN    Site / Wound Assessment  Red    % Wound base Red or Granulating  100%    Peri-wound Assessment  Edema;Erythema (non-blanchable)  Drainage Amount  Scant    Drainage Description  Serous    Non-staged Wound Description  Partial thickness    Treatment  Cleansed;Debridement (Selective)    Selective Debridement - Location  Rt Lower Leg wound    Selective Debridement - Tools Used  Forceps    Selective Debridement - Tissue Removed  dried slough    Wound Therapy - Clinical Statement  Pt has minimal swelling in his LE at this time.  Wound is healing well.  Pt may be ready for compression garment next week.     Wound Therapy - Functional Problem List  difficulty walking and wearing work boots    Factors Delaying/Impairing Wound Healing  Vascular compromise    Hydrotherapy Plan  Debridement;Dressing change;Patient/family education    Wound Therapy - Frequency  2X / week    Wound Therapy - Current Recommendations  PT    Wound Plan  Measure for compression garment, (ankle, calf, leg length and shoe size), so pt  is able to order compression garment.     Dressing   xeroform,   profore lite                PT Short Term Goals - 11/06/18 1055      PT SHORT TERM GOAL #1   Title  Patient's wound will have reduced in size by 50% or greater to indicate wound healing is progressing through proliferative phase.    Time  3    Period  Weeks    Status  On-going    Target Date  11/16/18        PT Long Term Goals - 11/06/18 1055      PT LONG TERM GOAL #1   Title  Patient will verbalize understanding of importance of life long use of compression stockings to prevent ulcer recurence and will demosntrate proper donning/doffing of garment independently.    Time  6    Period  Weeks    Status  Achieved      PT LONG TERM GOAL #2   Title  Patient with have no signs of localized infection and skin with be fully epithelialized and healed.    Time  6    Period  Weeks    Status  On-going            Plan - 11/06/18 1055    Clinical Impression Statement  see above    Personal Factors and Comorbidities  Age;Sex    Examination-Activity Limitations  Bathing;Dressing;Other   working on farm   Stability/Clinical Decision Making  Evolving/Moderate complexity    Rehab Potential  Good    PT Frequency  2x / week    PT Duration  6 weeks    PT Treatment/Interventions  Therapeutic exercise;Compression bandaging;Manual techniques;Patient/family education;Taping;Other (comment)   skilled wound care techniques and low level laser   PT Next Visit Plan  see above    Consulted and Agree with Plan of Care  Patient       Patient will benefit from skilled therapeutic intervention in order to improve the following deficits and impairments:  Pain, Decreased skin integrity, Increased edema  Visit Diagnosis: 1. Localized edema   2. Ankle ulcer, right, with unspecified severity ([place])   3. Cellulitis of right lower leg   4. Open wound of right ankle, initial encounter        Problem List Patient Active  Problem List   Diagnosis Date Noted  . Primary osteoarthritis of left hip 11/10/2015  Virgina Organ, PT CLT [phone] 11/06/2018, 10:56 AM  [facility] [address] [place], [place], [address] Phone: [phone]   Fax:  [phone]  Name: Gary Ferguson MRN: [id number] Date of Birth: Oct 24, 1955

## 2018-11-07 ENCOUNTER — Ambulatory Visit (HOSPITAL_COMMUNITY): Payer: Medicare HMO | Admitting: Physical Therapy

## 2018-11-09 ENCOUNTER — Encounter (HOSPITAL_COMMUNITY): Payer: Self-pay | Admitting: Physical Therapy

## 2018-11-09 ENCOUNTER — Ambulatory Visit (HOSPITAL_COMMUNITY): Payer: Medicare HMO | Admitting: Physical Therapy

## 2018-11-09 ENCOUNTER — Other Ambulatory Visit: Payer: Self-pay

## 2018-11-09 DIAGNOSIS — L97319 Non-pressure chronic ulcer of right ankle with unspecified severity: Secondary | ICD-10-CM

## 2018-11-09 DIAGNOSIS — L03115 Cellulitis of right lower limb: Secondary | ICD-10-CM

## 2018-11-09 DIAGNOSIS — S91001A Unspecified open wound, right ankle, initial encounter: Secondary | ICD-10-CM | POA: Diagnosis not present

## 2018-11-09 DIAGNOSIS — R600 Localized edema: Secondary | ICD-10-CM

## 2018-11-09 NOTE — Therapy (Signed)
[facility] [address] [place], [place], [address] Phone: [phone]   Fax:  [phone]  Wound Care Therapy  Patient Details  Name: Gary Ferguson MRN: [id number] Date of Birth: 04/14/1955 Referring Provider (PT): Marlowe KaysFusco, Lawrence John, MD   Encounter Date: 11/09/2018  PT End of Session - 11/09/18 1505    Visit Number  5    Number of Visits  12    Date for PT Re-Evaluation  12/07/18    Authorization Type  [employer] Medicare HMO ($35 copay, no visit limit)    Authorization Time Period  10/26/18-12/07/18    Authorization - Visit Number  5    Authorization - Number of Visits  10    PT Start Time  1415    PT Stop Time  1445    PT Time Calculation (min)  30 min    Activity Tolerance  Patient tolerated treatment well    Behavior During Therapy  [facility] for tasks assessed/performed       Past Medical History:  Diagnosis Date  . Arthritis   . Chronic back pain   . Complication of anesthesia    Patient said that after surgery his throat closed up in 2011  . GERD (gastroesophageal reflux disease)   . History of DVT of lower extremity   . Hx of blood clots 2010  . Kidney stones   . Macular atrophy, retinal    bilateral  . Torn rotator cuff    right shoulder    Past Surgical History:  Procedure Laterality Date  . BACK SURGERY  1997  . BACK SURGERY  04/2009   3 separate surgies for infection   . COLONOSCOPY  07/21/2011   Procedure: COLONOSCOPY;  Surgeon: Corbin Adeobert M Rourk, MD;  [facility];  Service: Endoscopy;  Laterality: N/A;  12:45 PM  . TONSILLECTOMY    . TOTAL HIP ARTHROPLASTY Left 11/10/2015  . TOTAL HIP ARTHROPLASTY Left 11/10/2015   Procedure: TOTAL LEFT HIP ARTHROPLASTY ANTERIOR APPROACH;  Surgeon: Samson FredericBrian Swinteck, MD;  [facility];  Service: Orthopedics;  Laterality: Left;    There were no vitals filed for this visit.     Wound Therapy - 11/09/18 1449    Subjective  PT states no problem with the bandage.  Eager to  be able to wear compression hose instead of the bandaging     Patient and Family Stated Goals  wound to heal    Date of Onset  06/27/18   approximate   Prior Treatments  wound care in our office, d/c on 05/16/18    Pain Scale  0-10    Pain Score  0-No pain    Evaluation and Treatment Procedures Explained to Patient/Family  Yes    Evaluation and Treatment Procedures  agreed to    Wound Properties Date First Assessed: 10/26/18 Time First Assessed: 1615 Wound Type: Venous stasis ulcer;Other (Comment) , cellultis  Location: Leg Location Orientation: Right;Lower;Circumferential Wound Description (Comments): redness of skin around entire circumference of Rt lower Leg Present on Admission: Yes   Dressing Type  Impregnated gauze (bismuth);Alginate;Compression wrap    Dressing Changed  Changed    Dressing Status  Old drainage    Dressing Change Frequency  PRN    Site / Wound Assessment  Red    % Wound base Red or Granulating  100%    Peri-wound Assessment  Erythema (non-blanchable)    Drainage Amount  Scant    Drainage Description  Serous  Non-staged Wound Description  Partial thickness    Treatment  Cleansed    Selective Debridement - Location  --    Selective Debridement - Tools Used  Other (comment)   4x4    Selective Debridement - Tissue Removed  dried slough   manual completed, retromassage to decrease edema    Wound Therapy - Clinical Statement  Pt has minimal drainage from small scattered areas along medial aspect of ankle.  Other than this wound has granulated.  Therapist gave pt measurement of leg to obtain compression stocking as well as sent an order to MD for compression stockings as pt insurance should cover due to having an open wound.  PT may benefit from a compression pump which could be ordered via [facility] with no cost to the pt.      Wound Therapy - Functional Problem List  difficulty walking and wearing work boots    Factors Delaying/Impairing Wound Healing  Vascular  compromise    Hydrotherapy Plan  Debridement;Dressing change;Patient/family education    Wound Therapy - Frequency  2X / week    Wound Therapy - Current Recommendations  PT    Wound Plan  Speak to pt about compression pump to see if he would like [place] to start the paper work needed to obtain the pump.      Dressing   xeroform,   profore lite                PT Short Term Goals - 11/09/18 1506      PT SHORT TERM GOAL #1   Title  Patient's wound will have reduced in size by 50% or greater to indicate wound healing is progressing through proliferative phase.    Time  3    Period  Weeks    Status  On-going    Target Date  11/16/18        PT Long Term Goals - 11/09/18 1507      PT LONG TERM GOAL #1   Title  Patient will verbalize understanding of importance of life long use of compression stockings to prevent ulcer recurence and will demosntrate proper donning/doffing of garment independently.    Time  6    Period  Weeks    Status  Achieved      PT LONG TERM GOAL #2   Title  Patient with have no signs of localized infection and skin with be fully epithelialized and healed.    Time  6    Period  Weeks    Status  On-going            Plan - 11/09/18 1506    Clinical Impression Statement  see above    Personal Factors and Comorbidities  Age;Sex    Examination-Activity Limitations  Bathing;Dressing;Other   working on farm   Stability/Clinical Decision Making  Evolving/Moderate complexity    Rehab Potential  Good    PT Frequency  2x / week    PT Duration  6 weeks    PT Treatment/Interventions  Therapeutic exercise;Compression bandaging;Manual techniques;Patient/family education;Taping;Other (comment)   skilled wound care techniques and low level laser   PT Next Visit Plan  see above    Consulted and Agree with Plan of Care  Patient       Patient will benefit from skilled therapeutic intervention in order to improve the following deficits and impairments:  Pain,  Decreased skin integrity, Increased edema  Visit Diagnosis: 1. Localized edema   2. Ankle ulcer, right,  with unspecified severity ([place])   3. Cellulitis of right lower leg        Problem List Patient Active Problem List   Diagnosis Date Noted  . Primary osteoarthritis of left hip 11/10/2015   Rayetta Humphrey, PT CLT [phone] 11/09/2018, 3:07 PM  [place], [place], [address] Phone: [phone]   Fax:  [phone]  Name: ELKIN BELFIELD MRN: [id number] Date of Birth: June 22, 1955

## 2018-11-13 ENCOUNTER — Other Ambulatory Visit: Payer: Self-pay

## 2018-11-13 ENCOUNTER — Ambulatory Visit (HOSPITAL_COMMUNITY): Payer: Medicare HMO | Admitting: Physical Therapy

## 2018-11-13 ENCOUNTER — Encounter (HOSPITAL_COMMUNITY): Payer: Self-pay | Admitting: Physical Therapy

## 2018-11-13 DIAGNOSIS — L97319 Non-pressure chronic ulcer of right ankle with unspecified severity: Secondary | ICD-10-CM | POA: Diagnosis not present

## 2018-11-13 DIAGNOSIS — L03115 Cellulitis of right lower limb: Secondary | ICD-10-CM | POA: Diagnosis not present

## 2018-11-13 DIAGNOSIS — R600 Localized edema: Secondary | ICD-10-CM

## 2018-11-13 DIAGNOSIS — S91001A Unspecified open wound, right ankle, initial encounter: Secondary | ICD-10-CM

## 2018-11-13 NOTE — Therapy (Signed)
[place], [place], [address] Phone: [phone]   Fax:  [phone]  Wound Care Therapy  Patient Details  Name: Gary Ferguson MRN: [id number] Date of Birth: 05-05-1955 Referring Provider (PT): Jill Alexanders, MD   Encounter Date: 11/13/2018  PT End of Session - 11/13/18 1559    Visit Number  6    Number of Visits  12    Date for PT Re-Evaluation  12/07/18    Authorization Type  [employer] Medicare HMO ($35 copay, no visit limit)    Authorization Time Period  10/26/18-12/07/18    Authorization - Visit Number  6    Authorization - Number of Visits  10    PT Start Time  1416    PT Stop Time  1455    PT Time Calculation (min)  39 min    Activity Tolerance  Patient tolerated treatment well    Behavior During Therapy  [facility] for tasks assessed/performed       Past Medical History:  Diagnosis Date  . Arthritis   . Chronic back pain   . Complication of anesthesia    Patient said that after surgery his throat closed up in 2011  . GERD (gastroesophageal reflux disease)   . History of DVT of lower extremity   . Hx of blood clots 2010  . Kidney stones   . Macular atrophy, retinal    bilateral  . Torn rotator cuff    right shoulder    Past Surgical History:  Procedure Laterality Date  . BACK SURGERY  1997  . BACK SURGERY  04/2009   3 separate surgies for infection   . COLONOSCOPY  07/21/2011   Procedure: COLONOSCOPY;  Surgeon: Daneil Dolin, MD;  [facility];  Service: Endoscopy;  Laterality: N/A;  12:45 PM  . TONSILLECTOMY    . TOTAL HIP ARTHROPLASTY Left 11/10/2015  . TOTAL HIP ARTHROPLASTY Left 11/10/2015   Procedure: TOTAL LEFT HIP ARTHROPLASTY ANTERIOR APPROACH;  Surgeon: Rod Can, MD;  Location: [facility];  Service: Orthopedics;  Laterality: Left;    There were no vitals filed for this visit.              Wound Therapy - 11/13/18 1551    Subjective  Had issues with compression  wrap sliding down and causing some pain over the weekend. Wasn't sure if insurance would cover compression stocking and bought some myself.     Patient and Family Stated Goals  wound to heal    Date of Onset  06/27/18   approximate   Prior Treatments  wound care in our office, d/c on 05/16/18    Pain Scale  0-10    Pain Score  3     Pain Type  Acute pain    Pain Location  Leg    Pain Orientation  Right    Pain Descriptors / Indicators  Discomfort    Pain Onset  On-going    Patients Stated Pain Goal  0    Pain Intervention(s)  Other (Comment)   removed profore    Evaluation and Treatment Procedures Explained to Patient/Family  Yes    Evaluation and Treatment Procedures  agreed to    Wound Properties Date First Assessed: 10/26/18 Time First Assessed: 1660 Wound Type: Venous stasis ulcer;Other (Comment) , cellultis  Location: Leg Location Orientation: Right;Lower;Circumferential Wound Description (Comments): redness of skin around entire circumference of Rt lower Leg Present on Admission: Yes  Dressing Type  Alginate;Compression wrap;Impregnated gauze (bismuth)    Dressing Changed  Changed    Dressing Status  Old drainage    Dressing Change Frequency  PRN    Site / Wound Assessment  Red    % Wound base Red or Granulating  100%    Peri-wound Assessment  Erythema (non-blanchable)    Drainage Amount  Scant    Drainage Description  Serous    Non-staged Wound Description  Partial thickness    Treatment  Cleansed;Debridement (Selective);Packing (Impregnated strip)    Selective Debridement - Location  R lower leg     Selective Debridement - Tools Used  Forceps   and dry 4x4    Selective Debridement - Tissue Removed  dead skin and slough     Wound Therapy - Clinical Statement  [name] arrives today very pleasantly reporting that his compression wrap has fallen down and is causing some pain; education provided that compression stockings can/should be cut off if causing pain and/or if toes  turn color, he gives verbal understanding. Continued debriding dry skin/dry tissues and dressed wound with mix of xeroform and vasoline for tissue hydration and to promote healing. Dressed using Profore Lite wrap and netting. He reports he had already bought his compression stockings out of pocket, advised him that since we now have signed MD order for compression wraps to call his insurance and ask if they will cover this equipment with order.    Wound Therapy - Functional Problem List  difficulty walking and wearing work boots     Factors Delaying/Impairing Wound Healing  Vascular compromise    Hydrotherapy Plan  Debridement;Dressing change;Patient/family education    Wound Therapy - Frequency  2X / week    Wound Therapy - Current Recommendations  PT    Wound Plan  continue xeroform and profore lite; f/u on getting compression wraps via MD order     Dressing   xeroform, profore lite               PT Education - 11/13/18 1558    Education Details  educated that insurance may cover compression wraps with signed MD order, advised him to call insurance; if leg starts hurting or foot/toes turn colors remove compression wrap    Person(s) Educated  Patient    Methods  Explanation    Comprehension  Verbalized understanding;Need further instruction       PT Short Term Goals - 11/09/18 1506      PT SHORT TERM GOAL #1   Title  Patient's wound will have reduced in size by 50% or greater to indicate wound healing is progressing through proliferative phase.    Time  3    Period  Weeks    Status  On-going    Target Date  11/16/18        PT Long Term Goals - 11/09/18 1507      PT LONG TERM GOAL #1   Title  Patient will verbalize understanding of importance of life long use of compression stockings to prevent ulcer recurence and will demosntrate proper donning/doffing of garment independently.    Time  6    Period  Weeks    Status  Achieved      PT LONG TERM GOAL #2   Title  Patient  with have no signs of localized infection and skin with be fully epithelialized and healed.    Time  6    Period  Weeks    Status  On-going  Patient will benefit from skilled therapeutic intervention in order to improve the following deficits and impairments:     Visit Diagnosis: 1. Localized edema   2. Ankle ulcer, right, with unspecified severity ([facility])   3. Cellulitis of right lower leg   4. Open wound of right ankle, initial encounter        Problem List Patient Active Problem List   Diagnosis Date Noted  . Primary osteoarthritis of left hip 11/10/2015    Nedra Hai PT, DPT, CBIS  Supplemental Physical Therapist [facility]    Pager [phone] Acute Rehab Office [phone]   [facility] [address] [place], [place], [address] Phone: [phone]   Fax:  [phone]  Name: Gary Ferguson MRN: [id number] Date of Birth: [birth year]

## 2018-11-14 DIAGNOSIS — Z471 Aftercare following joint replacement surgery: Secondary | ICD-10-CM | POA: Diagnosis not present

## 2018-11-14 DIAGNOSIS — Z96642 Presence of left artificial hip joint: Secondary | ICD-10-CM | POA: Diagnosis not present

## 2018-11-16 ENCOUNTER — Other Ambulatory Visit: Payer: Self-pay

## 2018-11-16 ENCOUNTER — Encounter (HOSPITAL_COMMUNITY): Payer: Self-pay

## 2018-11-16 ENCOUNTER — Ambulatory Visit (HOSPITAL_COMMUNITY): Payer: Medicare HMO

## 2018-11-16 DIAGNOSIS — L97319 Non-pressure chronic ulcer of right ankle with unspecified severity: Secondary | ICD-10-CM | POA: Diagnosis not present

## 2018-11-16 DIAGNOSIS — L03115 Cellulitis of right lower limb: Secondary | ICD-10-CM

## 2018-11-16 DIAGNOSIS — R600 Localized edema: Secondary | ICD-10-CM | POA: Diagnosis not present

## 2018-11-16 DIAGNOSIS — S91001A Unspecified open wound, right ankle, initial encounter: Secondary | ICD-10-CM | POA: Diagnosis not present

## 2018-11-16 NOTE — Therapy (Signed)
[place], [place], [address] Phone: [phone]   Fax:  [phone]  Wound Care Therapy  Patient Details  Name: Gary Ferguson MRN: [id number] Date of Birth: 04/08/1955 Referring Provider (PT): Jill Alexanders, MD   Encounter Date: 11/16/2018  PT End of Session - 11/16/18 1805    Visit Number  7    Number of Visits  12    Date for PT Re-Evaluation  12/07/18    Authorization Type  [employer] Medicare HMO ($35 copay, no visit limit)    Authorization Time Period  10/26/18-12/07/18    Authorization - Visit Number  7    Authorization - Number of Visits  10    PT Start Time  1720    PT Stop Time  1750    PT Time Calculation (min)  30 min    Activity Tolerance  Patient tolerated treatment well    Behavior During Therapy  [facility] for tasks assessed/performed       Past Medical History:  Diagnosis Date  . Arthritis   . Chronic back pain   . Complication of anesthesia    Patient said that after surgery his throat closed up in 2011  . GERD (gastroesophageal reflux disease)   . History of DVT of lower extremity   . Hx of blood clots 2010  . Kidney stones   . Macular atrophy, retinal    bilateral  . Torn rotator cuff    right shoulder    Past Surgical History:  Procedure Laterality Date  . BACK SURGERY  1997  . BACK SURGERY  04/2009   3 separate surgies for infection   . COLONOSCOPY  07/21/2011   Procedure: COLONOSCOPY;  Surgeon: Daneil Dolin, MD;  [facility];  Service: Endoscopy;  Laterality: N/A;  12:45 PM  . TONSILLECTOMY    . TOTAL HIP ARTHROPLASTY Left 11/10/2015  . TOTAL HIP ARTHROPLASTY Left 11/10/2015   Procedure: TOTAL LEFT HIP ARTHROPLASTY ANTERIOR APPROACH;  Surgeon: Rod Can, MD;  Location: [facility];  Service: Orthopedics;  Laterality: Left;    There were no vitals filed for this visit.   Subjective Assessment - 11/16/18 1759    Subjective  Pt reports dressings have slid down and  some tenderness, no pain scale given today    Currently in Pain?  No/denies                Wound Therapy - 11/16/18 1800    Subjective  Pt reports dressings have slid down and some tenderness, no pain scale given today.  Pt reports he has the compression hose but no pump.    Patient and Family Stated Goals  wound to heal    Date of Onset  06/27/18   approximate   Prior Treatments  wound care in our office, d/c on 05/16/18    Pain Scale  0-10    Pain Score  0-No pain    Evaluation and Treatment Procedures Explained to Patient/Family  Yes    Evaluation and Treatment Procedures  agreed to    Wound Properties Date First Assessed: 10/26/18 Time First Assessed: 4765 Wound Type: Venous stasis ulcer;Other (Comment) , cellultis  Location: Leg Location Orientation: Right;Lower;Circumferential Wound Description (Comments): redness of skin around entire circumference of Rt lower Leg Present on Admission: Yes   Dressing Type  Impregnated gauze (bismuth);Compression wrap   xeroform and profore lite   Dressing Changed  Changed    Dressing Status  Old drainage    Dressing Change Frequency  PRN    Site / Wound Assessment  Red    % Wound base Red or Granulating  100%    Peri-wound Assessment  Erythema (non-blanchable)    Drainage Amount  Scant    Drainage Description  Serous    Non-staged Wound Description  Partial thickness    Treatment  Cleansed;Debridement (Selective)    Selective Debridement - Location  R lower leg     Selective Debridement - Tools Used  Forceps    Selective Debridement - Tissue Removed  dead skin and slough     Wound Therapy - Clinical Statement  LE with bottleneck due to dressings slid down with noted increased edema proximal calf.  Selective debridement for removal of dead/dry skin wound bed and perimeter.  Minimal weeping noted on medial aspect of ankle.  Continued with xeroform and profore lite for edema control with additional ABD placed on posterior ankle for cone  shape to reduce sliding down.  Encouraged pt to bring compression garmets wiht him next week.     Wound Therapy - Functional Problem List  difficulty walking and wearing work boots     Factors Delaying/Impairing Wound Healing  Vascular compromise    Hydrotherapy Plan  Debridement;Dressing change;Patient/family education    Wound Therapy - Frequency  2X / week    Wound Therapy - Current Recommendations  PT    Wound Plan  continue xeroform and profore lite; f/u on getting compression wraps via MD order     Dressing   xeroform, profore lite                 PT Short Term Goals - 11/09/18 1506      PT SHORT TERM GOAL #1   Title  Patient's wound will have reduced in size by 50% or greater to indicate wound healing is progressing through proliferative phase.    Time  3    Period  Weeks    Status  On-going    Target Date  11/16/18        PT Long Term Goals - 11/09/18 1507      PT LONG TERM GOAL #1   Title  Patient will verbalize understanding of importance of life long use of compression stockings to prevent ulcer recurence and will demosntrate proper donning/doffing of garment independently.    Time  6    Period  Weeks    Status  Achieved      PT LONG TERM GOAL #2   Title  Patient with have no signs of localized infection and skin with be fully epithelialized and healed.    Time  6    Period  Weeks    Status  On-going              Patient will benefit from skilled therapeutic intervention in order to improve the following deficits and impairments:     Visit Diagnosis: 1. Ankle ulcer, right, with unspecified severity (HCC)   2. Cellulitis of right lower leg   3. Localized edema        Problem List Patient Active Problem List   Diagnosis Date Noted  . Primary osteoarthritis of left hip 11/10/2015   Becky Sax, LPTA; CBIS [phone]  Juel Burrow 11/16/2018, 6:08 PM  [facility] [address]  [place], [place], [address] Phone: [phone]   Fax:  [phone]  Name: Gary Ferguson MRN: [id number] Date of  Birth: 02/01/1956

## 2018-11-20 ENCOUNTER — Ambulatory Visit (HOSPITAL_COMMUNITY): Payer: Medicare HMO | Admitting: Physical Therapy

## 2018-11-20 ENCOUNTER — Encounter (HOSPITAL_COMMUNITY): Payer: Self-pay | Admitting: Physical Therapy

## 2018-11-20 ENCOUNTER — Other Ambulatory Visit: Payer: Self-pay

## 2018-11-20 DIAGNOSIS — L97319 Non-pressure chronic ulcer of right ankle with unspecified severity: Secondary | ICD-10-CM | POA: Diagnosis not present

## 2018-11-20 DIAGNOSIS — S91001A Unspecified open wound, right ankle, initial encounter: Secondary | ICD-10-CM | POA: Diagnosis not present

## 2018-11-20 DIAGNOSIS — R600 Localized edema: Secondary | ICD-10-CM | POA: Diagnosis not present

## 2018-11-20 DIAGNOSIS — L03115 Cellulitis of right lower limb: Secondary | ICD-10-CM | POA: Diagnosis not present

## 2018-11-20 NOTE — Therapy (Signed)
[facility] [address] [place], [place], [address] Phone: [phone]   Fax:  [phone]  Wound Care Therapy  Patient Details  Name: Gary Ferguson MRN: [id number] Date of Birth: 05/20/1955 Referring Provider (PT): Marlowe KaysFusco, Lawrence John, MD   Encounter Date: 11/20/2018  PT End of Session - 11/20/18 1657    Visit Number  8    Number of Visits  12    Date for PT Re-Evaluation  12/07/18    Authorization Type  [employer] Medicare HMO ($35 copay, no visit limit)    Authorization Time Period  10/26/18-12/07/18    Authorization - Visit Number  8    Authorization - Number of Visits  10    PT Start Time  1606    PT Stop Time  1644    PT Time Calculation (min)  38 min    Activity Tolerance  Patient tolerated treatment well    Behavior During Therapy  [facility] assessed/[facility]       Past Medical History:  Diagnosis Date  . Arthritis   . Chronic back pain   . Complication of anesthesia    Patient said that after surgery his throat closed up in 2011  . GERD (gastroesophageal reflux disease)   . History of DVT of lower extremity   . Hx of blood clots 2010  . Kidney stones   . Macular atrophy, retinal    bilateral  . Torn rotator cuff    right shoulder    Past Surgical History:  Procedure Laterality Date  . BACK SURGERY  1997  . BACK SURGERY  04/2009   3 separate surgies for infection   . COLONOSCOPY  07/21/2011   Procedure: COLONOSCOPY;  Surgeon: Corbin Adeobert M Rourk, MD;  [facility];  Service: Endoscopy;  Laterality: N/A;  12:45 PM  . TONSILLECTOMY    . TOTAL HIP ARTHROPLASTY Left 11/10/2015  . TOTAL HIP ARTHROPLASTY Left 11/10/2015   Procedure: TOTAL LEFT HIP ARTHROPLASTY ANTERIOR APPROACH;  Surgeon: Samson FredericBrian Swinteck, MD;  [facility];  Service: Orthopedics;  Laterality: Left;    There were no vitals filed for this visit.              Wound Therapy - 11/20/18 1654    Subjective  Dressings are feeling good,  no complaints today     Patient and Family Stated Goals  wound to heal    Date of Onset  06/27/18   approximate   Prior Treatments  wound care in our office, d/c on 05/16/18    Pain Scale  0-10    Pain Score  0-No pain    Evaluation and Treatment Procedures Explained to Patient/Family  Yes    Evaluation and Treatment Procedures  agreed to    Wound Properties Date First Assessed: 10/26/18 Time First Assessed: 1615 Wound Type: Venous stasis ulcer;Other (Comment) , cellultis  Location: Leg Location Orientation: Right;Lower;Circumferential Wound Description (Comments): redness of skin around entire circumference of Rt lower Leg Present on Admission: Yes   Dressing Type  Compression wrap;Impregnated gauze (bismuth)    Dressing Changed  Changed    Dressing Status  Old drainage    Dressing Change Frequency  PRN    Site / Wound Assessment  Red    % Wound base Red or Granulating  100%    Peri-wound Assessment  Erythema (non-blanchable)    Drainage Amount  Scant    Drainage Description  Serous    Non-staged Wound  Description  Partial thickness    Treatment  Cleansed;Debridement (Selective)    Selective Debridement - Location  R lower leg     Selective Debridement - Tools Used  Forceps    Selective Debridement - Tissue Removed  dead skin and slough     Wound Therapy - Clinical Statement  Gary Ferguson arrives in good spirits today, reports he is feeling well and his wrap held up well over the weekend. Upon removal of profore lite wrap, wound appears to be improving and appears less inflamed and irritated today as a whole. Able to debride a large amount of dead skin today, and continued with profore lite wrap for edema management and control. Also continued to reinforce education regarding cutting off profore lite wrap if he has color changes or sudden lack of sensation in his toes.    Wound Therapy - Functional Problem List  difficulty walking adn wearing work boots     Factors Delaying/Impairing Wound  Healing  Vascular compromise    Hydrotherapy Plan  Debridement;Dressing change;Patient/family education    Wound Therapy - Frequency  2X / week    Wound Therapy - Current Recommendations  PT    Wound Plan  continue xeroform, profore lite    Dressing   xeroform, compression wraps               PT Education - 11/20/18 1657    Education Details  cut off wrap if toes suddenly lose feeling or change color    Person(s) Educated  Patient    Methods  Explanation    Comprehension  Verbalized understanding       PT Short Term Goals - 11/09/18 1506      PT SHORT TERM GOAL #1   Title  Patient's wound will have reduced in size by 50% or greater to indicate wound healing is progressing through proliferative phase.    Time  3    Period  Weeks    Status  On-going    Target Date  11/16/18        PT Long Term Goals - 11/09/18 1507      PT LONG TERM GOAL #1   Title  Patient will verbalize understanding of importance of life long use of compression stockings to prevent ulcer recurence and will demosntrate proper donning/doffing of garment independently.    Time  6    Period  Weeks    Status  Achieved      PT LONG TERM GOAL #2   Title  Patient with have no signs of localized infection and skin with be fully epithelialized and healed.    Time  6    Period  Weeks    Status  On-going              Patient will benefit from skilled therapeutic intervention in order to improve the following deficits and impairments:     Visit Diagnosis: 1. Ankle ulcer, right, with unspecified severity (HCC)   2. Cellulitis of right lower leg   3. Localized edema   4. Open wound of right ankle, initial encounter        Problem List Patient Active Problem List   Diagnosis Date Noted  . Primary osteoarthritis of left hip 11/10/2015    Nedra Hai PT, DPT, CBIS  Supplemental Physical Therapist [facility]    Pager [phone] Acute Rehab Office [phone]   [facility] [address] [place], [place], [address] Phone:  [phone]   Fax:  [phone]  Name: Gary Ferguson MRN: [id number] Date of Birth: 1955/08/11

## 2018-11-23 ENCOUNTER — Other Ambulatory Visit: Payer: Self-pay

## 2018-11-23 ENCOUNTER — Ambulatory Visit (HOSPITAL_COMMUNITY): Payer: Medicare HMO

## 2018-11-23 DIAGNOSIS — L97319 Non-pressure chronic ulcer of right ankle with unspecified severity: Secondary | ICD-10-CM

## 2018-11-23 DIAGNOSIS — L03115 Cellulitis of right lower limb: Secondary | ICD-10-CM

## 2018-11-23 DIAGNOSIS — R600 Localized edema: Secondary | ICD-10-CM | POA: Diagnosis not present

## 2018-11-23 DIAGNOSIS — S91001A Unspecified open wound, right ankle, initial encounter: Secondary | ICD-10-CM | POA: Diagnosis not present

## 2018-11-23 NOTE — Therapy (Signed)
[facility] [address] [place], [place], [address] Phone: [phone]   Fax:  [phone]  Wound Care Therapy  Patient Details  Name: Gary Ferguson MRN: [id number] Date of Birth: Mar 26, 1956 Referring Provider (PT): Marlowe Kays, MD   Encounter Date: 11/23/2018  PT End of Session - 11/23/18 1803    Visit Number  9    Number of Visits  12    Date for PT Re-Evaluation  12/07/18    Authorization Type  [employer] Medicare HMO ($35 copay, no visit limit)    Authorization Time Period  10/26/18-12/07/18    Authorization - Visit Number  9    Authorization - Number of Visits  10    PT Start Time  1716    PT Stop Time  1740    PT Time Calculation (min)  24 min    Activity Tolerance  Patient tolerated treatment well    Behavior During Therapy  [facility] for tasks assessed/performed       Past Medical History:  Diagnosis Date  . Arthritis   . Chronic back pain   . Complication of anesthesia    Patient said that after surgery his throat closed up in 2011  . GERD (gastroesophageal reflux disease)   . History of DVT of lower extremity   . Hx of blood clots 2010  . Kidney stones   . Macular atrophy, retinal    bilateral  . Torn rotator cuff    right shoulder    Past Surgical History:  Procedure Laterality Date  . BACK SURGERY  1997  . BACK SURGERY  04/2009   3 separate surgies for infection   . COLONOSCOPY  07/21/2011   Procedure: COLONOSCOPY;  Surgeon: [name], MD;  [facility];  Service: Endoscopy;  Laterality: N/A;  12:45 PM  . TONSILLECTOMY    . TOTAL HIP ARTHROPLASTY Left 11/10/2015  . TOTAL HIP ARTHROPLASTY Left 11/10/2015   Procedure: TOTAL LEFT HIP ARTHROPLASTY ANTERIOR APPROACH;  Surgeon: [name], MD;  [facility];  Service: Orthopedics;  Laterality: Left;    There were no vitals filed for this visit.   Subjective Assessment - 11/23/18 1759    Subjective  Pt arrived with dressing intact, no reports  of pain today.  Pt also brought compression hose with him today, hopeful wound to heal                Wound Therapy - 11/23/18 1800    Subjective  Pt arrived with dressing intact, no reports of pain today.  Pt also brought compression hose with him today, hopeful wound to heal    Patient and Family Stated Goals  wound to heal    Date of Onset  06/27/18   approximate   Prior Treatments  wound care in our office, d/c on 05/16/18    Pain Scale  0-10    Pain Score  0-No pain    Evaluation and Treatment Procedures Explained to Patient/Family  Yes    Evaluation and Treatment Procedures  agreed to    Wound Properties Date First Assessed: 10/26/18 Time First Assessed: 1615 Wound Type: Venous stasis ulcer;Other (Comment) , cellultis  Location: Leg Location Orientation: Right;Lower;Circumferential Wound Description (Comments): redness of skin around entire circumference of Rt lower Leg Present on Admission: Yes   Dressing Type  Compression wrap;Impregnated gauze (bismuth)    Dressing Changed  Changed    Dressing Status  Old drainage  Dressing Change Frequency  PRN    Site / Wound Assessment  Red    % Wound base Red or Granulating  100%    Peri-wound Assessment  Erythema (non-blanchable)    Drainage Amount  Scant    Drainage Description  Serous    Treatment  Cleansed;Debridement (Selective)    Selective Debridement - Location  R lower leg     Selective Debridement - Tools Used  Forceps    Selective Debridement - Tissue Removed  dead skin and slough     Wound Therapy - Clinical Statement  Progressing well with decreased edema, scant drainage on medial aspect of calf/ankle.  Cleansed and removed dead skin and slough on medial portion and continued with profore lite compression for edema control.  Pt encouraged to bring compression hose with next session.    Wound Therapy - Functional Problem List  difficulty walking adn wearing work boots     Factors Delaying/Impairing Wound Healing   Vascular compromise    Hydrotherapy Plan  Debridement;Dressing change;Patient/family education    Wound Therapy - Frequency  2X / week    Wound Therapy - Current Recommendations  PT    Wound Plan  10th visit next session.  Resume alginate on medial aspect to address draininage, with mositure and xeroform, profore lite    Dressing   xeroform, compression wraps (profore lite)                PT Short Term Goals - 11/09/18 1506      PT SHORT TERM GOAL #1   Title  Patient's wound will have reduced in size by 50% or greater to indicate wound healing is progressing through proliferative phase.    Time  3    Period  Weeks    Status  On-going    Target Date  11/16/18        PT Long Term Goals - 11/09/18 1507      PT LONG TERM GOAL #1   Title  Patient will verbalize understanding of importance of life long use of compression stockings to prevent ulcer recurence and will demosntrate proper donning/doffing of garment independently.    Time  6    Period  Weeks    Status  Achieved      PT LONG TERM GOAL #2   Title  Patient with have no signs of localized infection and skin with be fully epithelialized and healed.    Time  6    Period  Weeks    Status  On-going              Patient will benefit from skilled therapeutic intervention in order to improve the following deficits and impairments:     Visit Diagnosis: Ankle ulcer, right, with unspecified severity (HCC)  Cellulitis of right lower leg  Localized edema     Problem List Patient Active Problem List   Diagnosis Date Noted  . Primary osteoarthritis of left hip 11/10/2015   [name], LPTA; CBIS [phone]  [name] 11/23/2018, 6:06 PM  [place], [place], [address] Phone: [phone]   Fax:  [phone]  Name: Gary Ferguson MRN: [id number] Date of Birth: January 30, 1956

## 2018-11-27 ENCOUNTER — Encounter (HOSPITAL_COMMUNITY): Payer: Self-pay | Admitting: Physical Therapy

## 2018-11-27 ENCOUNTER — Ambulatory Visit (HOSPITAL_COMMUNITY): Payer: Medicare HMO | Admitting: Physical Therapy

## 2018-11-27 ENCOUNTER — Other Ambulatory Visit: Payer: Self-pay

## 2018-11-27 DIAGNOSIS — R600 Localized edema: Secondary | ICD-10-CM | POA: Diagnosis not present

## 2018-11-27 DIAGNOSIS — S91001A Unspecified open wound, right ankle, initial encounter: Secondary | ICD-10-CM

## 2018-11-27 DIAGNOSIS — L97319 Non-pressure chronic ulcer of right ankle with unspecified severity: Secondary | ICD-10-CM

## 2018-11-27 DIAGNOSIS — L03115 Cellulitis of right lower limb: Secondary | ICD-10-CM

## 2018-11-27 NOTE — Therapy (Signed)
[facility] [address] [place], [place], [address] Phone: [phone]   Fax:  [phone]  Wound Care Therapy  Patient Details  Name: Gary Ferguson MRN: [id number] Date of Birth: 10-30-1955 Referring Provider (PT): Marlowe Kays, MD   Encounter Date: 11/27/2018   Progress Note Reporting Period 10/26/18 to 11/27/18  See note below for Objective Data and Assessment of Progress/Goals.       PT End of Session - 11/27/18 1700    Visit Number  10    Number of Visits  12    Date for PT Re-Evaluation  12/07/18    Authorization Type  [employer] Medicare HMO ($35 copay, no visit limit)    Authorization Time Period  10/26/18-12/07/18    Authorization - Visit Number  10    Authorization - Number of Visits  10    PT Start Time  1612    PT Stop Time  1650    PT Time Calculation (min)  38 min    Activity Tolerance  Patient tolerated treatment well    Behavior During Therapy  WFL for tasks assessed/performed       Past Medical History:  Diagnosis Date  . Arthritis   . Chronic back pain   . Complication of anesthesia    Patient said that after surgery his throat closed up in 2011  . GERD (gastroesophageal reflux disease)   . History of DVT of lower extremity   . Hx of blood clots 2010  . Kidney stones   . Macular atrophy, retinal    bilateral  . Torn rotator cuff    right shoulder    Past Surgical History:  Procedure Laterality Date  . BACK SURGERY  1997  . BACK SURGERY  04/2009   3 separate surgies for infection   . COLONOSCOPY  07/21/2011   Procedure: COLONOSCOPY;  Surgeon: [name], MD;  [facility];  Service: Endoscopy;  Laterality: N/A;  12:45 PM  . TONSILLECTOMY    . TOTAL HIP ARTHROPLASTY Left 11/10/2015  . TOTAL HIP ARTHROPLASTY Left 11/10/2015   Procedure: TOTAL LEFT HIP ARTHROPLASTY ANTERIOR APPROACH;  Surgeon: [name], MD;  [facility];  Service: Orthopedics;  Laterality: Left;    There  were no vitals filed for this visit.              Wound Therapy - 11/27/18 1656    Subjective  No complaints today, doing well but leg is tender at times     Patient and Family Stated Goals  wound to heal    Date of Onset  06/27/18   approximate   Prior Treatments  wound care in our office, d/c on 05/16/18    Pain Scale  0-10    Pain Score  0-No pain    Evaluation and Treatment Procedures Explained to Patient/Family  Yes    Evaluation and Treatment Procedures  agreed to    Wound Properties Date First Assessed: 10/26/18 Time First Assessed: 1615 Wound Type: Venous stasis ulcer;Other (Comment) , cellultis  Location: Leg Location Orientation: Right;Lower;Circumferential Wound Description (Comments): redness of skin around entire circumference of Rt lower Leg Present on Admission: Yes   Dressing Type  Compression wrap;Impregnated gauze (bismuth)    Dressing Changed  Changed    Dressing Status  Old drainage    Dressing Change Frequency  PRN    Site / Wound Assessment  Red    % Wound base Red or Granulating  100%    Peri-wound Assessment  Erythema (non-blanchable)    Drainage Amount  Scant    Drainage Description  Serous    Treatment  Cleansed;Debridement (Selective);Packing (Impregnated strip);Pressure applied;Tape changed    Selective Debridement - Location  R lower leg     Selective Debridement - Tools Used  Forceps    Selective Debridement - Tissue Removed  dead skin and slough     Wound Therapy - Clinical Statement  Gary Ferguson arrives very pleasant today, does report some ongoing tenderness in his LE however otherwise doing well. Upon unwrapping LE, noticed some areas with increased redness but no increased drainage or temperature today. Took measurements of wound area and continued to dress with xeroform, vasoline, and profore lite for compression. Recommend continuing with skilled wound care services to promote full wound healing and prevention of infection.    Wound Therapy -  Functional Problem List  difficulty walking and wearing work boots     Factors Delaying/Impairing Wound Healing  Vascular compromise    Hydrotherapy Plan  Debridement;Dressing change;Patient/family education    Wound Therapy - Frequency  2X / week    Wound Therapy - Current Recommendations  PT    Wound Plan  continue xeroform/profore lite. No significant drainage noted medial aspect.     Dressing   xeroform, vasoline, profore lite               PT Education - 11/27/18 1700    Education Details  cut off wrap if toes lose feeling or change color    Person(s) Educated  Patient    Methods  Explanation    Comprehension  Verbalized understanding       PT Short Term Goals - 11/27/18 1703      PT SHORT TERM GOAL #1   Title  Patient's wound will have reduced in size by 50% or greater to indicate wound healing is progressing through proliferative phase.    Time  3    Period  Weeks    Status  On-going        PT Long Term Goals - 11/27/18 1703      PT LONG TERM GOAL #1   Title  Patient will verbalize understanding of importance of life long use of compression stockings to prevent ulcer recurence and will demosntrate proper donning/doffing of garment independently.    Time  6    Period  Weeks    Status  Achieved      PT LONG TERM GOAL #2   Title  Patient with have no signs of localized infection and skin with be fully epithelialized and healed.    Time  6    Period  Weeks    Status  On-going              Patient will benefit from skilled therapeutic intervention in order to improve the following deficits and impairments:     Visit Diagnosis: Ankle ulcer, right, with unspecified severity (HCC)  Cellulitis of right lower leg  Localized edema  Open wound of right ankle, initial encounter     Problem List Patient Active Problem List   Diagnosis Date Noted  . Primary osteoarthritis of left hip 11/10/2015    Deniece Ree PT, DPT, CBIS  Supplemental  Physical Therapist [facility]    Pager [phone] Acute Rehab Office [place] [address] [place], [place], [address] Phone: [phone]   Fax:  [phone]  Name:  Gary Ferguson MRN: [id number] Date of Birth: 08/24/1955

## 2018-11-30 ENCOUNTER — Telehealth (HOSPITAL_COMMUNITY): Payer: Self-pay

## 2018-11-30 ENCOUNTER — Ambulatory Visit (HOSPITAL_COMMUNITY): Payer: Medicare HMO

## 2018-11-30 ENCOUNTER — Other Ambulatory Visit: Payer: Self-pay

## 2018-11-30 ENCOUNTER — Encounter (HOSPITAL_COMMUNITY): Payer: Self-pay

## 2018-11-30 DIAGNOSIS — L03115 Cellulitis of right lower limb: Secondary | ICD-10-CM | POA: Diagnosis not present

## 2018-11-30 DIAGNOSIS — L97319 Non-pressure chronic ulcer of right ankle with unspecified severity: Secondary | ICD-10-CM

## 2018-11-30 DIAGNOSIS — R600 Localized edema: Secondary | ICD-10-CM | POA: Diagnosis not present

## 2018-11-30 DIAGNOSIS — S91001A Unspecified open wound, right ankle, initial encounter: Secondary | ICD-10-CM

## 2018-11-30 NOTE — Telephone Encounter (Signed)
Contacted MD concerning a wound culture.  Faxed referral for [phone].  [address] [place]; CBIS [phone]

## 2018-11-30 NOTE — Therapy (Signed)
[facility] [address] [place], [place], [address] Phone: [phone]   Fax:  [phone]  Wound Care Therapy  Patient Details  Name: Gary Ferguson MRN: [id number] Date of Birth: 1955/06/20 Referring Provider (PT): Marlowe Kays, MD   Encounter Date: 11/30/2018  PT End of Session - 11/30/18 1755    Visit Number  11    Number of Visits  12    Date for PT Re-Evaluation  12/07/18    Authorization Type  [employer] Medicare HMO ($35 copay, no visit limit)    Authorization Time Period  10/26/18-12/07/18    Authorization - Visit Number  11    Authorization - Number of Visits  20    PT Start Time  1710    PT Stop Time  1748    PT Time Calculation (min)  38 min    Activity Tolerance  Patient tolerated treatment well    Behavior During Therapy  [facility] for tasks assessed/performed       Past Medical History:  Diagnosis Date  . Arthritis   . Chronic back pain   . Complication of anesthesia    Patient said that after surgery his throat closed up in 2011  . GERD (gastroesophageal reflux disease)   . History of DVT of lower extremity   . Hx of blood clots 2010  . Kidney stones   . Macular atrophy, retinal    bilateral  . Torn rotator cuff    right shoulder    Past Surgical History:  Procedure Laterality Date  . BACK SURGERY  1997  . BACK SURGERY  04/2009   3 separate surgies for infection   . COLONOSCOPY  07/21/2011   Procedure: COLONOSCOPY;  Surgeon: [name], MD;  [facility];  Service: Endoscopy;  Laterality: N/A;  12:45 PM  . TONSILLECTOMY    . TOTAL HIP ARTHROPLASTY Left 11/10/2015  . TOTAL HIP ARTHROPLASTY Left 11/10/2015   Procedure: TOTAL LEFT HIP ARTHROPLASTY ANTERIOR APPROACH;  Surgeon: [name], MD;  [facility]: [facility];  Service: Orthopedics;  Laterality: Left;    There were no vitals filed for this visit.   Subjective Assessment - 11/30/18 1749    Subjective  Pt arrived with dressings intact, no  reports of pain currently.  Reports some increased tenderness on medial aspect and heel following last session                Wound Therapy - 11/30/18 1749    Subjective  Pt arrived with dressings intact, no reports of pain currently.  Reports some increased tenderness on medial aspect and heel following last session    Patient and Family Stated Goals  wound to heal    Date of Onset  06/27/18   approximate   Prior Treatments  wound care in our office, d/c on 05/16/18    Pain Scale  0-10    Pain Score  0-No pain    Wound Properties Date First Assessed: 10/26/18 Time First Assessed: 1615 Wound Type: Venous stasis ulcer;Other (Comment) , cellultis  Location: Leg Location Orientation: Right;Lower;Circumferential Wound Description (Comments): redness of skin around entire circumference of Rt lower Leg Present on Admission: Yes   Dressing Type  Compression wrap;Impregnated gauze (bismuth)    Dressing Changed  Changed    Dressing Status  Old drainage    Dressing Change Frequency  PRN    Site / Wound Assessment  Red    % Wound base Red or  Granulating  100%    Peri-wound Assessment  Erythema (non-blanchable)    Drainage Amount  Scant    Drainage Description  Serous    Treatment  Cleansed;Debridement (Selective)    Selective Debridement - Location  R lower leg     Selective Debridement - Tools Used  Forceps    Selective Debridement - Tissue Removed  dead skin and slough     Wound Therapy - Clinical Statement  Dressings have slid down with indentions on posterior calf.  Selective debridement for removal of slough and dry skin.  Noted drainage with white tent on medial aspect, not biofilm or dead skin.  Due to number of sessions, sent a referral to MD earlier today for wound culture to rule out infections, awaiting signed referral.  Continued wiht xeroform and profore lite with additional cotton/ABD added to assist with sliding down.  No reports of pain through session.      Wound Therapy -  Functional Problem List  difficulty walking and wearing work boots     Factors Delaying/Impairing Wound Healing  Vascular compromise    Hydrotherapy Plan  Debridement;Dressing change;Patient/family education    Wound Therapy - Frequency  2X / week    Wound Therapy - Current Recommendations  PT    Wound Plan  F/u wiht signed referral for culture and assure lab is open at pt.'s apt time.  Did state he would be willing to come in earlier if needed.  Continue wiht appropriate dressings and profore lite.  Transition to compression hose when ready.    Dressing   xeroform, vasoline, profore lite                 PT Short Term Goals - 11/27/18 1703      PT SHORT TERM GOAL #1   Title  Patient's wound will have reduced in size by 50% or greater to indicate wound healing is progressing through proliferative phase.    Time  3    Period  Weeks    Status  On-going        PT Long Term Goals - 11/27/18 1703      PT LONG TERM GOAL #1   Title  Patient will verbalize understanding of importance of life long use of compression stockings to prevent ulcer recurence and will demosntrate proper donning/doffing of garment independently.    Time  6    Period  Weeks    Status  Achieved      PT LONG TERM GOAL #2   Title  Patient with have no signs of localized infection and skin with be fully epithelialized and healed.    Time  6    Period  Weeks    Status  On-going              Patient will benefit from skilled therapeutic intervention in order to improve the following deficits and impairments:     Visit Diagnosis: Cellulitis of right lower leg  Open wound of right ankle, initial encounter  Localized edema  Ankle ulcer, right, with unspecified severity [facility])     Problem List Patient Active Problem List   Diagnosis Date Noted  . Primary osteoarthritis of left hip 11/10/2015   [name], LPTA; [facility] [phone]  [name] 11/30/2018, 6:00 PM  [place], [place], [address] Phone: [phone]   Fax:  [phone]  Name: Gary Ferguson MRN: [id number] Date of Birth: 05-07-1955

## 2018-12-04 ENCOUNTER — Other Ambulatory Visit: Payer: Self-pay

## 2018-12-04 ENCOUNTER — Encounter (HOSPITAL_COMMUNITY): Payer: Self-pay

## 2018-12-04 ENCOUNTER — Ambulatory Visit (HOSPITAL_COMMUNITY): Payer: Medicare HMO

## 2018-12-04 DIAGNOSIS — L03115 Cellulitis of right lower limb: Secondary | ICD-10-CM | POA: Diagnosis not present

## 2018-12-04 DIAGNOSIS — L97319 Non-pressure chronic ulcer of right ankle with unspecified severity: Secondary | ICD-10-CM | POA: Diagnosis not present

## 2018-12-04 DIAGNOSIS — R600 Localized edema: Secondary | ICD-10-CM

## 2018-12-04 DIAGNOSIS — S91001A Unspecified open wound, right ankle, initial encounter: Secondary | ICD-10-CM | POA: Diagnosis not present

## 2018-12-04 NOTE — Therapy (Signed)
[place], [place], [address] Phone: [phone]   Fax:  [phone]  Wound Care Therapy  Patient Details  Name: Gary Ferguson MRN: [id number] Date of Birth: October 18, 1955 Referring Provider (PT): Jill Alexanders, MD   Encounter Date: 12/04/2018  PT End of Session - 12/04/18 1828    Visit Number  12    Number of Visits  20    Date for PT Re-Evaluation  01/01/19    Authorization Type  [employer] Medicare HMO ($35 copay, no visit limit)    Authorization Time Period  10/26/18-12/07/18    Authorization - Visit Number  12    Authorization - Number of Visits  20    PT Start Time  1632    PT Stop Time  0626    PT Time Calculation (min)  43 min    Activity Tolerance  Patient tolerated treatment well    Behavior During Therapy  [facility] for tasks assessed/performed       Past Medical History:  Diagnosis Date  . Arthritis   . Chronic back pain   . Complication of anesthesia    Patient said that after surgery his throat closed up in 2011  . GERD (gastroesophageal reflux disease)   . History of DVT of lower extremity   . Hx of blood clots 2010  . Kidney stones   . Macular atrophy, retinal    bilateral  . Torn rotator cuff    right shoulder    Past Surgical History:  Procedure Laterality Date  . BACK SURGERY  1997  . BACK SURGERY  04/2009   3 separate surgies for infection   . COLONOSCOPY  07/21/2011   Procedure: COLONOSCOPY;  Surgeon: Daneil Dolin, MD;  [facility];  Service: Endoscopy;  Laterality: N/A;  12:45 PM  . TONSILLECTOMY    . TOTAL HIP ARTHROPLASTY Left 11/10/2015  . TOTAL HIP ARTHROPLASTY Left 11/10/2015   Procedure: TOTAL LEFT HIP ARTHROPLASTY ANTERIOR APPROACH;  Surgeon: Rod Can, MD;  Location: [place];  Service: Orthopedics;  Laterality: Left;    There were no vitals filed for this visit.     Wound Therapy - 12/04/18 1750    Subjective  Patient arrives with dressing intact. He  reports some discomfort but states it is negligable and did not rate.     Patient and Family Stated Goals  wound to heal    Date of Onset  06/27/18   approximate   Prior Treatments  wound care in our office, d/c on 05/16/18    Pain Scale  0-10    Pain Score  0-No pain    Evaluation and Treatment Procedures Explained to Patient/Family  Yes    Evaluation and Treatment Procedures  agreed to    Wound Properties Date First Assessed: 10/26/18 Time First Assessed: 9485 Wound Type: Venous stasis ulcer;Other (Comment) , cellultis  Location: Leg Location Orientation: Right;Lower;Circumferential Wound Description (Comments): redness of skin around entire circumference of Rt lower Leg Present on Admission: Yes   Dressing Type  Compression wrap;Impregnated gauze (bismuth)    Dressing Changed  Changed    Dressing Status  Old drainage    Dressing Change Frequency  PRN    Site / Wound Assessment  Red    % Wound base Red or Granulating  100%    Peri-wound Assessment  Erythema (non-blanchable)    Wound Length (cm)  19 cm   19.4   Wound Width (cm)  21.2 cm   26   Wound Depth (cm)  0 cm   0   Wound Volume (cm^3)  0 cm^3    Wound Surface Area (cm^2)  402.8 cm^2    Drainage Amount  Scant    Drainage Description  Serous    Treatment  Cleansed;Debridement (Selective)    Selective Debridement - Location  Rt lower leg     Selective Debridement - Tools Used  Forceps    Selective Debridement - Tissue Removed  dead skin and slough     Wound Therapy - Clinical Statement  Pt's dressings remained in place and did not slide down. Continued with cleansing and debridement of dried exudate and flaking skin. Culture referral did not come back from MD yet. Patient continues to have erythema present around lower leg with no depth. He has had good results with an anti-fungal cream for red patches on his leg previously and this was applied to the red area of his lower leg today. Educated patient on how to cleanse and dress his  leg with cream and soft gauze wrap, 2x daily. Applied alginate to medial ankle where small spots of serosanguinous drainage was noted. Pt attempted to apply new compression stocking however stated they were far too tight and did not apply the stocking. He was educated on importance of compression and is planning to purchase some over the counter that are a lower compression. Next session will assess changes in edema and response to antifungal cream.     Wound Therapy - Functional Problem List  difficulty walking and wearing work boots     Factors Delaying/Impairing Wound Healing  Vascular compromise    Hydrotherapy Plan  Debridement;Dressing change;Patient/family education    Wound Therapy - Frequency  2X / week    Wound Therapy - Current Recommendations  PT    Wound Plan  F/u with signed referral for culture and assure lab is open at pt.'s apt time.  Did state he would be willing to come in earlier if needed.  Assess response to antifungal cream and restart compression if needed.    Dressing   anti-fungal cream (prescribed by MD), gauze wrap, coban         PT Education - 12/04/18 1831    Education Details  Educated on dressign change iwth antifungal cream and how to cleanse skin gently in shower with soapy water. Educated on how to apply alginate and wrap leg with gauze and coban.    Person(s) Educated  Patient    Methods  Explanation;Demonstration    Comprehension  Verbalized understanding       PT Short Term Goals - 11/27/18 1703      PT SHORT TERM GOAL #1   Title  Patient's wound will have reduced in size by 50% or greater to indicate wound healing is progressing through proliferative phase.    Time  3    Period  Weeks    Status  On-going        PT Long Term Goals - 11/27/18 1703      PT LONG TERM GOAL #1   Title  Patient will verbalize understanding of importance of life long use of compression stockings to prevent ulcer recurence and will demosntrate proper donning/doffing of  garment independently.    Time  6    Period  Weeks    Status  Achieved      PT LONG TERM GOAL #2   Title  Patient with have no signs of  localized infection and skin with be fully epithelialized and healed.    Time  6    Period  Weeks    Status  On-going         Plan - 12/04/18 1830    Clinical Impression Statement  see above    Personal Factors and Comorbidities  Age;Sex    Examination-Activity Limitations  Bathing;Dressing;Other   working on farm   Stability/Clinical Decision Making  Evolving/Moderate complexity    Rehab Potential  Good    PT Frequency  2x / week    PT Duration  6 weeks    PT Treatment/Interventions  Therapeutic exercise;Compression bandaging;Manual techniques;Patient/family education;Taping;Other (comment)   skilled wound care techniques and low level laser   PT Next Visit Plan  see above    Consulted and Agree with Plan of Care  Patient       Patient will benefit from skilled therapeutic intervention in order to improve the following deficits and impairments:  Pain, Decreased skin integrity, Increased edema  Visit Diagnosis: Cellulitis of right lower leg  Localized edema  Ankle ulcer, right, with unspecified severity [facility])     Problem List Patient Active Problem List   Diagnosis Date Noted  . Primary osteoarthritis of left hip 11/10/2015    Valentino Saxon, PT, DPT, [facility] Physical Therapist with [facility]  12/04/2018 6:32 PM    [facility] [address] [place], [place], [address] Phone: [phone]   Fax:  [phone]  Name: Gary Ferguson MRN: [id number] Date of Birth: 1955-08-30

## 2018-12-07 ENCOUNTER — Ambulatory Visit (HOSPITAL_COMMUNITY): Payer: Medicare HMO | Attending: Internal Medicine | Admitting: Physical Therapy

## 2018-12-07 ENCOUNTER — Other Ambulatory Visit: Payer: Self-pay

## 2018-12-07 ENCOUNTER — Telehealth (HOSPITAL_COMMUNITY): Payer: Self-pay

## 2018-12-07 DIAGNOSIS — L03115 Cellulitis of right lower limb: Secondary | ICD-10-CM | POA: Diagnosis not present

## 2018-12-07 DIAGNOSIS — R600 Localized edema: Secondary | ICD-10-CM | POA: Insufficient documentation

## 2018-12-07 NOTE — Telephone Encounter (Signed)
Left message concerning referral for wound culture.  Left contact number and fax number on message.    [address] [place]; [facility] [phone]

## 2018-12-07 NOTE — Therapy (Signed)
[place] [facility] [address] [place], [place], [address] Phone: [phone]   Fax:  [phone]  Wound Care Therapy  Patient Details  Name: Gary Ferguson MRN: [id number] Date of Birth: [birth year] Referring Provider (PT): Marlowe Kays, MD   Encounter Date: 12/07/2018    Past Medical History:  Diagnosis Date  . Arthritis   . Chronic back pain   . Complication of anesthesia    Patient said that after surgery his throat closed up in 2011  . GERD (gastroesophageal reflux disease)   . History of DVT of lower extremity   . Hx of blood clots 2010  . Kidney stones   . Macular atrophy, retinal    bilateral  . Torn rotator cuff    right shoulder    Past Surgical History:  Procedure Laterality Date  . BACK SURGERY  1997  . BACK SURGERY  04/2009   3 separate surgies for infection   . COLONOSCOPY  07/21/2011   Procedure: COLONOSCOPY;  Surgeon: [name], MD;  [facility];  Service: Endoscopy;  Laterality: N/A;  12:45 PM  . TONSILLECTOMY    . TOTAL HIP ARTHROPLASTY Left 11/10/2015  . TOTAL HIP ARTHROPLASTY Left 11/10/2015   Procedure: TOTAL LEFT HIP ARTHROPLASTY ANTERIOR APPROACH;  Surgeon: [name], MD;  [facility];  Service: Orthopedics;  Laterality: Left;    There were no vitals filed for this visit.              Wound Therapy - 12/07/18 1606    Subjective  pt arrives with new compression sock donned on Rt LE and dressing covering wound.  No issues and able to get stocking on okay.    Patient and Family Stated Goals  wound to heal    Date of Onset  06/27/18   approximate   Prior Treatments  wound care in our office, d/c on 05/16/18    Pain Scale  0-10    Pain Score  0-No pain    Evaluation and Treatment Procedures Explained to Patient/Family  Yes    Evaluation and Treatment Procedures  agreed to    Wound Properties Date First Assessed: 10/26/18 Time First Assessed: 1615 Wound Type: Venous  stasis ulcer;Other (Comment) , cellultis  Location: Leg Location Orientation: Right;Lower;Circumferential Wound Description (Comments): redness of skin around entire circumference of Rt lower Leg Present on Admission: Yes   Dressing Type  Compression wrap;Impregnated gauze (bismuth)    Dressing Changed  Changed    Dressing Status  Old drainage    Dressing Change Frequency  PRN    Site / Wound Assessment  Red    % Wound base Red or Granulating  100%    Peri-wound Assessment  Erythema (non-blanchable)    Drainage Amount  None    Drainage Description  --    Treatment  Cleansed;Debridement (Selective)    Selective Debridement - Location  Rt lower leg     Selective Debridement - Tools Used  Forceps    Selective Debridement - Tissue Removed  dead skin and slough     Wound Therapy - Clinical Statement  Overall improvement with reduced margins of redness, no longer drainage and less scaly tissue. Marked borders with skin safe marker.  did debride around the area and cleansed well.  Applied antifungal to affected area and lotion to foot/toes.  Kerlix and coban used along with #5 netting to secure.  Cotton used to built up around ankle as well.  Did not receive signed order for wound culture.      Wound Therapy - Functional Problem List  difficulty walking and wearing work boots     Factors Delaying/Impairing Wound Healing  Vascular compromise    Hydrotherapy Plan  Debridement;Dressing change;Patient/family education    Wound Therapy - Frequency  2X / week    Wound Therapy - Current Recommendations  PT    Wound Plan  F/u with signed referral for culture and assure lab is open at pt.'s apt time, if therapist feels he needs it..  Did state he would be willing to come in earlier if needed.  Assess response to antifungal cream and restart compression if needed.    Dressing   anti-fungal cream (prescribed by MD), gauze wrap, coban                PT Short Term Goals - 11/27/18 1703      PT SHORT  TERM GOAL #1   Title  Patient's wound will have reduced in size by 50% or greater to indicate wound healing is progressing through proliferative phase.    Time  3    Period  Weeks    Status  On-going        PT Long Term Goals - 11/27/18 1703      PT LONG TERM GOAL #1   Title  Patient will verbalize understanding of importance of life long use of compression stockings to prevent ulcer recurence and will demosntrate proper donning/doffing of garment independently.    Time  6    Period  Weeks    Status  Achieved      PT LONG TERM GOAL #2   Title  Patient with have no signs of localized infection and skin with be fully epithelialized and healed.    Time  6    Period  Weeks    Status  On-going              Patient will benefit from skilled therapeutic intervention in order to improve the following deficits and impairments:     Visit Diagnosis: Cellulitis of right lower leg  Localized edema     Problem List Patient Active Problem List   Diagnosis Date Noted  . Primary osteoarthritis of left hip 11/10/2015   Teena Irani, PTA/CLT [phone]  Teena Irani 12/07/2018, 4:16 PM  [place], [place], [address] Phone: [phone]   Fax:  [phone]  Name: Gary Ferguson MRN: [id number] Date of Birth: 05-Jun-1955

## 2018-12-12 ENCOUNTER — Ambulatory Visit (HOSPITAL_COMMUNITY): Payer: Medicare HMO

## 2018-12-12 ENCOUNTER — Encounter (HOSPITAL_COMMUNITY): Payer: Self-pay

## 2018-12-12 ENCOUNTER — Other Ambulatory Visit: Payer: Self-pay

## 2018-12-12 DIAGNOSIS — R600 Localized edema: Secondary | ICD-10-CM | POA: Diagnosis not present

## 2018-12-12 DIAGNOSIS — L03115 Cellulitis of right lower limb: Secondary | ICD-10-CM

## 2018-12-12 NOTE — Therapy (Signed)
[place], [place], [address] Phone: [phone]   Fax:  [phone]  Wound Care Therapy and Discharge Summary  Patient Details  Name: Gary Ferguson MRN: [id number] Date of Birth: 1955/10/02 Referring Provider (PT): Jill Alexanders, MD   Encounter Date: 12/12/2018   PHYSICAL THERAPY DISCHARGE SUMMARY  Visits from Start of Care: 13  Current functional level related to goals / functional outcomes: Patient presents today with no drainage and significantly decreased erythema compared to previous visits. Anti-fungal cream being applied 1x/day at night and patient reports wearing compression stocking during the day. No open wounds present or other signs of infection at this time. Patient has demonstrated independence with remaining skin care and will be discharged to self-care at this time.       Remaining deficits: See below   Education / Equipment: See below  Plan: Patient agrees to discharge.  Patient goals were partially met. Patient is being discharged due to meeting the stated rehab goals.  ?????       PT End of Session - 12/12/18 1820    Visit Number  13    Number of Visits  20    Date for PT Re-Evaluation  01/01/19    Authorization Type  [employer] HMO ($35 copay, no visit limit)    Authorization Time Period  10/26/18-12/07/18; 12/04/18-01/01/19    Authorization - Visit Number  12    Authorization - Number of Visits  20    PT Start Time  3300    PT Stop Time  1800    PT Time Calculation (min)  15 min    Activity Tolerance  Patient tolerated treatment well    Behavior During Therapy  WFL for tasks assessed/performed       Past Medical History:  Diagnosis Date  . Arthritis   . Chronic back pain   . Complication of anesthesia    Patient said that after surgery his throat closed up in 2011  . GERD (gastroesophageal reflux disease)   . History of DVT of lower extremity   . Hx of blood clots  2010  . Kidney stones   . Macular atrophy, retinal    bilateral  . Torn rotator cuff    right shoulder    Past Surgical History:  Procedure Laterality Date  . BACK SURGERY  1997  . BACK SURGERY  04/2009   3 separate surgies for infection   . COLONOSCOPY  07/21/2011   Procedure: COLONOSCOPY;  Surgeon: Daneil Dolin, MD;  Location: [facility];  Service: Endoscopy;  Laterality: N/A;  12:45 PM  . TONSILLECTOMY    . TOTAL HIP ARTHROPLASTY Left 11/10/2015  . TOTAL HIP ARTHROPLASTY Left 11/10/2015   Procedure: TOTAL LEFT HIP ARTHROPLASTY ANTERIOR APPROACH;  Surgeon: Rod Can, MD;  Location: [facility];  Service: Orthopedics;  Laterality: Left;    There were no vitals filed for this visit.   Subjective Assessment - 12/12/18 1818    Subjective  Pt arrived wtih compression stocking on right LE, no dressing. States he feels the leg has gotten much better. Reports that he has been putting the fungal cream on at night and wearing the compression stocking during the day. States it has not weeped in a week and that he would like today to be his last treatment session.    Currently in Pain?  No/denies    Pain Score  0-No pain  Wound Therapy - 12/12/18 1824    Subjective  Pt arrived wtih compression stocking on right LE, no dressing. States he feels the leg has gotten much better. Reports that he has been putting the fungal cream on at night and wearing the compression stocking during the day. States it has not weeped in a week and that he would like today to be his last treatment session.    Patient and Family Stated Goals  wound to heal    Date of Onset  06/27/18   approximate   Prior Treatments  wound care in our office, d/c on 05/16/18    Pain Score  0-No pain    Evaluation and Treatment Procedures Explained to Patient/Family  Yes    Evaluation and Treatment Procedures  agreed to    Wound Properties Date First Assessed: 10/26/18 Time First Assessed: 7408 Wound Type: Venous stasis  ulcer;Other (Comment) , cellultis  Location: Leg Location Orientation: Right;Lower;Circumferential Wound Description (Comments): redness of skin around entire circumference of Rt lower Leg Present on Admission: Yes Final Assessment Date: 12/12/18 Final Assessment Time: 1800   Dressing Type  Compression wrap   compression stocking   Dressing Status  None    Dressing Change Frequency  PRN    Site / Wound Assessment  Red    % Wound base Red or Granulating  100%    Peri-wound Assessment  Erythema (non-blanchable)    Drainage Amount  None    Selective Debridement - Location  --    Selective Debridement - Tools Used  --    Selective Debridement - Tissue Removed  --    Wound Therapy - Clinical Statement  Patient presents today with no drainage and significantly decreased erythema compared to previous visits. Anti-fungal cream being applied 1x/day at night and patient reports wearing compression stocking during the day. No open wounds present or other signs of infection at this time. Patient has demonstrated independence with remaining skin care and will be discharged to self-care at this time.     Wound Therapy - Functional Problem List  difficulty walking and wearing work boots     Factors Delaying/Impairing Wound Healing  Vascular compromise    Hydrotherapy Plan  Debridement;Dressing change;Patient/family education    Wound Therapy - Frequency  2X / week    Wound Therapy - Current Recommendations  PT    Wound Plan  F/u with signed referral for culture and assure lab is open at pt.'s apt time, if therapist feels he needs it..  Did state he would be willing to come in earlier if needed.  Assess response to antifungal cream and restart compression if needed.    Dressing   anti-fungal cream (prescribed by MD), gauze wrap, coban         PT Education - 12/12/18 1819    Education Details  educated patient on continued use of compression sock during the day, continued skin hygiene and use of non-scented  lotion once leg has completely healed with the continued use of the fungal cream    Person(s) Educated  Patient    Methods  Explanation    Comprehension  Verbalized understanding       PT Short Term Goals - 12/12/18 1821      PT SHORT TERM GOAL #1   Title  Patient's wound will have reduced in size by 50% or greater to indicate wound healing is progressing through proliferative phase.    Time  3    Period  Weeks  Status  Achieved        PT Long Term Goals - 12/12/18 1822      PT LONG TERM GOAL #1   Title  Patient will verbalize understanding of importance of life long use of compression stockings to prevent ulcer recurence and will demosntrate proper donning/doffing of garment independently.    Time  6    Period  Weeks    Status  Achieved      PT LONG TERM GOAL #2   Title  Patient with have no signs of localized infection and skin with be fully epithelialized and healed.    Baseline  no signs of infection, however continued erythema, skin is epithelialized but no longer draining    Time  6    Period  Weeks    Status  Partially Met            Plan - 12/12/18 1821    Clinical Impression Statement  see above    Personal Factors and Comorbidities  Age;Sex    Examination-Activity Limitations  Bathing;Dressing;Other   working on farm   Stability/Clinical Decision Making  Evolving/Moderate complexity    Rehab Potential  Good    PT Frequency  2x / week    PT Duration  4 weeks   additional   PT Treatment/Interventions  Therapeutic exercise;Compression bandaging;Manual techniques;Patient/family education;Taping;Other (comment)   skilled wound care techniques and low level laser   PT Next Visit Plan  patient discharged from PT at this time. Reviewed self care items prior to discharge    Consulted and Agree with Plan of Care  Patient       Patient will benefit from skilled therapeutic intervention in order to improve the following deficits and impairments:  Pain,  Decreased skin integrity, Increased edema  Visit Diagnosis: Cellulitis of right lower leg  Localized edema     Problem List Patient Active Problem List   Diagnosis Date Noted  . Primary osteoarthritis of left hip 11/10/2015    6:32 PM, 12/12/18 Jerene Pitch, DPT Physical Therapy with [facility]  [phone] office   [place], [place], [address] Phone: [phone]   Fax:  [phone]  Name: Gary Ferguson MRN: [id number] Date of Birth: 1955/06/06

## 2018-12-14 ENCOUNTER — Ambulatory Visit (HOSPITAL_COMMUNITY): Payer: Medicare HMO

## 2019-01-22 ENCOUNTER — Other Ambulatory Visit: Payer: Self-pay | Admitting: Internal Medicine

## 2019-01-22 ENCOUNTER — Other Ambulatory Visit (HOSPITAL_COMMUNITY): Payer: Self-pay | Admitting: Internal Medicine

## 2019-01-22 DIAGNOSIS — R52 Pain, unspecified: Secondary | ICD-10-CM

## 2019-01-22 DIAGNOSIS — R609 Edema, unspecified: Secondary | ICD-10-CM

## 2019-01-31 DIAGNOSIS — H353124 Nonexudative age-related macular degeneration, left eye, advanced atrophic with subfoveal involvement: Secondary | ICD-10-CM | POA: Diagnosis not present

## 2019-01-31 DIAGNOSIS — H353113 Nonexudative age-related macular degeneration, right eye, advanced atrophic without subfoveal involvement: Secondary | ICD-10-CM | POA: Diagnosis not present

## 2019-01-31 DIAGNOSIS — H43812 Vitreous degeneration, left eye: Secondary | ICD-10-CM | POA: Diagnosis not present

## 2019-04-24 DIAGNOSIS — Z1389 Encounter for screening for other disorder: Secondary | ICD-10-CM | POA: Diagnosis not present

## 2019-04-24 DIAGNOSIS — M1991 Primary osteoarthritis, unspecified site: Secondary | ICD-10-CM | POA: Diagnosis not present

## 2019-04-24 DIAGNOSIS — K219 Gastro-esophageal reflux disease without esophagitis: Secondary | ICD-10-CM | POA: Diagnosis not present

## 2019-04-24 DIAGNOSIS — I158 Other secondary hypertension: Secondary | ICD-10-CM | POA: Diagnosis not present

## 2019-06-09 ENCOUNTER — Ambulatory Visit: Payer: Medicare Other | Attending: Internal Medicine

## 2019-06-09 DIAGNOSIS — Z23 Encounter for immunization: Secondary | ICD-10-CM | POA: Insufficient documentation

## 2019-06-09 NOTE — Progress Notes (Signed)
   Covid-19 Vaccination Clinic  Name:  RYLEI CODISPOTI    MRN: [id number] DOB: 1955-08-19  06/09/2019  Mr. Lamia was observed post Covid-19 immunization for 15 minutes without incident. He was provided with Vaccine Information Sheet and instruction to access the V-Safe system.   Mr. Morgan was instructed to call 911 with any severe reactions post vaccine: Marland Kitchen Difficulty breathing  . Swelling of face and throat  . A fast heartbeat  . A bad rash all over body  . Dizziness and weakness   Immunizations Administered    Name Date Dose VIS Date Route   Moderna COVID-19 Vaccine 06/09/2019  9:04 AM 0.5 mL 03/06/2019 Intramuscular   Manufacturer: Moderna   Lot: [id number]   NDC: 47998-721-58

## 2019-07-04 DIAGNOSIS — E039 Hypothyroidism, unspecified: Secondary | ICD-10-CM | POA: Diagnosis not present

## 2019-07-04 DIAGNOSIS — I872 Venous insufficiency (chronic) (peripheral): Secondary | ICD-10-CM | POA: Diagnosis not present

## 2019-07-04 DIAGNOSIS — K219 Gastro-esophageal reflux disease without esophagitis: Secondary | ICD-10-CM | POA: Diagnosis not present

## 2019-07-04 DIAGNOSIS — M8668 Other chronic osteomyelitis, other site: Secondary | ICD-10-CM | POA: Diagnosis not present

## 2019-07-04 DIAGNOSIS — E782 Mixed hyperlipidemia: Secondary | ICD-10-CM | POA: Diagnosis not present

## 2019-07-04 DIAGNOSIS — Z1389 Encounter for screening for other disorder: Secondary | ICD-10-CM | POA: Diagnosis not present

## 2019-07-04 DIAGNOSIS — G894 Chronic pain syndrome: Secondary | ICD-10-CM | POA: Diagnosis not present

## 2019-07-04 DIAGNOSIS — Z0001 Encounter for general adult medical examination with abnormal findings: Secondary | ICD-10-CM | POA: Diagnosis not present

## 2019-07-11 ENCOUNTER — Ambulatory Visit: Payer: Medicare Other | Attending: Internal Medicine

## 2019-07-11 DIAGNOSIS — Z23 Encounter for immunization: Secondary | ICD-10-CM

## 2019-07-11 NOTE — Progress Notes (Signed)
   Covid-19 Vaccination Clinic  Name:  Gary Ferguson    MRN: [id number] DOB: [birth year]  07/11/2019  [name] was observed post Covid-19 immunization for 15 minutes without incident. He was provided with Vaccine Information Sheet and instruction to access the V-Safe system.   [name] was instructed to call 911 with any severe reactions post vaccine: Marland Kitchen Difficulty breathing  . Swelling of face and throat  . A fast heartbeat  . A bad rash all over body  . Dizziness and weakness   Immunizations Administered    Name Date Dose VIS Date Route   Moderna COVID-19 Vaccine 07/11/2019  9:09 AM 0.5 mL 03/06/2019 Intramuscular   Manufacturer: Gala Murdoch   Lot: [id number]   NDC: 68403-353-31

## 2019-07-11 NOTE — Progress Notes (Signed)
   Covid-19 Vaccination Clinic  Name:  Gary Ferguson    MRN: [id number] DOB: 10/03/1955  07/11/2019  [name] was observed post Covid-19 immunization for 15 minutes without incident. He was provided with Vaccine Information Sheet and instruction to access the V-Safe system.   [name] was instructed to call 911 with any severe reactions post vaccine: . Difficulty breathing  . Swelling of face and throat  . A fast heartbeat  . A bad rash all over body  . Dizziness and weakness   Immunizations Administered    Name Date Dose VIS Date Route   Moderna COVID-19 Vaccine 07/11/2019  9:09 AM 0.5 mL 03/06/2019 Intramuscular   Manufacturer: Moderna   Lot: [id number]   NDC: 80777-273-99     

## 2019-12-04 DIAGNOSIS — K219 Gastro-esophageal reflux disease without esophagitis: Secondary | ICD-10-CM | POA: Diagnosis not present

## 2019-12-04 DIAGNOSIS — E7849 Other hyperlipidemia: Secondary | ICD-10-CM | POA: Diagnosis not present

## 2019-12-04 DIAGNOSIS — N1831 Chronic kidney disease, stage 3a: Secondary | ICD-10-CM | POA: Diagnosis not present

## 2019-12-04 DIAGNOSIS — M1991 Primary osteoarthritis, unspecified site: Secondary | ICD-10-CM | POA: Diagnosis not present

## 2020-02-02 DIAGNOSIS — M1991 Primary osteoarthritis, unspecified site: Secondary | ICD-10-CM | POA: Diagnosis not present

## 2020-02-02 DIAGNOSIS — K219 Gastro-esophageal reflux disease without esophagitis: Secondary | ICD-10-CM | POA: Diagnosis not present

## 2020-02-02 DIAGNOSIS — E7849 Other hyperlipidemia: Secondary | ICD-10-CM | POA: Diagnosis not present

## 2020-03-04 DIAGNOSIS — E7849 Other hyperlipidemia: Secondary | ICD-10-CM | POA: Diagnosis not present

## 2020-03-04 DIAGNOSIS — K219 Gastro-esophageal reflux disease without esophagitis: Secondary | ICD-10-CM | POA: Diagnosis not present

## 2020-03-04 DIAGNOSIS — M1991 Primary osteoarthritis, unspecified site: Secondary | ICD-10-CM | POA: Diagnosis not present

## 2020-07-02 DIAGNOSIS — E782 Mixed hyperlipidemia: Secondary | ICD-10-CM | POA: Diagnosis not present

## 2020-07-02 DIAGNOSIS — K219 Gastro-esophageal reflux disease without esophagitis: Secondary | ICD-10-CM | POA: Diagnosis not present

## 2020-07-08 DIAGNOSIS — Z0001 Encounter for general adult medical examination with abnormal findings: Secondary | ICD-10-CM | POA: Diagnosis not present

## 2020-07-08 DIAGNOSIS — Z1389 Encounter for screening for other disorder: Secondary | ICD-10-CM | POA: Diagnosis not present

## 2020-07-08 DIAGNOSIS — N1831 Chronic kidney disease, stage 3a: Secondary | ICD-10-CM | POA: Diagnosis not present

## 2020-07-08 DIAGNOSIS — E7849 Other hyperlipidemia: Secondary | ICD-10-CM | POA: Diagnosis not present

## 2020-07-08 DIAGNOSIS — E039 Hypothyroidism, unspecified: Secondary | ICD-10-CM | POA: Diagnosis not present

## 2020-07-22 DIAGNOSIS — H2513 Age-related nuclear cataract, bilateral: Secondary | ICD-10-CM | POA: Diagnosis not present

## 2020-07-22 DIAGNOSIS — H43813 Vitreous degeneration, bilateral: Secondary | ICD-10-CM | POA: Diagnosis not present

## 2020-10-02 DIAGNOSIS — E782 Mixed hyperlipidemia: Secondary | ICD-10-CM | POA: Diagnosis not present

## 2020-10-02 DIAGNOSIS — K219 Gastro-esophageal reflux disease without esophagitis: Secondary | ICD-10-CM | POA: Diagnosis not present

## 2020-11-04 DIAGNOSIS — M170 Bilateral primary osteoarthritis of knee: Secondary | ICD-10-CM | POA: Diagnosis not present

## 2020-11-04 DIAGNOSIS — Z96642 Presence of left artificial hip joint: Secondary | ICD-10-CM | POA: Diagnosis not present

## 2020-12-16 ENCOUNTER — Other Ambulatory Visit (HOSPITAL_COMMUNITY): Payer: Self-pay | Admitting: Orthopedic Surgery

## 2020-12-16 DIAGNOSIS — M79604 Pain in right leg: Secondary | ICD-10-CM

## 2020-12-16 DIAGNOSIS — M79662 Pain in left lower leg: Secondary | ICD-10-CM | POA: Diagnosis not present

## 2020-12-16 DIAGNOSIS — M79661 Pain in right lower leg: Secondary | ICD-10-CM

## 2020-12-16 DIAGNOSIS — M170 Bilateral primary osteoarthritis of knee: Secondary | ICD-10-CM | POA: Diagnosis not present

## 2020-12-16 DIAGNOSIS — M79605 Pain in left leg: Secondary | ICD-10-CM

## 2020-12-17 ENCOUNTER — Other Ambulatory Visit: Payer: Self-pay

## 2020-12-17 ENCOUNTER — Ambulatory Visit (HOSPITAL_COMMUNITY)
Admission: RE | Admit: 2020-12-17 | Discharge: 2020-12-17 | Disposition: A | Discharge status: Home / Self Care | Payer: Medicare Other | Source: Ambulatory Visit | Attending: Cardiovascular Disease | Admitting: Cardiovascular Disease

## 2020-12-17 DIAGNOSIS — M79604 Pain in right leg: Secondary | ICD-10-CM | POA: Insufficient documentation

## 2020-12-17 DIAGNOSIS — M79661 Pain in right lower leg: Secondary | ICD-10-CM | POA: Diagnosis not present

## 2020-12-17 DIAGNOSIS — M79662 Pain in left lower leg: Secondary | ICD-10-CM | POA: Diagnosis not present

## 2020-12-17 DIAGNOSIS — M79605 Pain in left leg: Secondary | ICD-10-CM | POA: Diagnosis not present

## 2020-12-23 ENCOUNTER — Ambulatory Visit: Payer: Self-pay | Admitting: Student

## 2021-01-02 DIAGNOSIS — K219 Gastro-esophageal reflux disease without esophagitis: Secondary | ICD-10-CM | POA: Diagnosis not present

## 2021-01-02 DIAGNOSIS — E782 Mixed hyperlipidemia: Secondary | ICD-10-CM | POA: Diagnosis not present

## 2021-02-03 ENCOUNTER — Ambulatory Visit: Payer: Self-pay | Admitting: Student

## 2021-02-03 NOTE — H&P (Signed)
TOTAL KNEE ADMISSION H&P  Patient is being admitted for right total knee arthroplasty.  Subjective:  Chief Complaint:right knee pain.  HPI: Gary Ferguson, 65 y.o. male, has a history of pain and functional disability in the right knee due to arthritis and has failed non-surgical conservative treatments for greater than 12 weeks to includeNSAID's and/or analgesics and activity modification.  Onset of symptoms was gradual, starting 2 years ago with gradually worsening course since that time. The patient noted no past surgery on the right knee(s).  Patient currently rates pain in the right knee(s) at 8 out of 10 with activity. Patient has worsening of pain with activity and weight bearing, pain that interferes with activities of daily living, and pain with passive range of motion.  Patient has evidence of subchondral cysts, subchondral sclerosis, and joint space narrowing by imaging studies. There is no active infection.  Patient Active Problem List   Diagnosis Date Noted   Primary osteoarthritis of left hip 11/10/2015   Past Medical History:  Diagnosis Date   Arthritis    Chronic back pain    Complication of anesthesia    Patient said that after surgery his throat closed up in 2011   GERD (gastroesophageal reflux disease)    History of DVT of lower extremity    Hx of blood clots 2010   Kidney stones    Macular atrophy, retinal    bilateral   Torn rotator cuff    right shoulder    Past Surgical History:  Procedure Laterality Date   BACK SURGERY  1997   BACK SURGERY  04/2009   3 separate surgies for infection    COLONOSCOPY  07/21/2011   Procedure: COLONOSCOPY;  Surgeon: Robert M Rourk, MD;  Location: [facility];  Service: Endoscopy;  Laterality: N/A;  12:45 PM   TONSILLECTOMY     TOTAL HIP ARTHROPLASTY Left 11/10/2015   TOTAL HIP ARTHROPLASTY Left 11/10/2015   Procedure: TOTAL LEFT HIP ARTHROPLASTY ANTERIOR APPROACH;  Surgeon: Brian Swinteck, MD;  Location: [facility];  Service:  Orthopedics;  Laterality: Left;    Current Outpatient Medications  Medication Sig Dispense Refill Last Dose   apixaban (ELIQUIS) 2.5 MG TABS tablet Take 1 tablet (2.5 mg total) by mouth every 12 (twelve) hours. (Patient not taking: Reported on 04/11/2018) 60 tablet 0    ciprofloxacin (CIPRO) 500 MG tablet Take 500 mg by mouth 2 (two) times daily.       clotrimazole-betamethasone (LOTRISONE) cream Apply 1 application topically 2 (two) times daily.      diphenhydramine-acetaminophen (TYLENOL PM) 25-500 MG TABS tablet Take 1 tablet by mouth at bedtime as needed (for sleep).       docusate sodium (COLACE) 100 MG capsule Take 1 capsule (100 mg total) by mouth 2 (two) times daily. (Patient not taking: Reported on 04/11/2018) 60 capsule 3    doxycycline (VIBRAMYCIN) 100 MG capsule Take 100 mg by mouth 2 (two) times daily.      fexofenadine (ALLEGRA) 180 MG tablet Take 180 mg by mouth daily as needed for allergies.       HYDROcodone-acetaminophen (NORCO) 7.5-325 MG tablet Take 1-2 tablets by mouth every 4 (four) hours as needed (breakthrough pain). (Patient not taking: Reported on 04/11/2018) 80 tablet 0    meloxicam (MOBIC) 7.5 MG tablet Take 7.5 mg by mouth daily.      Multiple Vitamin (MULTIVITAMIN) capsule Take 1 capsule by mouth daily.      naproxen (NAPROSYN) 375 MG tablet Take 375 mg   by mouth 2 (two) times daily with a meal.      naproxen sodium (ANAPROX) 220 MG tablet Take 375 mg by mouth daily. Reported on 09/24/2015      Omega-3 Fatty Acids (OMEGA 3 500 PO) Take 500 mg by mouth.      omeprazole (PRILOSEC) 20 MG capsule Take 20 mg by mouth every morning.       ondansetron (ZOFRAN) 4 MG tablet Take 1 tablet (4 mg total) by mouth every 6 (six) hours as needed for nausea. (Patient not taking: Reported on 04/11/2018) 20 tablet 0    senna (SENOKOT) 8.6 MG TABS tablet Take 2 tablets (17.2 mg total) by mouth at bedtime. (Patient not taking: Reported on 04/11/2018) 60 each 3    No current facility-administered  medications for this visit.   Allergies  Allergen Reactions   Penicillins Other (See Comments)    REACTION UNSPECIFIED  Has patient had a PCN reaction causing immediate rash, facial/tongue/throat swelling, SOB or lightheadedness with hypotension: unknown childhood allergy Has patient had a PCN reaction causing severe rash involving mucus membranes or skin necrosis: unsure Has patient had a PCN reaction that required hospitalization unsure Has patient had a PCN reaction occurring within the last 10 years: No If all of the above answers are "NO", then may proceed with Cephalosporin use.     Social History   Tobacco Use   Smoking status: Never   Smokeless tobacco: Never  Substance Use Topics   Alcohol use: No    Family History  Problem Relation Age of Onset   Liver disease Mother    Hypertension Father      Review of Systems  Musculoskeletal:  Positive for arthralgias.  All other systems reviewed and are negative.  Objective:  Physical Exam HENT:     Head: Normocephalic.  Eyes:     Pupils: Pupils are equal, round, and reactive to light.  Cardiovascular:     Rate and Rhythm: Normal rate.  Pulmonary:     Effort: Pulmonary effort is normal.  Abdominal:     Palpations: Abdomen is soft.  Genitourinary:    Comments: Deferred Musculoskeletal:        General: Tenderness present.     Cervical back: Normal range of motion.  Skin:    General: Skin is warm.  Neurological:     Mental Status: He is alert and oriented to person, place, and time.  Psychiatric:        Behavior: Behavior normal.    Vital signs in last 24 hours: @VSRANGES@  Labs:   Estimated body mass index is 27.16 kg/m as calculated from the following:   Height as of 11/10/15: 5' 10.5" (1.791 m).   Weight as of 11/10/15: 87.1 kg.   Imaging Review Plain radiographs demonstrate severe degenerative joint disease of the right knee(s). The bone quality appears to be adequate for age and reported activity  level.      Assessment/Plan:  End stage arthritis, right knee   The patient history, physical examination, clinical judgment of the provider and imaging studies are consistent with end stage degenerative joint disease of the right knee(s) and total knee arthroplasty is deemed medically necessary. The treatment options including medical management, injection therapy arthroscopy and arthroplasty were discussed at length. The risks and benefits of total knee arthroplasty were presented and reviewed. The risks due to aseptic loosening, infection, stiffness, patella tracking problems, thromboembolic complications and other imponderables were discussed. The patient acknowledged the explanation, agreed to proceed with   the plan and consent was signed. Patient is being admitted for inpatient treatment for surgery, pain control, PT, OT, prophylactic antibiotics, VTE prophylaxis, progressive ambulation and ADL's and discharge planning. The patient is planning to be discharged  home after overnight observation     Patient's anticipated LOS is less than 2 midnights, meeting these requirements: - Younger than 84 - Lives within 1 hour of care - Has a competent adult at home to recover with post-op recover - NO history of  - Chronic pain requiring opiods  - Diabetes  - Coronary Artery Disease  - Heart failure  - Heart attack  - Stroke  - DVT/VTE  - Cardiac arrhythmia  - Respiratory Failure/COPD  - Renal failure  - Anemia  - Advanced Liver disease

## 2021-02-03 NOTE — H&P (View-Only) (Signed)
TOTAL KNEE ADMISSION H&P  Patient is being admitted for right total knee arthroplasty.  Subjective:  Chief Complaint:right knee pain.  HPI: Gary Ferguson, 65 y.o. male, has a history of pain and functional disability in the right knee due to arthritis and has failed non-surgical conservative treatments for greater than 12 weeks to includeNSAID's and/or analgesics and activity modification.  Onset of symptoms was gradual, starting 2 years ago with gradually worsening course since that time. The patient noted no past surgery on the right knee(s).  Patient currently rates pain in the right knee(s) at 8 out of 10 with activity. Patient has worsening of pain with activity and weight bearing, pain that interferes with activities of daily living, and pain with passive range of motion.  Patient has evidence of subchondral cysts, subchondral sclerosis, and joint space narrowing by imaging studies. There is no active infection.  Patient Active Problem List   Diagnosis Date Noted   Primary osteoarthritis of left hip 11/10/2015   Past Medical History:  Diagnosis Date   Arthritis    Chronic back pain    Complication of anesthesia    Patient said that after surgery his throat closed up in 2011   GERD (gastroesophageal reflux disease)    History of DVT of lower extremity    Hx of blood clots 2010   Kidney stones    Macular atrophy, retinal    bilateral   Torn rotator cuff    right shoulder    Past Surgical History:  Procedure Laterality Date   BACK SURGERY  1997   BACK SURGERY  04/2009   3 separate surgies for infection    COLONOSCOPY  07/21/2011   Procedure: COLONOSCOPY;  Surgeon: [name], MD;  Location: [facility];  Service: Endoscopy;  Laterality: N/A;  12:45 PM   TONSILLECTOMY     TOTAL HIP ARTHROPLASTY Left 11/10/2015   TOTAL HIP ARTHROPLASTY Left 11/10/2015   Procedure: TOTAL LEFT HIP ARTHROPLASTY ANTERIOR APPROACH;  Surgeon: [name], MD;  Location: [facility] OR;  Service:  Orthopedics;  Laterality: Left;    Current Outpatient Medications  Medication Sig Dispense Refill Last Dose   apixaban (ELIQUIS) 2.5 MG TABS tablet Take 1 tablet (2.5 mg total) by mouth every 12 (twelve) hours. (Patient not taking: Reported on 04/11/2018) 60 tablet 0    ciprofloxacin (CIPRO) 500 MG tablet Take 500 mg by mouth 2 (two) times daily.       clotrimazole-betamethasone (LOTRISONE) cream Apply 1 application topically 2 (two) times daily.      diphenhydramine-acetaminophen (TYLENOL PM) 25-500 MG TABS tablet Take 1 tablet by mouth at bedtime as needed (for sleep).       docusate sodium (COLACE) 100 MG capsule Take 1 capsule (100 mg total) by mouth 2 (two) times daily. (Patient not taking: Reported on 04/11/2018) 60 capsule 3    doxycycline (VIBRAMYCIN) 100 MG capsule Take 100 mg by mouth 2 (two) times daily.      fexofenadine (ALLEGRA) 180 MG tablet Take 180 mg by mouth daily as needed for allergies.       HYDROcodone-acetaminophen (NORCO) 7.5-325 MG tablet Take 1-2 tablets by mouth every 4 (four) hours as needed (breakthrough pain). (Patient not taking: Reported on 04/11/2018) 80 tablet 0    meloxicam (MOBIC) 7.5 MG tablet Take 7.5 mg by mouth daily.      Multiple Vitamin (MULTIVITAMIN) capsule Take 1 capsule by mouth daily.      naproxen (NAPROSYN) 375 MG tablet Take 375 mg  by mouth 2 (two) times daily with a meal.      naproxen sodium (ANAPROX) 220 MG tablet Take 375 mg by mouth daily. Reported on 09/24/2015      Omega-3 Fatty Acids (OMEGA 3 500 PO) Take 500 mg by mouth.      omeprazole (PRILOSEC) 20 MG capsule Take 20 mg by mouth every morning.       ondansetron (ZOFRAN) 4 MG tablet Take 1 tablet (4 mg total) by mouth every 6 (six) hours as needed for nausea. (Patient not taking: Reported on 04/11/2018) 20 tablet 0    senna (SENOKOT) 8.6 MG TABS tablet Take 2 tablets (17.2 mg total) by mouth at bedtime. (Patient not taking: Reported on 04/11/2018) 60 each 3    No current facility-administered  medications for this visit.   Allergies  Allergen Reactions   Penicillins Other (See Comments)    REACTION UNSPECIFIED  Has patient had a PCN reaction causing immediate rash, facial/tongue/throat swelling, SOB or lightheadedness with hypotension: unknown childhood allergy Has patient had a PCN reaction causing severe rash involving mucus membranes or skin necrosis: unsure Has patient had a PCN reaction that required hospitalization unsure Has patient had a PCN reaction occurring within the last 10 years: No If all of the above answers are "NO", then may proceed with Cephalosporin use.     Social History   Tobacco Use   Smoking status: Never   Smokeless tobacco: Never  Substance Use Topics   Alcohol use: No    Family History  Problem Relation Age of Onset   Liver disease Mother    Hypertension Father      Review of Systems  Musculoskeletal:  Positive for arthralgias.  All other systems reviewed and are negative.  Objective:  Physical Exam HENT:     Head: Normocephalic.  Eyes:     Pupils: Pupils are equal, round, and reactive to light.  Cardiovascular:     Rate and Rhythm: Normal rate.  Pulmonary:     Effort: Pulmonary effort is normal.  Abdominal:     Palpations: Abdomen is soft.  Genitourinary:    Comments: Deferred Musculoskeletal:        General: Tenderness present.     Cervical back: Normal range of motion.  Skin:    General: Skin is warm.  Neurological:     Mental Status: He is alert and oriented to person, place, and time.  Psychiatric:        Behavior: Behavior normal.    Vital signs in last 24 hours: @VSRANGES @  Labs:   Estimated body mass index is 27.16 kg/m as calculated from the following:   Height as of 11/10/15: 5' 10.5" (1.791 m).   Weight as of 11/10/15: 87.1 kg.   Imaging Review Plain radiographs demonstrate severe degenerative joint disease of the right knee(s). The bone quality appears to be adequate for age and reported activity  level.      Assessment/Plan:  End stage arthritis, right knee   The patient history, physical examination, clinical judgment of the provider and imaging studies are consistent with end stage degenerative joint disease of the right knee(s) and total knee arthroplasty is deemed medically necessary. The treatment options including medical management, injection therapy arthroscopy and arthroplasty were discussed at length. The risks and benefits of total knee arthroplasty were presented and reviewed. The risks due to aseptic loosening, infection, stiffness, patella tracking problems, thromboembolic complications and other imponderables were discussed. The patient acknowledged the explanation, agreed to proceed with  the plan and consent was signed. Patient is being admitted for inpatient treatment for surgery, pain control, PT, OT, prophylactic antibiotics, VTE prophylaxis, progressive ambulation and ADL's and discharge planning. The patient is planning to be discharged  home after overnight observation     Patient's anticipated LOS is less than 2 midnights, meeting these requirements: - Younger than 84 - Lives within 1 hour of care - Has a competent adult at home to recover with post-op recover - NO history of  - Chronic pain requiring opiods  - Diabetes  - Coronary Artery Disease  - Heart failure  - Heart attack  - Stroke  - DVT/VTE  - Cardiac arrhythmia  - Respiratory Failure/COPD  - Renal failure  - Anemia  - Advanced Liver disease

## 2021-02-06 NOTE — Patient Instructions (Signed)
DUE TO COVID-19 ONLY ONE VISITOR IS ALLOWED TO COME WITH YOU AND STAY IN THE WAITING ROOM ONLY DURING PRE OP AND PROCEDURE.   **NO VISITORS ARE ALLOWED IN THE SHORT STAY AREA OR RECOVERY ROOM!!**  IF YOU WILL BE ADMITTED INTO THE HOSPITAL YOU ARE ALLOWED ONLY TWO SUPPORT PEOPLE DURING VISITATION HOURS ONLY (7 AM -8PM)   The support person(s) must pass our screening, gel in and out, and wear a mask at all times, including in the patient's room. Patients must also wear a mask when staff or their support person are in the room. Visitors GUEST BADGE MUST BE WORN VISIBLY  One adult visitor may remain with you overnight and MUST be in the room by 8 P.M.  No visitors under the age of 33. Any visitor under the age of 70 must be accompanied by an adult.    COVID SWAB TESTING MUST BE COMPLETED ON: 02/17/21                   8A - 3P  **MUST PRESENT COMPLETED FORM AT TESTING SITE**    [address] [place] (backside of the building) You are not required to quarantine, however you are required to wear a well-fitted mask when you are out and around people not in your household.  Hand Hygiene often Do NOT share personal items Notify your provider if you are in close contact with someone who has COVID or you develop fever 100.4 or greater, new onset of sneezing, cough, sore throat, shortness of breath or body aches.  [facility] Entr[facility] [address], [address], must go inside of the hospital, NOT A DRIVE THRU!  (Must self quarantine after testing. Follow instructions on handout.)       Your procedure is scheduled on: 02/19/21   Report to [facility] Main Entrance    Report to admitting at : 7:45 AM   Call this number if you have problems the morning of surgery [phone]   Do not eat food :After Midnight.   May have liquids until : 7:30 AM   day of surgery  CLEAR LIQUID DIET  Foods Allowed                                                                      Foods Excluded  Water, Black Coffee and tea, regular and decaf                             liquids that you cannot  Plain Jell-O in any flavor  (No red)                                           see through such as: Fruit ices (not with fruit pulp)                                     milk, soups, orange juice  Iced Popsicles (No red)                                    All solid food                                   Apple juices Sports drinks like Gatorade (No red) Lightly seasoned clear broth or consume(fat free) Sugar  Sample Menu Breakfast                                Lunch                                     Supper Cranberry juice                    Beef broth                            Chicken broth Jell-O                                     Grape juice                           Apple juice Coffee or tea                        Jell-O                                      Popsicle                                                Coffee or tea                        Coffee or tea      Complete one Ensure drink the morning of surgery at: 7:30 AM       the day of surgery.    The day of surgery:  Drink ONE (1) Pre-Surgery Clear Ensure or G2 by am the morning of surgery. Drink in one sitting. Do not sip.  This drink was given to you during your hospital  pre-op appointment visit. Nothing else to drink after completing the  Pre-Surgery Clear Ensure or G2.          If you have questions, please contact your surgeon's office.     Oral Hygiene is also important to reduce your risk of infection.                                    Remember - BRUSH YOUR TEETH THE MORNING OF SURGERY WITH YOUR REGULAR TOOTHPASTE   Do NOT smoke after Midnight   Take these medicines  the morning of surgery with A SIP OF WATER: Cipro,omeprazole.  DO NOT TAKE ANY ORAL DIABETIC MEDICATIONS DAY OF YOUR SURGERY                              You may not have any metal on your  body including hair pins, jewelry, and body piercing             Do not wear  lotions, powders, perfumes/cologne, or deodorant              Men may shave face and neck.   Do not bring valuables to the hospital. [facility] IS NOT             RESPONSIBLE   FOR VALUABLES.   Contacts, dentures or bridgework may not be worn into surgery.   Bring small overnight bag day of surgery.    Patients discharged on the day of surgery will not be allowed to drive home.   Special Instructions: Bring a copy of your healthcare power of attorney and living will documents         the day of surgery if you haven't scanned them before.              Please read over the following fact sheets you were given: IF YOU HAVE QUESTIONS ABOUT YOUR PRE-OP INSTRUCTIONS PLEASE CALL [phone]     [facility] - Preparing for Surgery Before surgery, you can play an important role.  Because skin is not sterile, your skin needs to be as free of germs as possible.  You can reduce the number of germs on your skin by washing with CHG (chlorahexidine gluconate) soap before surgery.  CHG is an antiseptic cleaner which kills germs and bonds with the skin to continue killing germs even after washing. Please DO NOT use if you have an allergy to CHG or antibacterial soaps.  If your skin becomes reddened/irritated stop using the CHG and inform your nurse when you arrive at Short Stay. Do not shave (including legs and underarms) for at least 48 hours prior to the first CHG shower.  You may shave your face/neck. Please follow these instructions carefully:  1.  Shower with CHG Soap the night before surgery and the  morning of Surgery.  2.  If you choose to wash your hair, wash your hair first as usual with your  normal  shampoo.  3.  After you shampoo, rinse your hair and body thoroughly to remove the  shampoo.                           4.  Use CHG as you would any other liquid soap.  You can apply chg directly  to the skin and wash                        Gently with a scrungie or clean washcloth.  5.  Apply the CHG Soap to your body ONLY FROM THE NECK DOWN.   Do not use on face/ open                           Wound or open sores. Avoid contact with eyes, ears mouth and genitals (private parts).  Wash face,  Genitals (private parts) with your normal soap.             6.  Wash thoroughly, paying special attention to the area where your surgery  will be performed.  7.  Thoroughly rinse your body with warm water from the neck down.  8.  DO NOT shower/wash with your normal soap after using and rinsing off  the CHG Soap.                9.  Pat yourself dry with a clean towel.            10.  Wear clean pajamas.            11.  Place clean sheets on your bed the night of your first shower and do not  sleep with pets. Day of Surgery : Do not apply any lotions/deodorants the morning of surgery.  Please wear clean clothes to the hospital/surgery center.  FAILURE TO FOLLOW THESE INSTRUCTIONS MAY RESULT IN THE CANCELLATION OF YOUR SURGERY PATIENT SIGNATURE_________________________________  NURSE SIGNATURE__________________________________  ________________________________________________________________________   Gary Ferguson  An incentive spirometer is a tool that can help keep your lungs clear and active. This tool measures how well you are filling your lungs with each breath. Taking long deep breaths may help reverse or decrease the chance of developing breathing (pulmonary) problems (especially infection) following: A long period of time when you are unable to move or be active. BEFORE THE PROCEDURE  If the spirometer includes an indicator to show your best effort, your nurse or respiratory therapist will set it to a desired goal. If possible, sit up straight or lean slightly forward. Try not to slouch. Hold the incentive spirometer in an upright position. INSTRUCTIONS FOR USE  Sit on the edge of  your bed if possible, or sit up as far as you can in bed or on a chair. Hold the incentive spirometer in an upright position. Breathe out normally. Place the mouthpiece in your mouth and seal your lips tightly around it. Breathe in slowly and as deeply as possible, raising the piston or the ball toward the top of the column. Hold your breath for 3-5 seconds or for as long as possible. Allow the piston or ball to fall to the bottom of the column. Remove the mouthpiece from your mouth and breathe out normally. Rest for a few seconds and repeat Steps 1 through 7 at least 10 times every 1-2 hours when you are awake. Take your time and take a few normal breaths between deep breaths. The spirometer may include an indicator to show your best effort. Use the indicator as a goal to work toward during each repetition. After each set of 10 deep breaths, practice coughing to be sure your lungs are clear. If you have an incision (the cut made at the time of surgery), support your incision when coughing by placing a pillow or rolled up towels firmly against it. Once you are able to get out of bed, walk around indoors and cough well. You may stop using the incentive spirometer when instructed by your caregiver.  RISKS AND COMPLICATIONS Take your time so you do not get dizzy or light-headed. If you are in pain, you may need to take or ask for pain medication before doing incentive spirometry. It is harder to take a deep breath if you are having pain. AFTER USE Rest and breathe slowly and easily. It can be helpful to keep track of  a log of your progress. Your caregiver can provide you with a simple table to help with this. If you are using the spirometer at home, follow these instructions: [place] IF:  You are having difficultly using the spirometer. You have trouble using the spirometer as often as instructed. Your pain medication is not giving enough relief while using the spirometer. You develop  fever of 100.5 F (38.1 C) or higher. SEEK IMMEDIATE MEDICAL CARE IF:  You cough up bloody sputum that had not been present before. You develop fever of 102 F (38.9 C) or greater. You develop worsening pain at or near the incision site. MAKE SURE YOU:  Understand these instructions. Will watch your condition. Will get help right away if you are not doing well or get worse. Document Released: 08/02/2006 Document Revised: 06/14/2011 Document Reviewed: 10/03/2006 [facility] Patient Information 2014 [place], [place].   ________________________________________________________________________

## 2021-02-09 ENCOUNTER — Other Ambulatory Visit: Payer: Self-pay

## 2021-02-09 ENCOUNTER — Encounter (HOSPITAL_COMMUNITY): Payer: Self-pay

## 2021-02-09 ENCOUNTER — Encounter (HOSPITAL_COMMUNITY)
Admission: RE | Admit: 2021-02-09 | Discharge: 2021-02-09 | Disposition: A | Discharge status: Home / Self Care | Payer: Medicare Other | Source: Ambulatory Visit | Attending: Orthopedic Surgery | Admitting: Orthopedic Surgery

## 2021-02-09 VITALS — BP 141/99 | HR 90 | Temp 97.7°F | Ht 70.5 in | Wt 209.0 lb

## 2021-02-09 DIAGNOSIS — Z01812 Encounter for preprocedural laboratory examination: Secondary | ICD-10-CM | POA: Insufficient documentation

## 2021-02-09 DIAGNOSIS — M1612 Unilateral primary osteoarthritis, left hip: Secondary | ICD-10-CM | POA: Insufficient documentation

## 2021-02-09 HISTORY — DX: Personal history of urinary calculi: Z87.442

## 2021-02-09 LAB — SURGICAL PCR SCREEN
MRSA, PCR: POSITIVE — AB
Staphylococcus aureus: POSITIVE — AB

## 2021-02-09 LAB — COMPREHENSIVE METABOLIC PANEL
ALT: 18 U/L (ref 0–44)
AST: 22 U/L (ref 15–41)
Albumin: 4.1 g/dL (ref 3.5–5.0)
Alkaline Phosphatase: 55 U/L (ref 38–126)
Anion gap: 5 (ref 5–15)
BUN: 21 mg/dL (ref 8–23)
CO2: 24 mmol/L (ref 22–32)
Calcium: 9 mg/dL (ref 8.9–10.3)
Chloride: 111 mmol/L (ref 98–111)
Creatinine, Ser: 1.18 mg/dL (ref 0.61–1.24)
GFR, Estimated: >60 mL/min (ref >60)
Glucose, Bld: 123 mg/dL — ABNORMAL HIGH (ref 70–99)
Potassium: 4 mmol/L (ref 3.5–5.1)
Sodium: 140 mmol/L (ref 135–145)
Total Bilirubin: 0.8 mg/dL (ref 0.3–1.2)
Total Protein: 6.9 g/dL (ref 6.5–8.1)

## 2021-02-09 LAB — PROTIME-INR
INR: 1 (ref 0.8–1.2)
Prothrombin Time: 13.1 seconds (ref 11.4–15.2)

## 2021-02-09 LAB — CBC
HCT: 41.4 % (ref 39.0–52.0)
Hemoglobin: 14 g/dL (ref 13.0–17.0)
MCH: 30.6 pg (ref 26.0–34.0)
MCHC: 33.8 g/dL (ref 30.0–36.0)
MCV: 90.4 fL (ref 80.0–100.0)
Platelets: 180 10*3/uL (ref 150–400)
RBC: 4.58 MIL/uL (ref 4.22–5.81)
RDW: 14 % (ref 11.5–15.5)
WBC: 4.6 10*3/uL (ref 4.0–10.5)
nRBC: 0 % (ref 0.0–0.2)

## 2021-02-09 NOTE — Progress Notes (Addendum)
COVID Vaccine Completed: Yes Date COVID Vaccine completed: 07/11/19 x 2 COVID vaccine manufacturer: Moderna    COVID Test: 02/17/21  PCP - Dr. Elfredia Nevins. Cardiologist -   Chest x-ray -  EKG -  Stress Test -  ECHO -  Cardiac Cath -  Pacemaker/ICD device last checked:  Sleep Study -  CPAP -   Fasting Blood Sugar -  Checks Blood Sugar _____ times a day  Blood Thinner Instructions:  Aspirin Instructions: Aspirin 81 mg. Will be held a week before surgery as per Dr. Linna Caprice instructions. Last Dose:  Anesthesia review: Hx: DVT,blood cloths.  Patient denies shortness of breath, fever, cough and chest pain at PAT appointment   Patient verbalized understanding of instructions that were given to them at the PAT appointment. Patient was also instructed that they will need to review over the PAT instructions again at home before surgery.

## 2021-02-09 NOTE — Progress Notes (Signed)
Lab. Results: PCR + MRSA,+ STAPH. 

## 2021-02-17 ENCOUNTER — Other Ambulatory Visit: Payer: Self-pay | Admitting: Orthopedic Surgery

## 2021-02-17 LAB — SARS CORONAVIRUS 2 (TAT 6-24 HRS): SARS Coronavirus 2: NEGATIVE

## 2021-02-19 ENCOUNTER — Ambulatory Visit (HOSPITAL_COMMUNITY): Payer: Medicare Other | Admitting: Anesthesiology

## 2021-02-19 ENCOUNTER — Encounter (HOSPITAL_COMMUNITY)
Admission: RE | Disposition: A | Discharge status: Home / Self Care | Payer: Self-pay | Source: Home / Self Care | Attending: Orthopedic Surgery

## 2021-02-19 ENCOUNTER — Ambulatory Visit (HOSPITAL_COMMUNITY)
Admission: RE | Admit: 2021-02-19 | Discharge: 2021-02-20 | Disposition: A | Discharge status: Home / Self Care | Payer: Medicare Other | Attending: Orthopedic Surgery | Admitting: Orthopedic Surgery

## 2021-02-19 ENCOUNTER — Other Ambulatory Visit: Payer: Self-pay

## 2021-02-19 ENCOUNTER — Ambulatory Visit (HOSPITAL_COMMUNITY): Payer: Medicare Other

## 2021-02-19 ENCOUNTER — Encounter (HOSPITAL_COMMUNITY): Payer: Self-pay | Admitting: Orthopedic Surgery

## 2021-02-19 DIAGNOSIS — M1711 Unilateral primary osteoarthritis, right knee: Secondary | ICD-10-CM | POA: Diagnosis present

## 2021-02-19 DIAGNOSIS — Z79899 Other long term (current) drug therapy: Secondary | ICD-10-CM | POA: Insufficient documentation

## 2021-02-19 DIAGNOSIS — Z86718 Personal history of other venous thrombosis and embolism: Secondary | ICD-10-CM | POA: Diagnosis not present

## 2021-02-19 DIAGNOSIS — M6281 Muscle weakness (generalized): Secondary | ICD-10-CM | POA: Diagnosis not present

## 2021-02-19 DIAGNOSIS — Z471 Aftercare following joint replacement surgery: Secondary | ICD-10-CM | POA: Diagnosis not present

## 2021-02-19 DIAGNOSIS — Z7901 Long term (current) use of anticoagulants: Secondary | ICD-10-CM | POA: Insufficient documentation

## 2021-02-19 DIAGNOSIS — G8918 Other acute postprocedural pain: Secondary | ICD-10-CM | POA: Diagnosis not present

## 2021-02-19 DIAGNOSIS — Z9889 Other specified postprocedural states: Secondary | ICD-10-CM | POA: Diagnosis not present

## 2021-02-19 DIAGNOSIS — K219 Gastro-esophageal reflux disease without esophagitis: Secondary | ICD-10-CM | POA: Insufficient documentation

## 2021-02-19 DIAGNOSIS — Z96651 Presence of right artificial knee joint: Secondary | ICD-10-CM | POA: Diagnosis not present

## 2021-02-19 HISTORY — PX: KNEE ARTHROPLASTY: SHX992

## 2021-02-19 LAB — TYPE AND SCREEN
ABO/RH(D): A POS
Antibody Screen: NEGATIVE

## 2021-02-19 SURGERY — ARTHROPLASTY, KNEE, TOTAL, USING IMAGELESS COMPUTER-ASSISTED NAVIGATION
Anesthesia: Regional | Site: Knee | Laterality: Right

## 2021-02-19 MED ORDER — KETOROLAC TROMETHAMINE 30 MG/ML IJ SOLN
INTRAMUSCULAR | Status: DC | PRN
  Administered 2021-02-19: 30 mg via INTRAVENOUS

## 2021-02-19 MED ORDER — FENTANYL CITRATE (PF) 250 MCG/5ML IJ SOLN
INTRAMUSCULAR | Status: AC
  Filled 2021-02-19: qty 5

## 2021-02-19 MED ORDER — OMEGA 3 500 500 MG PO CAPS: 500.0000 mg | ORAL_CAPSULE | Freq: Every day | ORAL | Status: DC

## 2021-02-19 MED ORDER — BUPIVACAINE HCL (PF) 0.5 % IJ SOLN
INTRAMUSCULAR | Status: DC | PRN
  Administered 2021-02-19: 30 mL

## 2021-02-19 MED ORDER — ONDANSETRON HCL 4 MG/2ML IJ SOLN: 4.0000 mg | Freq: Once | INTRAMUSCULAR | Status: DC | PRN

## 2021-02-19 MED ORDER — METOCLOPRAMIDE HCL 5 MG PO TABS: 5.0000 mg | ORAL_TABLET | Freq: Three times a day (TID) | ORAL | Status: DC | PRN

## 2021-02-19 MED ORDER — ONDANSETRON HCL 4 MG/2ML IJ SOLN
INTRAMUSCULAR | Status: DC | PRN
  Administered 2021-02-19: 4 mg via INTRAVENOUS

## 2021-02-19 MED ORDER — SODIUM CHLORIDE 0.9 % IV SOLN: INTRAVENOUS | Status: DC

## 2021-02-19 MED ORDER — ONDANSETRON HCL 4 MG/2ML IJ SOLN: 4.0000 mg | Freq: Four times a day (QID) | INTRAMUSCULAR | Status: DC | PRN

## 2021-02-19 MED ORDER — BUPIVACAINE-EPINEPHRINE (PF) 0.25% -1:200000 IJ SOLN
INTRAMUSCULAR | Status: AC
  Filled 2021-02-19: qty 30

## 2021-02-19 MED ORDER — METHOCARBAMOL 500 MG PO TABS: 500.0000 mg | ORAL_TABLET | Freq: Four times a day (QID) | ORAL | Status: DC | PRN

## 2021-02-19 MED ORDER — POVIDONE-IODINE 10 % EX SWAB: 2.0000 "application " | Freq: Once | CUTANEOUS | Status: DC

## 2021-02-19 MED ORDER — LORATADINE 10 MG PO TABS
10.0000 mg | ORAL_TABLET | Freq: Every day | ORAL | Status: DC
  Filled 2021-02-19: qty 1

## 2021-02-19 MED ORDER — OMEGA-3-ACID ETHYL ESTERS 1 G PO CAPS
1.0000 g | ORAL_CAPSULE | Freq: Every day | ORAL | Status: DC
  Administered 2021-02-20: 1 g via ORAL
  Filled 2021-02-19: qty 1

## 2021-02-19 MED ORDER — ROCURONIUM BROMIDE 10 MG/ML (PF) SYRINGE
PREFILLED_SYRINGE | INTRAVENOUS | Status: DC | PRN
  Administered 2021-02-19: 80 mg via INTRAVENOUS

## 2021-02-19 MED ORDER — MORPHINE SULFATE (PF) 2 MG/ML IV SOLN: 0.5000 mg | INTRAVENOUS | Status: DC | PRN

## 2021-02-19 MED ORDER — ADULT MULTIVITAMIN W/MINERALS CH
1.0000 | ORAL_TABLET | Freq: Every day | ORAL | Status: DC
  Administered 2021-02-20: 1 via ORAL
  Filled 2021-02-19: qty 1

## 2021-02-19 MED ORDER — DIPHENHYDRAMINE HCL 12.5 MG/5ML PO ELIX: 12.5000 mg | ORAL_SOLUTION | ORAL | Status: DC | PRN

## 2021-02-19 MED ORDER — LIDOCAINE HCL (PF) 2 % IJ SOLN
INTRAMUSCULAR | Status: AC
  Filled 2021-02-19: qty 5

## 2021-02-19 MED ORDER — POVIDONE-IODINE 10 % EX SWAB
2.0000 "application " | Freq: Once | CUTANEOUS | Status: AC
  Administered 2021-02-19: 2 via TOPICAL

## 2021-02-19 MED ORDER — TRANEXAMIC ACID-NACL 1000-0.7 MG/100ML-% IV SOLN
1000.0000 mg | INTRAVENOUS | Status: AC
  Administered 2021-02-19: 11:00:00 1000 mg via INTRAVENOUS
  Filled 2021-02-19: qty 100

## 2021-02-19 MED ORDER — MULTIVITAMINS PO CAPS: 1.0000 | ORAL_CAPSULE | Freq: Every day | ORAL | Status: DC

## 2021-02-19 MED ORDER — HYDROCODONE-ACETAMINOPHEN 7.5-325 MG PO TABS
1.0000 | ORAL_TABLET | ORAL | Status: DC | PRN
  Administered 2021-02-19 – 2021-02-20 (×2): 1 via ORAL
  Filled 2021-02-19 (×2): qty 1
  Filled 2021-02-19: qty 2

## 2021-02-19 MED ORDER — HYDROMORPHONE HCL 1 MG/ML IJ SOLN
INTRAMUSCULAR | Status: AC
  Administered 2021-02-19: 14:00:00 0.5 mg via INTRAVENOUS
  Filled 2021-02-19: qty 1

## 2021-02-19 MED ORDER — STERILE WATER FOR IRRIGATION IR SOLN
Status: DC | PRN
  Administered 2021-02-19 (×2): 1000 mL

## 2021-02-19 MED ORDER — CELECOXIB 200 MG PO CAPS
200.0000 mg | ORAL_CAPSULE | Freq: Two times a day (BID) | ORAL | Status: DC
  Administered 2021-02-19 – 2021-02-20 (×2): 200 mg via ORAL
  Filled 2021-02-19 (×2): qty 1

## 2021-02-19 MED ORDER — PHENYLEPHRINE HCL (PRESSORS) 10 MG/ML IV SOLN
INTRAVENOUS | Status: AC
  Filled 2021-02-19: qty 2

## 2021-02-19 MED ORDER — OXYCODONE HCL 5 MG PO TABS
ORAL_TABLET | ORAL | Status: AC
  Administered 2021-02-19: 16:00:00 5 mg via ORAL
  Filled 2021-02-19: qty 1

## 2021-02-19 MED ORDER — SENNA 8.6 MG PO TABS
1.0000 | ORAL_TABLET | Freq: Two times a day (BID) | ORAL | Status: DC
  Administered 2021-02-19 – 2021-02-20 (×2): 8.6 mg via ORAL
  Filled 2021-02-19 (×2): qty 1

## 2021-02-19 MED ORDER — VANCOMYCIN HCL 1000 MG/200ML IV SOLN
1000.0000 mg | Freq: Two times a day (BID) | INTRAVENOUS | Status: AC
  Administered 2021-02-19: 22:00:00 1000 mg via INTRAVENOUS
  Filled 2021-02-19: qty 200

## 2021-02-19 MED ORDER — SORBITOL 70 % SOLN
30.0000 mL | Freq: Every day | Status: DC | PRN
  Filled 2021-02-19: qty 30

## 2021-02-19 MED ORDER — PHENYLEPHRINE HCL-NACL 20-0.9 MG/250ML-% IV SOLN
INTRAVENOUS | Status: DC | PRN
  Administered 2021-02-19: 50 ug/min via INTRAVENOUS

## 2021-02-19 MED ORDER — CIPROFLOXACIN HCL 500 MG PO TABS
500.0000 mg | ORAL_TABLET | Freq: Two times a day (BID) | ORAL | Status: DC
  Administered 2021-02-19 – 2021-02-20 (×2): 500 mg via ORAL
  Filled 2021-02-19 (×2): qty 1

## 2021-02-19 MED ORDER — IRRISEPT - 450ML BOTTLE WITH 0.05% CHG IN STERILE WATER, USP 99.95% OPTIME
TOPICAL | Status: DC | PRN
  Administered 2021-02-19: 12:00:00 450 mL

## 2021-02-19 MED ORDER — TRANEXAMIC ACID-NACL 1000-0.7 MG/100ML-% IV SOLN
1000.0000 mg | Freq: Once | INTRAVENOUS | Status: AC
  Administered 2021-02-19: 17:00:00 1000 mg via INTRAVENOUS
  Filled 2021-02-19: qty 100

## 2021-02-19 MED ORDER — DOCUSATE SODIUM 100 MG PO CAPS
100.0000 mg | ORAL_CAPSULE | Freq: Two times a day (BID) | ORAL | Status: DC
  Administered 2021-02-19 – 2021-02-20 (×2): 100 mg via ORAL
  Filled 2021-02-19 (×2): qty 1

## 2021-02-19 MED ORDER — ACETAMINOPHEN 10 MG/ML IV SOLN
1000.0000 mg | Freq: Once | INTRAVENOUS | Status: AC
  Administered 2021-02-19: 13:00:00 1000 mg via INTRAVENOUS
  Filled 2021-02-19: qty 100

## 2021-02-19 MED ORDER — ONDANSETRON HCL 4 MG/2ML IJ SOLN
INTRAMUSCULAR | Status: AC
  Filled 2021-02-19: qty 2

## 2021-02-19 MED ORDER — OXYCODONE HCL 5 MG PO TABS: 5.0000 mg | ORAL_TABLET | Freq: Once | ORAL | Status: AC | PRN

## 2021-02-19 MED ORDER — SODIUM CHLORIDE 0.9 % IR SOLN
Status: DC | PRN
  Administered 2021-02-19 (×2): 1000 mL

## 2021-02-19 MED ORDER — METOCLOPRAMIDE HCL 5 MG/ML IJ SOLN: 5.0000 mg | Freq: Three times a day (TID) | INTRAMUSCULAR | Status: DC | PRN

## 2021-02-19 MED ORDER — CHLORHEXIDINE GLUCONATE 0.12 % MT SOLN
15.0000 mL | Freq: Once | OROMUCOSAL | Status: AC
  Administered 2021-02-19: 08:00:00 15 mL via OROMUCOSAL

## 2021-02-19 MED ORDER — METHOCARBAMOL 1000 MG/10ML IJ SOLN
500.0000 mg | Freq: Four times a day (QID) | INTRAVENOUS | Status: DC | PRN
  Filled 2021-02-19: qty 5

## 2021-02-19 MED ORDER — BUPIVACAINE-EPINEPHRINE 0.25% -1:200000 IJ SOLN
INTRAMUSCULAR | Status: DC | PRN
  Administered 2021-02-19: 30 mL

## 2021-02-19 MED ORDER — OXYCODONE HCL 5 MG/5ML PO SOLN: 5.0000 mg | Freq: Once | ORAL | Status: AC | PRN

## 2021-02-19 MED ORDER — CEFAZOLIN SODIUM-DEXTROSE 2-4 GM/100ML-% IV SOLN
2.0000 g | INTRAVENOUS | Status: AC
  Administered 2021-02-19: 11:00:00 2 g via INTRAVENOUS
  Filled 2021-02-19: qty 100

## 2021-02-19 MED ORDER — ALUM & MAG HYDROXIDE-SIMETH 200-200-20 MG/5ML PO SUSP: 30.0000 mL | ORAL | Status: DC | PRN

## 2021-02-19 MED ORDER — DEXAMETHASONE SODIUM PHOSPHATE 10 MG/ML IJ SOLN
INTRAMUSCULAR | Status: AC
  Filled 2021-02-19: qty 1

## 2021-02-19 MED ORDER — LACTATED RINGERS IV SOLN: INTRAVENOUS | Status: DC

## 2021-02-19 MED ORDER — HYDROMORPHONE HCL 1 MG/ML IJ SOLN
0.2500 mg | INTRAMUSCULAR | Status: DC | PRN
  Administered 2021-02-19 (×2): 0.5 mg via INTRAVENOUS

## 2021-02-19 MED ORDER — SODIUM CHLORIDE 0.9% FLUSH
INTRAVENOUS | Status: DC | PRN
  Administered 2021-02-19: 30 mL via INTRAVENOUS

## 2021-02-19 MED ORDER — DEXAMETHASONE SODIUM PHOSPHATE 10 MG/ML IJ SOLN
10.0000 mg | Freq: Once | INTRAMUSCULAR | Status: AC
  Administered 2021-02-20: 10 mg via INTRAVENOUS
  Filled 2021-02-19: qty 1

## 2021-02-19 MED ORDER — ISOPROPYL ALCOHOL 70 % SOLN
Status: DC | PRN
  Administered 2021-02-19: 1 via TOPICAL

## 2021-02-19 MED ORDER — ORAL CARE MOUTH RINSE: 15.0000 mL | Freq: Once | OROMUCOSAL | Status: AC

## 2021-02-19 MED ORDER — FENTANYL CITRATE PF 50 MCG/ML IJ SOSY
50.0000 ug | PREFILLED_SYRINGE | INTRAMUSCULAR | Status: DC
  Administered 2021-02-19: 09:00:00 50 ug via INTRAVENOUS
  Filled 2021-02-19: qty 2

## 2021-02-19 MED ORDER — PANTOPRAZOLE SODIUM 40 MG PO TBEC
40.0000 mg | DELAYED_RELEASE_TABLET | Freq: Every day | ORAL | Status: DC
  Administered 2021-02-20: 40 mg via ORAL
  Filled 2021-02-19: qty 1

## 2021-02-19 MED ORDER — VANCOMYCIN HCL IN DEXTROSE 1-5 GM/200ML-% IV SOLN
1000.0000 mg | INTRAVENOUS | Status: AC
  Administered 2021-02-19: 10:00:00 1000 mg via INTRAVENOUS
  Filled 2021-02-19: qty 200

## 2021-02-19 MED ORDER — FENTANYL CITRATE (PF) 100 MCG/2ML IJ SOLN
INTRAMUSCULAR | Status: DC | PRN
  Administered 2021-02-19 (×2): 25 ug via INTRAVENOUS
  Administered 2021-02-19: 100 ug via INTRAVENOUS

## 2021-02-19 MED ORDER — POLYETHYLENE GLYCOL 3350 17 G PO PACK: 17.0000 g | PACK | Freq: Every day | ORAL | Status: DC | PRN

## 2021-02-19 MED ORDER — SODIUM CHLORIDE (PF) 0.9 % IJ SOLN
INTRAMUSCULAR | Status: AC
  Filled 2021-02-19: qty 30

## 2021-02-19 MED ORDER — HYDROCODONE-ACETAMINOPHEN 5-325 MG PO TABS: 1.0000 | ORAL_TABLET | ORAL | Status: DC | PRN

## 2021-02-19 MED ORDER — DEXAMETHASONE SODIUM PHOSPHATE 4 MG/ML IJ SOLN
INTRAMUSCULAR | Status: DC | PRN
Start: 2021-02-19 — End: 2021-02-19
  Administered 2021-02-19: 4 mg via INTRAVENOUS

## 2021-02-19 MED ORDER — HYDROMORPHONE HCL 1 MG/ML IJ SOLN: 0.2500 mg | INTRAMUSCULAR | Status: DC | PRN

## 2021-02-19 MED ORDER — PROPOFOL 10 MG/ML IV BOLUS
INTRAVENOUS | Status: DC | PRN
  Administered 2021-02-19: 160 mg via INTRAVENOUS

## 2021-02-19 MED ORDER — LIDOCAINE HCL (CARDIAC) PF 100 MG/5ML IV SOSY
PREFILLED_SYRINGE | INTRAVENOUS | Status: DC | PRN
  Administered 2021-02-19: 60 mg via INTRAVENOUS

## 2021-02-19 MED ORDER — KETOROLAC TROMETHAMINE 30 MG/ML IJ SOLN
INTRAMUSCULAR | Status: AC
  Filled 2021-02-19: qty 1

## 2021-02-19 MED ORDER — ONDANSETRON HCL 4 MG PO TABS: 4.0000 mg | ORAL_TABLET | Freq: Four times a day (QID) | ORAL | Status: DC | PRN

## 2021-02-19 MED ORDER — ROCURONIUM BROMIDE 10 MG/ML (PF) SYRINGE
PREFILLED_SYRINGE | INTRAVENOUS | Status: AC
  Filled 2021-02-19: qty 10

## 2021-02-19 MED ORDER — PHENYLEPHRINE 40 MCG/ML (10ML) SYRINGE FOR IV PUSH (FOR BLOOD PRESSURE SUPPORT)
PREFILLED_SYRINGE | INTRAVENOUS | Status: DC | PRN
  Administered 2021-02-19: 80 ug via INTRAVENOUS
  Administered 2021-02-19: 120 ug via INTRAVENOUS

## 2021-02-19 MED ORDER — AMISULPRIDE (ANTIEMETIC) 5 MG/2ML IV SOLN: 10.0000 mg | Freq: Once | INTRAVENOUS | Status: DC | PRN

## 2021-02-19 MED ORDER — MENTHOL 3 MG MT LOZG: 1.0000 | LOZENGE | OROMUCOSAL | Status: DC | PRN

## 2021-02-19 MED ORDER — PHENYLEPHRINE 40 MCG/ML (10ML) SYRINGE FOR IV PUSH (FOR BLOOD PRESSURE SUPPORT)
PREFILLED_SYRINGE | INTRAVENOUS | Status: AC
  Filled 2021-02-19: qty 10

## 2021-02-19 MED ORDER — MIDAZOLAM HCL 2 MG/2ML IJ SOLN
1.0000 mg | INTRAMUSCULAR | Status: DC
Start: 2021-02-19 — End: 2021-02-19
  Filled 2021-02-19: qty 2

## 2021-02-19 MED ORDER — FLEET ENEMA 7-19 GM/118ML RE ENEM: 1.0000 | ENEMA | Freq: Once | RECTAL | Status: DC | PRN

## 2021-02-19 MED ORDER — APIXABAN 2.5 MG PO TABS
2.5000 mg | ORAL_TABLET | Freq: Two times a day (BID) | ORAL | Status: DC
  Administered 2021-02-20: 2.5 mg via ORAL
  Filled 2021-02-19: qty 1

## 2021-02-19 MED ORDER — ACETAMINOPHEN 325 MG PO TABS: 325.0000 mg | ORAL_TABLET | Freq: Four times a day (QID) | ORAL | Status: DC | PRN

## 2021-02-19 MED ORDER — PHENOL 1.4 % MT LIQD: 1.0000 | OROMUCOSAL | Status: DC | PRN

## 2021-02-19 MED ORDER — SUGAMMADEX SODIUM 200 MG/2ML IV SOLN
INTRAVENOUS | Status: DC | PRN
  Administered 2021-02-19: 200 mg via INTRAVENOUS

## 2021-02-19 SURGICAL SUPPLY — 78 items
ADH SKN CLS APL DERMABOND .7 (GAUZE/BANDAGES/DRESSINGS) ×1
BAG COUNTER SPONGE SURGICOUNT (BAG) IMPLANT
BAG SPEC THK2 15X12 ZIP CLS (MISCELLANEOUS)
BAG SPNG CNTER NS LX DISP (BAG)
BAG ZIPLOCK 12X15 (MISCELLANEOUS) IMPLANT
BATTERY INSTRU NAVIGATION (MISCELLANEOUS) ×6 IMPLANT
BLADE SAW RECIPROCATING 77.5 (BLADE) ×2 IMPLANT
BNDG CMPR MED 10X6 ELC LF (GAUZE/BANDAGES/DRESSINGS) ×1
BNDG ELASTIC 4X5.8 VLCR STR LF (GAUZE/BANDAGES/DRESSINGS) ×2 IMPLANT
BNDG ELASTIC 6X10 VLCR STRL LF (GAUZE/BANDAGES/DRESSINGS) ×2 IMPLANT
BNDG ELASTIC 6X5.8 VLCR STR LF (GAUZE/BANDAGES/DRESSINGS) ×2 IMPLANT
BSPLAT TIB 6 KN TRITANIUM (Knees) ×1 IMPLANT
CHLORAPREP W/TINT 26 (MISCELLANEOUS) ×4 IMPLANT
COMPONENT TRI CR RETAIN SZ6 RT (Miscellaneous) ×1 IMPLANT
COVER SURGICAL LIGHT HANDLE (MISCELLANEOUS) ×2 IMPLANT
CUFF TOURN SGL QUICK 34 (TOURNIQUET CUFF) ×2
CUFF TRNQT CYL 34X4.125X (TOURNIQUET CUFF) ×1 IMPLANT
DECANTER SPIKE VIAL GLASS SM (MISCELLANEOUS) ×4 IMPLANT
DERMABOND ADVANCED (GAUZE/BANDAGES/DRESSINGS) ×1
DERMABOND ADVANCED .7 DNX12 (GAUZE/BANDAGES/DRESSINGS) ×1 IMPLANT
DRAPE INCISE IOBAN 66X45 STRL (DRAPES) ×6 IMPLANT
DRAPE SHEET LG 3/4 BI-LAMINATE (DRAPES) ×6 IMPLANT
DRAPE U-SHAPE 47X51 STRL (DRAPES) ×2 IMPLANT
DRSG AQUACEL AG ADV 3.5X10 (GAUZE/BANDAGES/DRESSINGS) IMPLANT
DRSG AQUACEL AG ADV 3.5X14 (GAUZE/BANDAGES/DRESSINGS) ×2 IMPLANT
DRSG TEGADERM 4X4.75 (GAUZE/BANDAGES/DRESSINGS) IMPLANT
ELECT BLADE TIP CTD 4 INCH (ELECTRODE) ×2 IMPLANT
ELECT REM PT RETURN 15FT ADLT (MISCELLANEOUS) ×2 IMPLANT
EVACUATOR 1/8 PVC DRAIN (DRAIN) IMPLANT
GAUZE SPONGE 4X4 12PLY STRL (GAUZE/BANDAGES/DRESSINGS) ×2 IMPLANT
GLOVE SRG 8 PF TXTR STRL LF DI (GLOVE) ×2 IMPLANT
GLOVE SURG ENC MOIS LTX SZ8.5 (GLOVE) ×4 IMPLANT
GLOVE SURG ENC TEXT LTX SZ7.5 (GLOVE) ×6 IMPLANT
GLOVE SURG UNDER POLY LF SZ8 (GLOVE) ×4
GLOVE SURG UNDER POLY LF SZ8.5 (GLOVE) ×2 IMPLANT
GOWN SPEC L3 XXLG W/TWL (GOWN DISPOSABLE) ×2 IMPLANT
GOWN SPEC L4 XLG W/TWL (GOWN DISPOSABLE) ×2 IMPLANT
HANDPIECE INTERPULSE COAX TIP (DISPOSABLE) ×2
HOLDER FOLEY CATH W/STRAP (MISCELLANEOUS) ×2 IMPLANT
HOOD PEEL AWAY FLYTE STAYCOOL (MISCELLANEOUS) ×6 IMPLANT
INSERT KNEE TIB BRG SZ 6 (Insert) ×2 IMPLANT
JET LAVAGE IRRISEPT WOUND (IRRIGATION / IRRIGATOR)
KIT TURNOVER KIT A (KITS) IMPLANT
KNEE TIBIAL COMPONENT SZ6 (Knees) ×2 IMPLANT
LAVAGE JET IRRISEPT WOUND (IRRIGATION / IRRIGATOR) IMPLANT
MARKER SKIN DUAL TIP RULER LAB (MISCELLANEOUS) ×2 IMPLANT
NDL SAFETY ECLIPSE 18X1.5 (NEEDLE) ×1 IMPLANT
NEEDLE HYPO 18GX1.5 SHARP (NEEDLE) ×2
NEEDLE SPNL 18GX3.5 QUINCKE PK (NEEDLE) ×2 IMPLANT
NS IRRIG 1000ML POUR BTL (IV SOLUTION) ×2 IMPLANT
PACK TOTAL KNEE CUSTOM (KITS) ×2 IMPLANT
PADDING CAST COTTON 6X4 STRL (CAST SUPPLIES) ×2 IMPLANT
PATELLA ASYMMETRIC 38X11 KNEE (Orthopedic Implant) ×2 IMPLANT
PIN FLUTED HEDLESS FIX 3.5X1/8 (PIN) ×2 IMPLANT
PROTECTOR NERVE ULNAR (MISCELLANEOUS) ×2 IMPLANT
SAW OSC TIP CART 19.5X105X1.3 (SAW) ×2 IMPLANT
SEALER BIPOLAR AQUA 6.0 (INSTRUMENTS) ×2 IMPLANT
SET HNDPC FAN SPRY TIP SCT (DISPOSABLE) ×1 IMPLANT
SET PAD KNEE POSITIONER (MISCELLANEOUS) ×2 IMPLANT
SPONGE DRAIN TRACH 4X4 STRL 2S (GAUZE/BANDAGES/DRESSINGS) IMPLANT
SUT MNCRL AB 3-0 PS2 18 (SUTURE) ×2 IMPLANT
SUT MNCRL AB 4-0 PS2 18 (SUTURE) ×2 IMPLANT
SUT MON AB 2-0 CT1 36 (SUTURE) ×4 IMPLANT
SUT STRATAFIX PDO 1 14 VIOLET (SUTURE) ×2
SUT STRATFX PDO 1 14 VIOLET (SUTURE) ×1
SUT VIC AB 1 CT1 27 (SUTURE) ×2
SUT VIC AB 1 CT1 27XBRD ANTBC (SUTURE) ×1 IMPLANT
SUT VIC AB 1 CTX 36 (SUTURE) ×4
SUT VIC AB 1 CTX36XBRD ANBCTR (SUTURE) ×2 IMPLANT
SUT VIC AB 2-0 CT1 27 (SUTURE) ×2
SUT VIC AB 2-0 CT1 TAPERPNT 27 (SUTURE) ×1 IMPLANT
SUTURE STRATFX PDO 1 14 VIOLET (SUTURE) ×1 IMPLANT
SYR 3ML LL SCALE MARK (SYRINGE) ×2 IMPLANT
TOWER CARTRIDGE SMART MIX (DISPOSABLE) IMPLANT
TRAY FOLEY MTR SLVR 16FR STAT (SET/KITS/TRAYS/PACK) IMPLANT
TRIATHLON CRUCIATE RETAIN SZ6 (Miscellaneous) ×2 IMPLANT
TUBE SUCTION HIGH CAP CLEAR NV (SUCTIONS) ×2 IMPLANT
WATER STERILE IRR 1000ML POUR (IV SOLUTION) ×4 IMPLANT

## 2021-02-19 NOTE — Transfer of Care (Signed)
Immediate Anesthesia Transfer of Care Note  Patient: Gary Ferguson  Procedure(s) Performed: COMPUTER ASSISTED TOTAL KNEE ARTHROPLASTY (Right: Knee)  Patient Location: PACU  Anesthesia Type:General  Level of Consciousness: awake  Airway & Oxygen Therapy: Patient Spontanous Breathing and Patient connected to face mask oxygen  Post-op Assessment: Report given to RN and Post -op Vital signs reviewed and stable  Post vital signs: Reviewed and stable  Last Vitals:  Vitals Value Taken Time  BP 107/76 02/19/21 1341  Temp    Pulse 81 02/19/21 1343  Resp 21 02/19/21 1343  SpO2 97 % 02/19/21 1343  Vitals shown include unvalidated device data.  Last Pain:  Vitals:   02/19/21 0925  TempSrc:   PainSc: Asleep         Complications: No notable events documented.

## 2021-02-19 NOTE — Anesthesia Procedure Notes (Signed)
Procedure Name: Intubation Date/Time: 02/19/2021 10:59 AM Performed by: Caren Macadam, CRNA Pre-anesthesia Checklist: Patient identified, Emergency Drugs available, Suction available and Patient being monitored Patient Re-evaluated:Patient Re-evaluated prior to induction Oxygen Delivery Method: Circle system utilized Preoxygenation: Pre-oxygenation with 100% oxygen Induction Type: IV induction Ventilation: Mask ventilation without difficulty Laryngoscope Size: 2 Grade View: Grade I Tube type: Oral Tube size: 7.5 mm Number of attempts: 1 Airway Equipment and Method: Stylet Placement Confirmation: ETT inserted through vocal cords under direct vision, positive ETCO2 and breath sounds checked- equal and bilateral Secured at: 24 cm Tube secured with: Tape Dental Injury: Teeth and Oropharynx as per pre-operative assessment

## 2021-02-19 NOTE — Progress Notes (Signed)
Assisted Dr. Bass with right, ultrasound guided, adductor canal block. Side rails up, monitors on throughout procedure. See vital signs in flow sheet. Tolerated Procedure well.  

## 2021-02-19 NOTE — Anesthesia Postprocedure Evaluation (Signed)
Anesthesia Post Note  Patient: Gary Ferguson  Procedure(s) Performed: COMPUTER ASSISTED TOTAL KNEE ARTHROPLASTY (Right: Knee)     Patient location during evaluation: PACU Anesthesia Type: Regional Level of consciousness: awake and alert, patient cooperative and oriented Pain management: pain level controlled Vital Signs Assessment: post-procedure vital signs reviewed and stable Respiratory status: spontaneous breathing, nonlabored ventilation and respiratory function stable Cardiovascular status: blood pressure returned to baseline and stable Postop Assessment: no apparent nausea or vomiting and adequate PO intake Anesthetic complications: no   No notable events documented.  Last Vitals:  Vitals:   02/19/21 1543 02/19/21 1545  BP:  (!) 153/97  Pulse: 97 (!) 108  Resp: 15 15  Temp:    SpO2: 96% 100%    Last Pain:  Vitals:   02/19/21 1543  TempSrc:   PainSc: 6                  Jakiya Bookbinder,E. Belal Scallon

## 2021-02-19 NOTE — Evaluation (Signed)
Physical Therapy Evaluation Patient Details Name: Gary Ferguson MRN: [id number] DOB: [birth year] Today's Date: 02/19/2021  History of Present Illness  Patient is 65 y.o. 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    Clinical Impression  DAOUD LOBUE is a 65 y.o. male POD 0 s/p Rt TKA. Patient reports independence with mobility at baseline. Patient is now limited by functional impairments (see PT problem list below) and requires min guard/assist for transfers and gait with RW. Patient was able to ambulate ~70 feet with RW and min guard/assist. Patient instructed in exercise to facilitate ROM and circulation to manage edema. Patient will benefit from continued skilled PT interventions to address impairments and progress towards PLOF. Acute PT will follow to progress mobility and stair training in preparation for safe discharge home.        Recommendations for follow up therapy are one component of a multi-disciplinary discharge planning process, led by the attending physician.  Recommendations may be updated based on patient status, additional functional criteria and insurance authorization.  Follow Up Recommendations Follow physician's recommendations for discharge plan and follow up therapies    Assistance Recommended at Discharge Intermittent Supervision/Assistance  Functional Status Assessment Patient has had a recent decline in their functional status and demonstrates the ability to make significant improvements in function in a reasonable and predictable amount of time.  Equipment Recommendations  None recommended by PT    Recommendations for Other Services       Precautions / Restrictions Precautions Precautions: Fall Restrictions Weight Bearing Restrictions: No RLE Weight Bearing: Weight bearing as tolerated      Mobility  Bed Mobility Overal bed mobility: Needs Assistance Bed Mobility:  Supine to Sit     Supine to sit: Min assist;HOB elevated          Transfers Overall transfer level: Needs assistance Equipment used: Rolling walker (2 wheels) Transfers: Sit to/from Stand Sit to Stand: Min guard;Supervision                Ambulation/Gait Ambulation/Gait assistance: Min assist;Min guard Gait Distance (Feet): 70 Feet Assistive device: Rolling walker (2 wheels) Gait Pattern/deviations: Step-to pattern;Decreased stride length;Decreased weight shift to right Gait velocity: decr     General Gait Details: cues for step to pattern and proximity to RW, p maintained throughout and progressed to min guard for safety. pt legally blind but able to negotiate environment.  Stairs            Wheelchair Mobility    Modified Rankin (Stroke Patients Only)       Balance Overall balance assessment: Needs assistance Sitting-balance support: Feet supported Sitting balance-Leahy Scale: Good     Standing balance support: Reliant on assistive device for balance;During functional activity;Bilateral upper extremity supported Standing balance-Leahy Scale: Fair                               Pertinent Vitals/Pain Pain Assessment: 0-10 Pain Score: 5  Pain Location: Rt knee Pain Descriptors / Indicators: Aching;Discomfort Pain Intervention(s): Limited activity within patient's tolerance;Monitored during session;Repositioned;Ice applied    Home Living Family/patient expects to be discharged to:: Private residence Living Arrangements: Other relatives Available Help at Discharge: Family Type of Home: House Home Access: Stairs to enter Entrance Stairs-Rails: None Entrance Stairs-Number of Steps: 2   Home Layout: One level Home Equipment: Agricultural consultant (2 wheels);Cane -  single point;Shower seat;BSC/3in1      Prior Function Prior Level of Function : Independent/Modified Independent             Mobility Comments: works on the farm        Higher education careers adviser   Dominant Hand: Right    Extremity/Trunk Assessment   Upper Extremity Assessment Upper Extremity Assessment: Overall WFL for tasks assessed    Lower Extremity Assessment Lower Extremity Assessment: RLE deficits/detail RLE Deficits / Details: good quad acitvation, no extensor lag with SLR RLE Sensation: WNL RLE Coordination: WNL    Cervical / Trunk Assessment Cervical / Trunk Assessment: Normal  Communication   Communication: No difficulties  Cognition Arousal/Alertness: Awake/alert Behavior During Therapy: WFL for tasks assessed/performed Overall Cognitive Status: Within Functional Limits for tasks assessed                                          General Comments      Exercises Total Joint Exercises Ankle Circles/Pumps: AROM;Both;20 reps;Seated   Assessment/Plan    PT Assessment Patient needs continued PT services  PT Problem List Decreased strength;Decreased range of motion;Decreased activity tolerance;Decreased balance;Decreased mobility;Decreased knowledge of use of DME;Decreased knowledge of precautions       PT Treatment Interventions DME instruction;Gait training;Stair training;Functional mobility training;Therapeutic exercise;Therapeutic activities;Balance training;Patient/family education    PT Goals (Current goals can be found in the Care Plan section)  Acute Rehab PT Goals Patient Stated Goal: recover, get home, and keep working on farm PT Goal Formulation: With patient Time For Goal Achievement: 02/26/21 Potential to Achieve Goals: Good    Frequency 7X/week   Barriers to discharge        Co-evaluation               AM-PAC PT "6 Clicks" Mobility  Outcome Measure Help needed turning from your back to your side while in a flat bed without using bedrails?: A Little Help needed moving from lying on your back to sitting on the side of a flat bed without using bedrails?: A Little Help needed moving to and  from a bed to a chair (including a wheelchair)?: A Little Help needed standing up from a chair using your arms (e.g., wheelchair or bedside chair)?: A Little Help needed to walk in hospital room?: A Little Help needed climbing 3-5 steps with a railing? : A Little 6 Click Score: 18    End of Session Equipment Utilized During Treatment: Gait belt Activity Tolerance: Patient tolerated treatment well Patient left: in chair;with call bell/phone within reach;with chair alarm set Nurse Communication: Mobility status PT Visit Diagnosis: Muscle weakness (generalized) (M62.81);Difficulty in walking, not elsewhere classified (R26.2)    Time: 0867-6195 PT Time Calculation (min) (ACUTE ONLY): 20 min   Charges:   PT Evaluation $PT Eval Low Complexity: 1 Low          Wynn Maudlin, DPT Acute Rehabilitation Services Office [phone] Pager [phone]   Anitra Lauth 02/19/2021, 6:11 PM

## 2021-02-19 NOTE — Anesthesia Preprocedure Evaluation (Signed)
Anesthesia Evaluation  Patient identified by MRN, date of birth, ID band Patient awake    Reviewed: Allergy & Precautions, NPO status , Patient's Chart, lab work & pertinent test results  Airway Mallampati: II  TM Distance: >3 FB Neck ROM: Full    Dental no notable dental hx.    Pulmonary neg pulmonary ROS,    Pulmonary exam normal breath sounds clear to auscultation       Cardiovascular Exercise Tolerance: Good + DVT (on Eliquis for history of DVT)  Normal cardiovascular exam Rhythm:Regular Rate:Normal     Neuro/Psych negative neurological ROS  negative psych ROS   GI/Hepatic Neg liver ROS, GERD  Medicated,  Endo/Other  negative endocrine ROS  Renal/GU negative Renal ROS  negative genitourinary   Musculoskeletal  (+) Arthritis , Osteoarthritis,    Abdominal Normal abdominal exam  (+)   Peds negative pediatric ROS (+)  Hematology negative hematology ROS (+)   Anesthesia Other Findings   Reproductive/Obstetrics negative OB ROS                             Anesthesia Physical Anesthesia Plan  ASA: 2  Anesthesia Plan: General and Regional   Post-op Pain Management:    Induction: Intravenous  PONV Risk Score and Plan: Treatment may vary due to age or medical condition, Ondansetron, Dexamethasone, Midazolam and Propofol infusion  Airway Management Planned: Oral ETT  Additional Equipment: None  Intra-op Plan:   Post-operative Plan: Extubation in OR  Informed Consent: I have reviewed the patients History and Physical, chart, labs and discussed the procedure including the risks, benefits and alternatives for the proposed anesthesia with the patient or authorized representative who has indicated his/her understanding and acceptance.     Dental advisory given  Plan Discussed with: CRNA, Anesthesiologist and Surgeon  Anesthesia Plan Comments:         Anesthesia Quick  Evaluation

## 2021-02-19 NOTE — Discharge Instructions (Addendum)
 Dr. Brian Swinteck Total Joint Specialist [facility] [address] , [place] [address] [phone]  TOTAL KNEE REPLACEMENT POSTOPERATIVE DIRECTIONS    Knee Rehabilitation, Guidelines Following Surgery  Results after knee surgery are often greatly improved when you follow the exercise, range of motion and muscle strengthening exercises prescribed by your doctor. Safety measures are also important to protect the knee from further injury. Any time any of these exercises cause you to have increased pain or swelling in your knee joint, decrease the amount until you are comfortable again and slowly increase them. If you have problems or questions, call your caregiver or physical therapist for advice.   WEIGHT BEARING Weight bearing as tolerated with assist device (walker, cane, etc) as directed, use it as long as suggested by your surgeon or therapist, typically at least 4-6 weeks.  HOME CARE INSTRUCTIONS  Remove items at home which could result in a fall. This includes throw rugs or furniture in walking pathways.  Continue medications as instructed at time of discharge. You may have some home medications which will be placed on hold until you complete the course of blood thinner medication.  You may start showering once you are discharged home but do not submerge the incision under water. Just pat the incision dry and apply a dry gauze dressing on daily. Walk with walker as instructed.  You may resume a sexual relationship in one month or when given the OK by your doctor.  Use walker as long as suggested by your caregivers. Avoid periods of inactivity such as sitting longer than an hour when not asleep. This helps prevent blood clots.  You may put full weight on your legs and walk as much as is comfortable.  You may return to work once you are cleared by your doctor.  Do not drive a car for 6 weeks or until released by you surgeon.  Do not drive while  taking narcotics.  Wear the elastic stockings for three weeks following surgery during the day but you may remove then at night. Make sure you keep all of your appointments after your operation with all of your doctors and caregivers. You should call the office at the above phone number and make an appointment for approximately two weeks after the date of your surgery. Do not remove your surgical dressing. The dressing is waterproof; you may take showers in 3 days, but do not take tub baths or submerge the dressing. Please pick up a stool softener and laxative for home use as long as you are requiring pain medications. ICE to the affected knee every three hours for 30 minutes at a time and then as needed for pain and swelling.  Continue to use ice on the knee for pain and swelling from surgery. You may notice swelling that will progress down to the foot and ankle.  This is normal after surgery.  Elevate the leg when you are not up walking on it.   It is important for you to complete the blood thinner medication as prescribed by your doctor. Continue to use the breathing machine which will help keep your temperature down.  It is common for your temperature to cycle up and down following surgery, especially at night when you are not up moving around and exerting yourself.  The breathing machine keeps your lungs expanded and your temperature down.  RANGE OF MOTION AND STRENGTHENING EXERCISES  Rehabilitation of the knee is important following a knee injury or an   operation. After just a few days of immobilization, the muscles of the thigh which control the knee become weakened and shrink (atrophy). Knee exercises are designed to build up the tone and strength of the thigh muscles and to improve knee motion. Often times heat used for twenty to thirty minutes before working out will loosen up your tissues and help with improving the range of motion but do not use heat for the first two weeks following surgery.  These exercises can be done on a training (exercise) mat, on the floor, on a table or on a bed. Use what ever works the best and is most comfortable for you Knee exercises include:  Leg Lifts - While your knee is still immobilized in a splint or cast, you can do straight leg raises. Lift the leg to 60 degrees, hold for 3 sec, and slowly lower the leg. Repeat 10-20 times 2-3 times daily. Perform this exercise against resistance later as your knee gets better.  Quad and Hamstring Sets - Tighten up the muscle on the front of the thigh (Quad) and hold for 5-10 sec. Repeat this 10-20 times hourly. Hamstring sets are done by pushing the foot backward against an object and holding for 5-10 sec. Repeat as with quad sets.  A rehabilitation program following serious knee injuries can speed recovery and prevent re-injury in the future due to weakened muscles. Contact your doctor or a physical therapist for more information on knee rehabilitation.   POST-OPERATIVE OPIOID TAPER INSTRUCTIONS: It is important to wean off of your opioid medication as soon as possible. If you do not need pain medication after your surgery it is ok to stop day one. Opioids include: Codeine, Hydrocodone(Norco, Vicodin), Oxycodone(Percocet, oxycontin) and hydromorphone amongst others.  Long term and even short term use of opiods can cause: Increased pain response Dependence Constipation Depression Respiratory depression And more.  Withdrawal symptoms can include Flu like symptoms Nausea, vomiting And more Techniques to manage these symptoms Hydrate well Eat regular healthy meals Stay active Use relaxation techniques(deep breathing, meditating, yoga) Do Not substitute Alcohol to help with tapering If you have been on opioids for less than two weeks and do not have pain than it is ok to stop all together.  Plan to wean off of opioids This plan should start within one week post op of your joint replacement. Maintain the same  interval or time between taking each dose and first decrease the dose.  Cut the total daily intake of opioids by one tablet each day Next start to increase the time between doses. The last dose that should be eliminated is the evening dose.    SKILLED REHAB INSTRUCTIONS: If the patient is transferred to a skilled rehab facility following release from the hospital, a list of the current medications will be sent to the facility for the patient to continue.  When discharged from the skilled rehab facility, please have the facility set up the patient's Home Health Physical Therapy prior to being released. Also, the skilled facility will be responsible for providing the patient with their medications at time of release from the facility to include their pain medication, the muscle relaxants, and their blood thinner medication. If the patient is still at the rehab facility at time of the two week follow up appointment, the skilled rehab facility will also need to assist the patient in arranging follow up appointment in our office and any transportation needs.  MAKE SURE YOU:  Understand these instructions.  Will watch   your condition.  Will get help right away if you are not doing well or get worse.    Pick up stool softner and laxative for home use following surgery while on pain medications. Do NOT remove your dressing. You may shower.  Do not take tub baths or submerge incision under water. May shower starting three days after surgery. Please use a clean towel to pat the incision dry following showers. Continue to use ice for pain and swelling after surgery. Do not use any lotions or creams on the incision until instructed by your surgeon. Please make sure you are taking your daily blood pressure medications at home.   Information on my medicine - ELIQUIS (apixaban)  This medication education was reviewed with me or my healthcare representative as part of my discharge preparation.  The  pharmacist that spoke with me during my hospital stay was:    Why was Eliquis prescribed for you? Eliquis was prescribed for you to reduce the risk of blood clots forming after orthopedic surgery.    What do You need to know about Eliquis? Take your Eliquis TWICE DAILY - one tablet in the morning and one tablet in the evening with or without food.  It would be best to take the dose about the same time each day.  If you have difficulty swallowing the tablet whole please discuss with your pharmacist how to take the medication safely.  Take Eliquis exactly as prescribed by your doctor and DO NOT stop taking Eliquis without talking to the doctor who prescribed the medication.  Stopping without other medication to take the place of Eliquis may increase your risk of developing a clot.  After discharge, you should have regular check-up appointments with your healthcare provider that is prescribing your Eliquis.  What do you do if you miss a dose? If a dose of ELIQUIS is not taken at the scheduled time, take it as soon as possible on the same day and twice-daily administration should be resumed.  The dose should not be doubled to make up for a missed dose.  Do not take more than one tablet of ELIQUIS at the same time.  Important Safety Information A possible side effect of Eliquis is bleeding. You should call your healthcare provider right away if you experience any of the following: Bleeding from an injury or your nose that does not stop. Unusual colored urine (red or dark brown) or unusual colored stools (red or black). Unusual bruising for unknown reasons. A serious fall or if you hit your head (even if there is no bleeding).  Some medicines may interact with Eliquis and might increase your risk of bleeding or clotting while on Eliquis. To help avoid this, consult your healthcare provider or pharmacist prior to using any new prescription or non-prescription medications, including  herbals, vitamins, non-steroidal anti-inflammatory drugs (NSAIDs) and supplements.  This website has more information on Eliquis (apixaban): http://www.eliquis.com/eliquis/home   

## 2021-02-19 NOTE — Care Plan (Signed)
Ortho Bundle Case Management Note  Patient Details  Name: Gary Ferguson MRN: [id number] Date of Birth: 01/23/1956  R TKA on 02-19-21 DCP:  Home with grandson.  1 story home with 2 ste. DME:  No needs.  Has a RW and 3-in-1. PT:  [facility]  on 02-23-21.                   DME Arranged:  N/A DME Agency:  NA  HH Arranged:  NA HH Agency:  NA  Additional Comments: Please contact me with any questions of if this plan should need to change.  Ennis Forts, RN,CCM [facility]  [phone] 02/19/2021, 10:17 AM

## 2021-02-19 NOTE — Interval H&P Note (Signed)
History and Physical Interval Note:  02/19/2021 9:27 AM  Gary Ferguson  has presented today for surgery, with the diagnosis of Right knee osteoarthritis.  The various methods of treatment have been discussed with the patient and family. After consideration of risks, benefits and other options for treatment, the patient has consented to  Procedure(s): COMPUTER ASSISTED TOTAL KNEE ARTHROPLASTY (Right) as a surgical intervention.  The patient's history has been reviewed, patient examined, no change in status, stable for surgery.  I have reviewed the patient's chart and labs.  Questions were answered to the patient's satisfaction.     [name]

## 2021-02-19 NOTE — Anesthesia Procedure Notes (Signed)
Anesthesia Regional Block: Adductor canal block   Pre-Anesthetic Checklist: , timeout performed,  Correct Patient, Correct Site, Correct Laterality,  Correct Procedure, Correct Position, site marked,  Risks and benefits discussed,  Surgical consent,  Pre-op evaluation,  At surgeon's request and post-op pain management  Laterality: Right  Prep: chloraprep       Needles:  Injection technique: Single-shot  Needle Type: Echogenic Stimulator Needle     Needle Length: 10cm  Needle Gauge: 20     Additional Needles:   Procedures:,,,, ultrasound used (permanent image in chart),,    Narrative:  Start time: 02/19/2021 8:40 AM End time: 02/19/2021 8:45 AM Injection made incrementally with aspirations every 5 mL.  Performed by: Personally  Anesthesiologist: Mellody Dance, MD  Additional Notes: Functioning IV was confirmed and monitors were applied.  Sterile prep and drape,hand hygiene and sterile gloves were used. Ultrasound guidance: relevant anatomy identified, needle position confirmed, local anesthetic spread visualized around nerve(s)., vascular puncture avoided. Negative aspiration and negative test dose prior to incremental administration of local anesthetic. The patient tolerated the procedure well.

## 2021-02-19 NOTE — Op Note (Signed)
OPERATIVE REPORT  SURGEON: [name], MD   ASSISTANT: Barrie Dunker, PA-C [name], PA-C  PREOPERATIVE DIAGNOSIS: Right knee arthritis.   POSTOPERATIVE DIAGNOSIS: Right knee arthritis.   PROCEDURE: Right total knee arthroplasty.   IMPLANTS: Stryker Triathlon CR femur, size 6. Stryker Tritanium tibia, size 6. X3 polyethelyene insert, size 9 mm, CR. 3 button asymmetric patella, size 38 mm.  ANESTHESIA:  General and Regional  TOURNIQUET TIME: Not utilized.   ESTIMATED BLOOD LOSS:-500 mL    ANTIBIOTICS: 1 g clindamycin.  DRAINS: None.  COMPLICATIONS: None   CONDITION: PACU - hemodynamically stable.   BRIEF CLINICAL NOTE: Gary Ferguson is a 65 y.o. male with a long-standing history of Right knee arthritis. After failing conservative management, the patient was indicated for total knee arthroplasty. The risks, benefits, and alternatives to the procedure were explained, and the patient elected to proceed.  PROCEDURE IN DETAIL: Adductor canal block was obtained in the pre-op holding area. Once inside the operative room, general anesthesia was obtained, and a foley catheter was inserted. The patient was then positioned, a nonsterile tourniquet was placed, and the lower extremity was prepped and draped in the normal sterile surgical fashion.  A time-out was called verifying side and site of surgery. The patient received IV antibiotics within 60 minutes of beginning the procedure. The tourniquet was not utilized.   An anterior approach to the knee was performed utilizing a midvastus arthrotomy. A medial release was performed and the patellar fat pad was excised. Stryker imageless navigation was used to cut the distal femur perpendicular to the mechanical axis. A freehand patellar resection was performed, and the patella was sized and prepared with 3 lug holes.  Nagivation  was used to make a neutral proximal tibia resection, taking 2 mm of bone from the medial side with 3 degrees of slope. The menisci were excised. A spacer block was placed, and the alignment and balance in extension were confirmed.   The distal femur was sized using the 3-degree external rotation guide referencing the posterior femoral cortex. The appropriate 4-in-1 cutting block was pinned into place. Rotation was checked using Whiteside's line, the epicondylar axis, and then confirmed with a spacer block in flexion. The remaining femoral cuts were performed, taking care to protect the MCL.  The tibia was sized and the trial tray was pinned into place. The remaining trail components were inserted. The knee was stable to varus and valgus stress through a full range of motion. The patella tracked centrally, and the PCL was well balanced. The trial components were removed, and the proximal tibial surface was prepared. Final components were impacted into place. The knee was tested for a final time and found to be well balanced.   The wound was copiously irrigated with Irrisept solution and normal saline using pule lavage.  Marcaine solution was injected into the periarticular soft tissue.  The wound was closed in layers using #1 Vicryl and Stratafix for the fascia, 2-0 Vicryl for the subcutaneous fat, 2-0 Monocryl for the deep dermal layer, 3-0 running Monocryl subcuticular Stitch, and 4-0 Monocryl stay sutures at both ends of the wound. Dermabond was applied to the skin.  Once the glue was fully dried, an Aquacell Ag and compressive dressing were applied.  Tthe patient was transported to the recovery room in stable condition.  Sponge, needle, and instrument counts were correct at the end of the case x2.  The patient tolerated the procedure well and there were no known complications.  Please note  that a surgical assistant was a medical necessity for this procedure in order to perform it in a safe and expeditious  manner. Surgical assistant was necessary to retract the ligaments and vital neurovascular structures to prevent injury to them and also necessary for proper positioning of the limb to allow for anatomic placement of the prosthesis.

## 2021-02-20 DIAGNOSIS — Z86718 Personal history of other venous thrombosis and embolism: Secondary | ICD-10-CM | POA: Diagnosis not present

## 2021-02-20 DIAGNOSIS — Z7901 Long term (current) use of anticoagulants: Secondary | ICD-10-CM | POA: Diagnosis not present

## 2021-02-20 DIAGNOSIS — Z79899 Other long term (current) drug therapy: Secondary | ICD-10-CM | POA: Diagnosis not present

## 2021-02-20 DIAGNOSIS — M6281 Muscle weakness (generalized): Secondary | ICD-10-CM | POA: Diagnosis not present

## 2021-02-20 DIAGNOSIS — K219 Gastro-esophageal reflux disease without esophagitis: Secondary | ICD-10-CM | POA: Diagnosis not present

## 2021-02-20 DIAGNOSIS — M1711 Unilateral primary osteoarthritis, right knee: Secondary | ICD-10-CM | POA: Diagnosis not present

## 2021-02-20 LAB — BASIC METABOLIC PANEL
Anion gap: 7 (ref 5–15)
BUN: 19 mg/dL (ref 8–23)
CO2: 23 mmol/L (ref 22–32)
Calcium: 8.1 mg/dL — ABNORMAL LOW (ref 8.9–10.3)
Chloride: 105 mmol/L (ref 98–111)
Creatinine, Ser: 1.22 mg/dL (ref 0.61–1.24)
GFR, Estimated: >60 mL/min (ref >60)
Glucose, Bld: 149 mg/dL — ABNORMAL HIGH (ref 70–99)
Potassium: 4.4 mmol/L (ref 3.5–5.1)
Sodium: 135 mmol/L (ref 135–145)

## 2021-02-20 LAB — CBC
HCT: 35.4 % — ABNORMAL LOW (ref 39.0–52.0)
Hemoglobin: 11.6 g/dL — ABNORMAL LOW (ref 13.0–17.0)
MCH: 30.5 pg (ref 26.0–34.0)
MCHC: 32.8 g/dL (ref 30.0–36.0)
MCV: 93.2 fL (ref 80.0–100.0)
Platelets: 147 10*3/uL — ABNORMAL LOW (ref 150–400)
RBC: 3.8 MIL/uL — ABNORMAL LOW (ref 4.22–5.81)
RDW: 13.8 % (ref 11.5–15.5)
WBC: 10 10*3/uL (ref 4.0–10.5)
nRBC: 0 % (ref 0.0–0.2)

## 2021-02-20 MED ORDER — DOCUSATE SODIUM 100 MG PO CAPS: 100.0000 mg | ORAL_CAPSULE | Freq: Two times a day (BID) | ORAL | 3 refills | Status: DC

## 2021-02-20 MED ORDER — APIXABAN 2.5 MG PO TABS: 2.5000 mg | ORAL_TABLET | Freq: Two times a day (BID) | ORAL | 0 refills | Status: DC

## 2021-02-20 MED ORDER — SENNA 8.6 MG PO TABS: 2.0000 | ORAL_TABLET | Freq: Every day | ORAL | 0 refills | Status: DC

## 2021-02-20 MED ORDER — HYDROCODONE-ACETAMINOPHEN 7.5-325 MG PO TABS: 1.0000 | ORAL_TABLET | ORAL | 0 refills | Status: DC | PRN

## 2021-02-20 MED ORDER — ONDANSETRON HCL 4 MG PO TABS
4.0000 mg | ORAL_TABLET | Freq: Four times a day (QID) | ORAL | 0 refills | Status: DC | PRN
Start: 2021-02-20 — End: 2021-04-18

## 2021-02-20 NOTE — Progress Notes (Signed)
Physical Therapy Treatment Patient Details Name: Gary Ferguson MRN: [id number] DOB: 1955-04-17 Today's Date: 02/20/2021   History of Present Illness Patient is 65 y.o. 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    PT Comments    Pt is POD # 1 and is progressing excellent.  His pain is a 1/10, excellent quad activation and knee ROM, ambulating 120' and performed stairs without difficulty with AD. Pt demonstrates safe gait & transfers in order to return home from PT perspective once discharged by MD.  While in hospital, will continue to benefit from PT for skilled therapy to advance mobility and exercises.      Recommendations for follow up therapy are one component of a multi-disciplinary discharge planning process, led by the attending physician.  Recommendations may be updated based on patient status, additional functional criteria and insurance authorization.  Follow Up Recommendations  Follow physician's recommendations for discharge plan and follow up therapies     Assistance Recommended at Discharge Intermittent Supervision/Assistance  Equipment Recommendations  None recommended by PT    Recommendations for Other Services       Precautions / Restrictions Precautions Precautions: Fall Restrictions Weight Bearing Restrictions: No RLE Weight Bearing: Weight bearing as tolerated     Mobility  Bed Mobility Overal bed mobility: Needs Assistance Bed Mobility: Supine to Sit;Sit to Supine     Supine to sit: Modified independent (Device/Increase time) Sit to supine: Modified independent (Device/Increase time)        Transfers Overall transfer level: Needs assistance Equipment used: Rolling walker (2 wheels) Transfers: Sit to/from Stand Sit to Stand: Supervision           General transfer comment: min cues for R LE management with sitting    Ambulation/Gait Ambulation/Gait  assistance: Supervision Gait Distance (Feet): 120 Feet Assistive device: Rolling walker (2 wheels) Gait Pattern/deviations: Decreased weight shift to right;Step-through pattern Gait velocity: decr     General Gait Details: min decrease in weight shift to RW; steady; cues for step through pattern as able; legally blind but able to negotiate environment   Stairs Stairs: Yes   Stair Management: Step to pattern Number of Stairs: 4 General stair comments: Pt reports can reach inside and grab door frame as he doesn't have rail - performed 2 steps with pt reaching up to top of rail with L hand to simulate - performed without difficulty.  Did have pt perform backward wtih RW but he preferred single UE support   Wheelchair Mobility    Modified Rankin (Stroke Patients Only)       Balance Overall balance assessment: Needs assistance Sitting-balance support: Feet supported Sitting balance-Leahy Scale: Normal     Standing balance support: Bilateral upper extremity supported;No upper extremity supported Standing balance-Leahy Scale: Good Standing balance comment: RW to ambulate but could static stand.  Pt also took a couple steps picking RW up - cued to use RW for now for safety/pain control                            Cognition Arousal/Alertness: Awake/alert Behavior During Therapy: [facility] for tasks assessed/performed Overall Cognitive Status: Within Functional Limits for tasks assessed  Exercises Total Joint Exercises Ankle Circles/Pumps: AROM;Both;15 reps;Supine Quad Sets: AROM;Both;10 reps;Supine Towel Squeeze: AROM;Both;10 reps;Supine Hip ABduction/ADduction: AROM;Right;10 reps;Supine Long Arc Quad: AROM;Right;10 reps;Seated Knee Flexion: AROM;Right;10 reps;Seated Goniometric ROM: R knee 0 to 90 degrees Other Exercises Other Exercises: Educated on correct form and AAROM techniques if needed    General  Comments  Educated on safe ice use, no pivots, car transfers, resting with leg straight. Also, encouraged walking every 1-2 hours during day. Educated on HEP with focus on mobility the first weeks. Discussed doing exercises within pain control and if pain increasing could decreased ROM, reps, and stop exercises as needed. Encouraged to perform quad sets and ankle pumps frequently for blood flow and to promote full knee extension.       Pertinent Vitals/Pain Pain Assessment: 0-10 Pain Score: 1  Pain Location: Rt knee Pain Descriptors / Indicators: Sore Pain Intervention(s): Limited activity within patient's tolerance;Monitored during session;Ice applied    Home Living                          Prior Function            PT Goals (current goals can now be found in the care plan section) Progress towards PT goals: Progressing toward goals    Frequency    7X/week      PT Plan Current plan remains appropriate    Co-evaluation              AM-PAC PT "6 Clicks" Mobility   Outcome Measure  Help needed turning from your back to your side while in a flat bed without using bedrails?: None Help needed moving from lying on your back to sitting on the side of a flat bed without using bedrails?: None Help needed moving to and from a bed to a chair (including a wheelchair)?: A Little Help needed standing up from a chair using your arms (e.g., wheelchair or bedside chair)?: A Little Help needed to walk in hospital room?: A Little Help needed climbing 3-5 steps with a railing? : A Little 6 Click Score: 20    End of Session Equipment Utilized During Treatment: Gait belt Activity Tolerance: Patient tolerated treatment well Patient left: with call bell/phone within reach;in bed (sitting EOB; alarm not needed (follows commands)) Nurse Communication: Mobility status PT Visit Diagnosis: Muscle weakness (generalized) (M62.81);Difficulty in walking, not elsewhere classified  (R26.2)     Time: 1856-3149 PT Time Calculation (min) (ACUTE ONLY): 27 min  Charges:  $Gait Training: 8-22 mins $Therapeutic Exercise: 8-22 mins                     Anise Salvo, PT Acute Rehab Services Pager [phone] [facility] [phone]    Rayetta Humphrey 02/20/2021, 11:54 AM

## 2021-02-20 NOTE — TOC Transition Note (Signed)
Transition of Care [facility]) - CM/SW Discharge Note   Patient Details  Name: Gary Ferguson MRN: [id number] Date of Birth: September 13, 1955  [facility] CM/SW Contact:  Lennart Pall, LCSW Phone Number: 02/20/2021, 9:39 AM   Clinical Narrative:    Met briefly with pt and confirming he has all needed DME at home.  Plan for OPPT at [facility] in [place].  No TOC needs.   Final next level of care: OP Rehab Barriers to Discharge: No Barriers Identified   Patient Goals and CMS Choice Patient states their goals for this hospitalization and ongoing recovery are:: return home      Discharge Placement                       Discharge Plan and Services                DME Arranged: N/A DME Agency: NA       HH Arranged: NA HH Agency: NA        Social Determinants of Health (SDOH) Interventions     Readmission Risk Interventions No flowsheet data found.

## 2021-02-20 NOTE — Plan of Care (Signed)

## 2021-02-20 NOTE — Progress Notes (Signed)
    Subjective:  Patient reports pain as mild.  Denies N/V/CP/SOB. No c/o. Wants to go home.  Objective:   VITALS:   Vitals:   02/20/21 0102 02/20/21 0558 02/20/21 0905 02/20/21 1233  BP: 106/68 97/66 102/68 113/87  Pulse: (!) 108 100 (!) 106 (!) 101  Resp: 16 20 18 18   Temp: 98.5 F (36.9 C) 98.4 F (36.9 C) 98.5 F (36.9 C) 98.4 F (36.9 C)  TempSrc: Oral Oral Oral Oral  SpO2: 94% 95% 96% 94%  Weight:      Height:        NAD ABD soft Sensation intact distally Intact pulses distally Dorsiflexion/Plantar flexion intact Incision: dressing C/D/I Compartment soft   Lab Results  Component Value Date   WBC 10.0 02/20/2021   HGB 11.6 (L) 02/20/2021   HCT 35.4 (L) 02/20/2021   MCV 93.2 02/20/2021   PLT 147 (L) 02/20/2021   BMET    Component Value Date/Time   NA 135 02/20/2021 0306   K 4.4 02/20/2021 0306   CL 105 02/20/2021 0306   CO2 23 02/20/2021 0306   GLUCOSE 149 (H) 02/20/2021 0306   BUN 19 02/20/2021 0306   CREATININE 1.22 02/20/2021 0306   CALCIUM 8.1 (L) 02/20/2021 0306   GFRNONAA >60 02/20/2021 0306     Assessment/Plan: 1 Day Post-Op   Principal Problem:   Degenerative joint disease of right knee Active Problems:   Osteoarthritis of right knee   WBAT with walker DVT ppx:  Eliquis , SCDs, TEDS PO pain control PT/OT Dispo: D/C home with OPPT    02/22/2021 Gary Ferguson 02/20/2021, 1:14 PM   02/22/2021, MD [phone] [facility] is now [facility]  [place] [address], [address], [place], [place] Phone: [phone] [email or web address] Facebook  [phone]

## 2021-02-20 NOTE — Discharge Summary (Signed)
Physician Discharge Summary  Patient ID: Gary Ferguson MRN: [id number] DOB/AGE: Sep 03, 1955 65 y.o.  Admit date: 02/19/2021 Discharge date: 02/20/2021  Admission Diagnoses:  Degenerative joint disease of right knee  Discharge Diagnoses:  Principal Problem:   Degenerative joint disease of right knee Active Problems:   Osteoarthritis of right knee   Past Medical History:  Diagnosis Date   Arthritis    Chronic back pain    Complication of anesthesia    Patient said that after surgery his throat closed up in 2011   GERD (gastroesophageal reflux disease)    History of DVT of lower extremity    History of kidney stones    Hx of blood clots 2010   Macular atrophy, retinal    bilateral   Torn rotator cuff    right shoulder    Surgeries: Procedure(s): COMPUTER ASSISTED TOTAL KNEE ARTHROPLASTY on 02/19/2021   Consultants (if any):   Discharged Condition: Improved  Hospital Course: Gary Ferguson is an 65 y.o. male who was admitted 02/19/2021 with a diagnosis of Degenerative joint disease of right knee and went to the operating room on 02/19/2021 and underwent the above named procedures.    He was given perioperative antibiotics:  Anti-infectives (From admission, onward)    Start     Dose/Rate Route Frequency Ordered Stop   02/19/21 2200  vancomycin (VANCOREADY) IVPB 1000 mg/200 mL        1,000 mg 200 mL/hr over 60 Minutes Intravenous Every 12 hours 02/19/21 1606 02/19/21 2256   02/19/21 2000  ciprofloxacin (CIPRO) tablet 500 mg        500 mg Oral 2 times daily 02/19/21 1606     02/19/21 0800  vancomycin (VANCOCIN) IVPB 1000 mg/200 mL premix        1,000 mg 200 mL/hr over 60 Minutes Intravenous On call to O.R. 02/19/21 0753 02/19/21 1115   02/19/21 0800  ceFAZolin (ANCEF) IVPB 2g/100 mL premix        2 g 200 mL/hr over 30 Minutes Intravenous On call to O.R. 02/19/21 0753 02/19/21 1118     .  He was given sequential compression devices, early ambulation, and Eliquis  for DVT prophylaxis.  He benefited maximally from the hospital stay and there were no complications.    Recent vital signs:  Vitals:   02/20/21 0905 02/20/21 1233  BP: 102/68 113/87  Pulse: (!) 106 (!) 101  Resp: 18 18  Temp: 98.5 F (36.9 C) 98.4 F (36.9 C)  SpO2: 96% 94%    Recent laboratory studies:  Lab Results  Component Value Date   HGB 11.6 (L) 02/20/2021   HGB 14.0 02/09/2021   HGB 8.6 (L) 11/12/2015   Lab Results  Component Value Date   WBC 10.0 02/20/2021   PLT 147 (L) 02/20/2021   Lab Results  Component Value Date   INR 1.0 02/09/2021   Lab Results  Component Value Date   NA 135 02/20/2021   K 4.4 02/20/2021   CL 105 02/20/2021   CO2 23 02/20/2021   BUN 19 02/20/2021   CREATININE 1.22 02/20/2021   GLUCOSE 149 (H) 02/20/2021     WEIGHT BEARING   Weight bearing as tolerated with assist device (walker, cane, etc) as directed, use it as long as suggested by your surgeon or therapist, typically at least 4-6 weeks.   EXERCISES  Results after joint replacement surgery are often greatly improved when you follow the exercise, range of motion and muscle strengthening exercises prescribed by  your doctor. Safety measures are also important to protect the joint from further injury. Any time any of these exercises cause you to have increased pain or swelling, decrease what you are doing until you are comfortable again and then slowly increase them. If you have problems or questions, call your caregiver or physical therapist for advice.   Rehabilitation is important following a joint replacement. After just a few days of immobilization, the muscles of the leg can become weakened and shrink (atrophy).  These exercises are designed to build up the tone and strength of the thigh and leg muscles and to improve motion. Often times heat used for twenty to thirty minutes before working out will loosen up your tissues and help with improving the range of motion but do not  use heat for the first two weeks following surgery (sometimes heat can increase post-operative swelling).   These exercises can be done on a training (exercise) mat, on the floor, on a table or on a bed. Use whatever works the best and is most comfortable for you.    Use music or television while you are exercising so that the exercises are a pleasant break in your day. This will make your life better with the exercises acting as a break in your routine that you can look forward to.   Perform all exercises about fifteen times, three times per day or as directed.  You should exercise both the operative leg and the other leg as well.  Exercises include:   Quad Sets - Tighten up the muscle on the front of the thigh (Quad) and hold for 5-10 seconds.   Straight Leg Raises - With your knee straight (if you were given a brace, keep it on), lift the leg to 60 degrees, hold for 3 seconds, and slowly lower the leg.  Perform this exercise against resistance later as your leg gets stronger.  Leg Slides: Lying on your back, slowly slide your foot toward your buttocks, bending your knee up off the floor (only go as far as is comfortable). Then slowly slide your foot back down until your leg is flat on the floor again.  Angel Wings: Lying on your back spread your legs to the side as far apart as you can without causing discomfort.  Hamstring Strength:  Lying on your back, push your heel against the floor with your leg straight by tightening up the muscles of your buttocks.  Repeat, but this time bend your knee to a comfortable angle, and push your heel against the floor.  You may put a pillow under the heel to make it more comfortable if necessary.   A rehabilitation program following joint replacement surgery can speed recovery and prevent re-injury in the future due to weakened muscles. Contact your doctor or a physical therapist for more information on knee rehabilitation.    CONSTIPATION  Constipation is  defined medically as fewer than three stools per week and severe constipation as less than one stool per week.  Even if you have a regular bowel pattern at home, your normal regimen is likely to be disrupted due to multiple reasons following surgery.  Combination of anesthesia, postoperative narcotics, change in appetite and fluid intake all can affect your bowels.   YOU MUST use at least one of the following options; they are listed in order of increasing strength to get the job done.  They are all available over the counter, and you may need to use some, POSSIBLY even all  of these options:    Drink plenty of fluids (prune juice may be helpful) and high fiber foods Colace 100 mg by mouth twice a day  Senokot for constipation as directed and as needed Dulcolax (bisacodyl), take with full glass of water  Miralax (polyethylene glycol) once or twice a day as needed.  If you have tried all these things and are unable to have a bowel movement in the first 3-4 days after surgery call either your surgeon or your primary doctor.    If you experience loose stools or diarrhea, hold the medications until you stool forms back up.  If your symptoms do not get better within 1 week or if they get worse, check with your doctor.  If you experience "the worst abdominal pain ever" or develop nausea or vomiting, please contact the office immediately for further recommendations for treatment.   ITCHING:  If you experience itching with your medications, try taking only a single pain pill, or even half a pain pill at a time.  You can also use Benadryl over the counter for itching or also to help with sleep.   TED HOSE STOCKINGS:  Use stockings on both legs until for at least 2 weeks or as directed by physician office. They may be removed at night for sleeping.  MEDICATIONS:  See your medication summary on the "After Visit Summary" that nursing will review with you.  You may have some home medications which will be placed  on hold until you complete the course of blood thinner medication.  It is important for you to complete the blood thinner medication as prescribed.  PRECAUTIONS:  If you experience chest pain or shortness of breath - call 911 immediately for transfer to the hospital emergency department.   If you develop a fever greater that 101 F, purulent drainage from wound, increased redness or drainage from wound, foul odor from the wound/dressing, or calf pain - CONTACT YOUR SURGEON.                                                   FOLLOW-UP APPOINTMENTS:  If you do not already have a post-op appointment, please call the office for an appointment to be seen by your surgeon.  Guidelines for how soon to be seen are listed in your "After Visit Summary", but are typically between 1-4 weeks after surgery.  OTHER INSTRUCTIONS:   Knee Replacement:  Do not place pillow under knee, focus on keeping the knee straight while resting. CPM instructions: 0-90 degrees, 2 hours in the morning, 2 hours in the afternoon, and 2 hours in the evening. Place foam block, curve side up under heel at all times except when in CPM or when walking.  DO NOT modify, tear, cut, or change the foam block in any way.   MAKE SURE YOU:  Understand these instructions.  Get help right away if you are not doing well or get worse.    Thank you for letting us be a part of your medical care team.  It is a privilege we respect greatly.  We hope these instructions will help you stay on track for a fast and full recovery!   Diagnostic Studies: DG Knee Right Port  Result Date: 02/19/2021 CLINICAL DATA:  Total knee replacement EXAM: PORTABLE RIGHT KNEE - 1-2 VIEW COMPARISON:  None. FINDINGS:  Total knee arthroplasty. Prosthetic components appear well position. Expected postsurgical change in the anterior knee. IMPRESSION: Total knee arthroplasty. Electronically Signed   By: Genevive Bi M.D.   On: 02/19/2021 14:04    Disposition: Discharge  disposition: 01-Home or Self Care       Discharge Instructions     Call MD / Call 911   Complete by: As directed    If you experience chest pain or shortness of breath, CALL 911 and be transported to the hospital emergency room.  If you develope a fever above 101 F, pus (white drainage) or increased drainage or redness at the wound, or calf pain, call your surgeon's office.   Constipation Prevention   Complete by: As directed    Drink plenty of fluids.  Prune juice may be helpful.  You may use a stool softener, such as Colace (over the counter) 100 mg twice a day.  Use MiraLax (over the counter) for constipation as needed.   Diet - low sodium heart healthy   Complete by: As directed    Do not put a pillow under the knee. Place it under the heel.   Complete by: As directed    Driving restrictions   Complete by: As directed    No driving for 6 weeks   Increase activity slowly as tolerated   Complete by: As directed    Lifting restrictions   Complete by: As directed    No lifting for 6 weeks   Post-operative opioid taper instructions:   Complete by: As directed    POST-OPERATIVE OPIOID TAPER INSTRUCTIONS: It is important to wean off of your opioid medication as soon as possible. If you do not need pain medication after your surgery it is ok to stop day one. Opioids include: Codeine, Hydrocodone(Norco, Vicodin), Oxycodone(Percocet, oxycontin) and hydromorphone amongst others.  Long term and even short term use of opiods can cause: Increased pain response Dependence Constipation Depression Respiratory depression And more.  Withdrawal symptoms can include Flu like symptoms Nausea, vomiting And more Techniques to manage these symptoms Hydrate well Eat regular healthy meals Stay active Use relaxation techniques(deep breathing, meditating, yoga) Do Not substitute Alcohol to help with tapering If you have been on opioids for less than two weeks and do not have pain than it is  ok to stop all together.  Plan to wean off of opioids This plan should start within one week post op of your joint replacement. Maintain the same interval or time between taking each dose and first decrease the dose.  Cut the total daily intake of opioids by one tablet each day Next start to increase the time between doses. The last dose that should be eliminated is the evening dose.      TED hose   Complete by: As directed    Use stockings (TED hose) for 2 weeks on both leg(s).  You may remove them at night for sleeping.        Follow-up Information     [name], MD. Go on 03/06/2021.   Specialty: Orthopedic Surgery Why: You are scheduled for a follow up appointment on 03-06-21 at 9:45 am. Contact information: [address] [place] [place] [address] [phone]                  Signed: Iline Oven Tresa Jolley 02/20/2021, 1:21 PM

## 2021-02-23 ENCOUNTER — Encounter (HOSPITAL_COMMUNITY): Payer: Self-pay | Admitting: Orthopedic Surgery

## 2021-02-23 DIAGNOSIS — R531 Weakness: Secondary | ICD-10-CM | POA: Diagnosis not present

## 2021-02-23 DIAGNOSIS — M25561 Pain in right knee: Secondary | ICD-10-CM | POA: Diagnosis not present

## 2021-02-23 DIAGNOSIS — R2689 Other abnormalities of gait and mobility: Secondary | ICD-10-CM | POA: Diagnosis not present

## 2021-02-23 DIAGNOSIS — M25661 Stiffness of right knee, not elsewhere classified: Secondary | ICD-10-CM | POA: Diagnosis not present

## 2021-02-23 DIAGNOSIS — Z96651 Presence of right artificial knee joint: Secondary | ICD-10-CM | POA: Diagnosis not present

## 2021-02-24 DIAGNOSIS — R2689 Other abnormalities of gait and mobility: Secondary | ICD-10-CM | POA: Diagnosis not present

## 2021-02-24 DIAGNOSIS — M25561 Pain in right knee: Secondary | ICD-10-CM | POA: Diagnosis not present

## 2021-02-24 DIAGNOSIS — M25661 Stiffness of right knee, not elsewhere classified: Secondary | ICD-10-CM | POA: Diagnosis not present

## 2021-02-24 DIAGNOSIS — R531 Weakness: Secondary | ICD-10-CM | POA: Diagnosis not present

## 2021-02-24 DIAGNOSIS — Z96651 Presence of right artificial knee joint: Secondary | ICD-10-CM | POA: Diagnosis not present

## 2021-03-02 DIAGNOSIS — Z96651 Presence of right artificial knee joint: Secondary | ICD-10-CM | POA: Diagnosis not present

## 2021-03-02 DIAGNOSIS — M25561 Pain in right knee: Secondary | ICD-10-CM | POA: Diagnosis not present

## 2021-03-02 DIAGNOSIS — M25661 Stiffness of right knee, not elsewhere classified: Secondary | ICD-10-CM | POA: Diagnosis not present

## 2021-03-02 DIAGNOSIS — R531 Weakness: Secondary | ICD-10-CM | POA: Diagnosis not present

## 2021-03-02 DIAGNOSIS — R2689 Other abnormalities of gait and mobility: Secondary | ICD-10-CM | POA: Diagnosis not present

## 2021-03-04 DIAGNOSIS — M25561 Pain in right knee: Secondary | ICD-10-CM | POA: Diagnosis not present

## 2021-03-04 DIAGNOSIS — M25661 Stiffness of right knee, not elsewhere classified: Secondary | ICD-10-CM | POA: Diagnosis not present

## 2021-03-04 DIAGNOSIS — R531 Weakness: Secondary | ICD-10-CM | POA: Diagnosis not present

## 2021-03-04 DIAGNOSIS — Z96651 Presence of right artificial knee joint: Secondary | ICD-10-CM | POA: Diagnosis not present

## 2021-03-04 DIAGNOSIS — R2689 Other abnormalities of gait and mobility: Secondary | ICD-10-CM | POA: Diagnosis not present

## 2021-03-06 DIAGNOSIS — Z96651 Presence of right artificial knee joint: Secondary | ICD-10-CM | POA: Diagnosis not present

## 2021-03-06 DIAGNOSIS — Z471 Aftercare following joint replacement surgery: Secondary | ICD-10-CM | POA: Diagnosis not present

## 2021-03-06 DIAGNOSIS — M25561 Pain in right knee: Secondary | ICD-10-CM | POA: Diagnosis not present

## 2021-03-06 DIAGNOSIS — R531 Weakness: Secondary | ICD-10-CM | POA: Diagnosis not present

## 2021-03-06 DIAGNOSIS — M25661 Stiffness of right knee, not elsewhere classified: Secondary | ICD-10-CM | POA: Diagnosis not present

## 2021-03-06 DIAGNOSIS — R2689 Other abnormalities of gait and mobility: Secondary | ICD-10-CM | POA: Diagnosis not present

## 2021-03-09 DIAGNOSIS — R531 Weakness: Secondary | ICD-10-CM | POA: Diagnosis not present

## 2021-03-09 DIAGNOSIS — M25661 Stiffness of right knee, not elsewhere classified: Secondary | ICD-10-CM | POA: Diagnosis not present

## 2021-03-09 DIAGNOSIS — R2689 Other abnormalities of gait and mobility: Secondary | ICD-10-CM | POA: Diagnosis not present

## 2021-03-09 DIAGNOSIS — M25561 Pain in right knee: Secondary | ICD-10-CM | POA: Diagnosis not present

## 2021-03-09 DIAGNOSIS — Z96651 Presence of right artificial knee joint: Secondary | ICD-10-CM | POA: Diagnosis not present

## 2021-03-11 DIAGNOSIS — Z96651 Presence of right artificial knee joint: Secondary | ICD-10-CM | POA: Diagnosis not present

## 2021-03-11 DIAGNOSIS — M25661 Stiffness of right knee, not elsewhere classified: Secondary | ICD-10-CM | POA: Diagnosis not present

## 2021-03-11 DIAGNOSIS — M25561 Pain in right knee: Secondary | ICD-10-CM | POA: Diagnosis not present

## 2021-03-11 DIAGNOSIS — R531 Weakness: Secondary | ICD-10-CM | POA: Diagnosis not present

## 2021-03-11 DIAGNOSIS — R2689 Other abnormalities of gait and mobility: Secondary | ICD-10-CM | POA: Diagnosis not present

## 2021-03-13 DIAGNOSIS — R531 Weakness: Secondary | ICD-10-CM | POA: Diagnosis not present

## 2021-03-13 DIAGNOSIS — M25661 Stiffness of right knee, not elsewhere classified: Secondary | ICD-10-CM | POA: Diagnosis not present

## 2021-03-13 DIAGNOSIS — M25561 Pain in right knee: Secondary | ICD-10-CM | POA: Diagnosis not present

## 2021-03-13 DIAGNOSIS — Z96651 Presence of right artificial knee joint: Secondary | ICD-10-CM | POA: Diagnosis not present

## 2021-03-13 DIAGNOSIS — R2689 Other abnormalities of gait and mobility: Secondary | ICD-10-CM | POA: Diagnosis not present

## 2021-03-16 DIAGNOSIS — M25661 Stiffness of right knee, not elsewhere classified: Secondary | ICD-10-CM | POA: Diagnosis not present

## 2021-03-16 DIAGNOSIS — R2689 Other abnormalities of gait and mobility: Secondary | ICD-10-CM | POA: Diagnosis not present

## 2021-03-16 DIAGNOSIS — M25561 Pain in right knee: Secondary | ICD-10-CM | POA: Diagnosis not present

## 2021-03-16 DIAGNOSIS — Z96651 Presence of right artificial knee joint: Secondary | ICD-10-CM | POA: Diagnosis not present

## 2021-03-16 DIAGNOSIS — R531 Weakness: Secondary | ICD-10-CM | POA: Diagnosis not present

## 2021-03-18 DIAGNOSIS — M25661 Stiffness of right knee, not elsewhere classified: Secondary | ICD-10-CM | POA: Diagnosis not present

## 2021-03-18 DIAGNOSIS — R531 Weakness: Secondary | ICD-10-CM | POA: Diagnosis not present

## 2021-03-18 DIAGNOSIS — R2689 Other abnormalities of gait and mobility: Secondary | ICD-10-CM | POA: Diagnosis not present

## 2021-03-18 DIAGNOSIS — M25561 Pain in right knee: Secondary | ICD-10-CM | POA: Diagnosis not present

## 2021-03-18 DIAGNOSIS — Z96651 Presence of right artificial knee joint: Secondary | ICD-10-CM | POA: Diagnosis not present

## 2021-03-24 DIAGNOSIS — R2689 Other abnormalities of gait and mobility: Secondary | ICD-10-CM | POA: Diagnosis not present

## 2021-03-24 DIAGNOSIS — M25561 Pain in right knee: Secondary | ICD-10-CM | POA: Diagnosis not present

## 2021-03-24 DIAGNOSIS — R531 Weakness: Secondary | ICD-10-CM | POA: Diagnosis not present

## 2021-03-24 DIAGNOSIS — Z96651 Presence of right artificial knee joint: Secondary | ICD-10-CM | POA: Diagnosis not present

## 2021-03-24 DIAGNOSIS — M25661 Stiffness of right knee, not elsewhere classified: Secondary | ICD-10-CM | POA: Diagnosis not present

## 2021-03-27 DIAGNOSIS — R531 Weakness: Secondary | ICD-10-CM | POA: Diagnosis not present

## 2021-03-27 DIAGNOSIS — M25561 Pain in right knee: Secondary | ICD-10-CM | POA: Diagnosis not present

## 2021-03-27 DIAGNOSIS — R2689 Other abnormalities of gait and mobility: Secondary | ICD-10-CM | POA: Diagnosis not present

## 2021-03-27 DIAGNOSIS — M25661 Stiffness of right knee, not elsewhere classified: Secondary | ICD-10-CM | POA: Diagnosis not present

## 2021-03-27 DIAGNOSIS — Z96651 Presence of right artificial knee joint: Secondary | ICD-10-CM | POA: Diagnosis not present

## 2021-04-03 DIAGNOSIS — Z471 Aftercare following joint replacement surgery: Secondary | ICD-10-CM | POA: Diagnosis not present

## 2021-04-03 DIAGNOSIS — Z96651 Presence of right artificial knee joint: Secondary | ICD-10-CM | POA: Diagnosis not present

## 2021-04-11 ENCOUNTER — Other Ambulatory Visit: Payer: Self-pay

## 2021-04-11 ENCOUNTER — Emergency Department (HOSPITAL_COMMUNITY): Payer: No Typology Code available for payment source

## 2021-04-11 ENCOUNTER — Inpatient Hospital Stay (HOSPITAL_COMMUNITY)
Admission: EM | Admit: 2021-04-11 | Discharge: 2021-04-18 | DRG: 287 | Disposition: A | Discharge status: Home / Self Care | Payer: No Typology Code available for payment source | Attending: Internal Medicine | Admitting: Internal Medicine

## 2021-04-11 ENCOUNTER — Encounter (HOSPITAL_COMMUNITY): Payer: Self-pay

## 2021-04-11 DIAGNOSIS — R918 Other nonspecific abnormal finding of lung field: Secondary | ICD-10-CM | POA: Diagnosis not present

## 2021-04-11 DIAGNOSIS — Z7901 Long term (current) use of anticoagulants: Secondary | ICD-10-CM

## 2021-04-11 DIAGNOSIS — Z791 Long term (current) use of non-steroidal anti-inflammatories (NSAID): Secondary | ICD-10-CM

## 2021-04-11 DIAGNOSIS — Z96642 Presence of left artificial hip joint: Secondary | ICD-10-CM | POA: Diagnosis present

## 2021-04-11 DIAGNOSIS — M549 Dorsalgia, unspecified: Secondary | ICD-10-CM | POA: Diagnosis not present

## 2021-04-11 DIAGNOSIS — Z20822 Contact with and (suspected) exposure to covid-19: Secondary | ICD-10-CM | POA: Diagnosis not present

## 2021-04-11 DIAGNOSIS — H548 Legal blindness, as defined in USA: Secondary | ICD-10-CM | POA: Diagnosis not present

## 2021-04-11 DIAGNOSIS — Z8249 Family history of ischemic heart disease and other diseases of the circulatory system: Secondary | ICD-10-CM

## 2021-04-11 DIAGNOSIS — T502X5A Adverse effect of carbonic-anhydrase inhibitors, benzothiadiazides and other diuretics, initial encounter: Secondary | ICD-10-CM | POA: Diagnosis not present

## 2021-04-11 DIAGNOSIS — Z880 Allergy status to penicillin: Secondary | ICD-10-CM | POA: Diagnosis not present

## 2021-04-11 DIAGNOSIS — M1990 Unspecified osteoarthritis, unspecified site: Secondary | ICD-10-CM | POA: Diagnosis not present

## 2021-04-11 DIAGNOSIS — R0602 Shortness of breath: Principal | ICD-10-CM | POA: Diagnosis present

## 2021-04-11 DIAGNOSIS — Z5181 Encounter for therapeutic drug level monitoring: Secondary | ICD-10-CM | POA: Diagnosis not present

## 2021-04-11 DIAGNOSIS — I509 Heart failure, unspecified: Secondary | ICD-10-CM | POA: Diagnosis not present

## 2021-04-11 DIAGNOSIS — J9811 Atelectasis: Secondary | ICD-10-CM | POA: Diagnosis not present

## 2021-04-11 DIAGNOSIS — Z96651 Presence of right artificial knee joint: Secondary | ICD-10-CM | POA: Diagnosis present

## 2021-04-11 DIAGNOSIS — J90 Pleural effusion, not elsewhere classified: Secondary | ICD-10-CM

## 2021-04-11 DIAGNOSIS — Z79899 Other long term (current) drug therapy: Secondary | ICD-10-CM

## 2021-04-11 DIAGNOSIS — K219 Gastro-esophageal reflux disease without esophagitis: Secondary | ICD-10-CM | POA: Diagnosis not present

## 2021-04-11 DIAGNOSIS — I428 Other cardiomyopathies: Secondary | ICD-10-CM | POA: Diagnosis not present

## 2021-04-11 DIAGNOSIS — Z86718 Personal history of other venous thrombosis and embolism: Secondary | ICD-10-CM

## 2021-04-11 DIAGNOSIS — G8929 Other chronic pain: Secondary | ICD-10-CM | POA: Diagnosis not present

## 2021-04-11 DIAGNOSIS — Z86711 Personal history of pulmonary embolism: Secondary | ICD-10-CM

## 2021-04-11 DIAGNOSIS — I2510 Atherosclerotic heart disease of native coronary artery without angina pectoris: Secondary | ICD-10-CM | POA: Diagnosis not present

## 2021-04-11 DIAGNOSIS — I5021 Acute systolic (congestive) heart failure: Secondary | ICD-10-CM

## 2021-04-11 DIAGNOSIS — N179 Acute kidney failure, unspecified: Secondary | ICD-10-CM | POA: Diagnosis not present

## 2021-04-11 DIAGNOSIS — T847XXD Infection and inflammatory reaction due to other internal orthopedic prosthetic devices, implants and grafts, subsequent encounter: Secondary | ICD-10-CM

## 2021-04-11 DIAGNOSIS — I5023 Acute on chronic systolic (congestive) heart failure: Secondary | ICD-10-CM | POA: Diagnosis not present

## 2021-04-11 DIAGNOSIS — R0609 Other forms of dyspnea: Secondary | ICD-10-CM | POA: Diagnosis not present

## 2021-04-11 DIAGNOSIS — Z48813 Encounter for surgical aftercare following surgery on the respiratory system: Secondary | ICD-10-CM | POA: Diagnosis not present

## 2021-04-11 DIAGNOSIS — R091 Pleurisy: Secondary | ICD-10-CM | POA: Diagnosis not present

## 2021-04-11 DIAGNOSIS — I517 Cardiomegaly: Secondary | ICD-10-CM | POA: Diagnosis not present

## 2021-04-11 LAB — BASIC METABOLIC PANEL
Anion gap: 9 (ref 5–15)
Anion gap: 8 (ref 5–15)
BUN: 23 mg/dL (ref 8–23)
BUN: 20 mg/dL (ref 8–23)
CO2: 24 mmol/L (ref 22–32)
CO2: 21 mmol/L — ABNORMAL LOW (ref 22–32)
Calcium: 8.7 mg/dL — ABNORMAL LOW (ref 8.9–10.3)
Calcium: 8.5 mg/dL — ABNORMAL LOW (ref 8.9–10.3)
Chloride: 107 mmol/L (ref 98–111)
Chloride: 111 mmol/L (ref 98–111)
Creatinine, Ser: 1.09 mg/dL (ref 0.61–1.24)
Creatinine, Ser: 1.13 mg/dL (ref 0.61–1.24)
GFR, Estimated: >60 mL/min (ref >60)
GFR, Estimated: >60 mL/min (ref >60)
Glucose, Bld: 126 mg/dL — ABNORMAL HIGH (ref 70–99)
Glucose, Bld: 136 mg/dL — ABNORMAL HIGH (ref 70–99)
Potassium: 3.9 mmol/L (ref 3.5–5.1)
Potassium: 3.5 mmol/L (ref 3.5–5.1)
Sodium: 140 mmol/L (ref 135–145)
Sodium: 140 mmol/L (ref 135–145)

## 2021-04-11 LAB — BRAIN NATRIURETIC PEPTIDE: B Natriuretic Peptide: 384 pg/mL — ABNORMAL HIGH (ref 0.0–100.0)

## 2021-04-11 LAB — RESP PANEL BY RT-PCR (FLU A&B, COVID) ARPGX2
Influenza A by PCR: NEGATIVE
Influenza B by PCR: NEGATIVE
SARS Coronavirus 2 by RT PCR: NEGATIVE

## 2021-04-11 LAB — CBC WITH DIFFERENTIAL/PLATELET
Abs Immature Granulocytes: 0.01 10*3/uL (ref 0.00–0.07)
Basophils Absolute: 0 10*3/uL (ref 0.0–0.1)
Basophils Relative: 1 %
Eosinophils Absolute: 0.1 10*3/uL (ref 0.0–0.5)
Eosinophils Relative: 2 %
HCT: 41 % (ref 39.0–52.0)
Hemoglobin: 13.3 g/dL (ref 13.0–17.0)
Immature Granulocytes: 0 %
Lymphocytes Relative: 13 %
Lymphs Abs: 0.8 10*3/uL (ref 0.7–4.0)
MCH: 28.9 pg (ref 26.0–34.0)
MCHC: 32.4 g/dL (ref 30.0–36.0)
MCV: 89.1 fL (ref 80.0–100.0)
Monocytes Absolute: 0.4 10*3/uL (ref 0.1–1.0)
Monocytes Relative: 5 %
Neutro Abs: 5.2 10*3/uL (ref 1.7–7.7)
Neutrophils Relative %: 79 %
Platelets: 220 10*3/uL (ref 150–400)
RBC: 4.6 MIL/uL (ref 4.22–5.81)
RDW: 15.1 % (ref 11.5–15.5)
WBC: 6.5 10*3/uL (ref 4.0–10.5)
nRBC: 0 % (ref 0.0–0.2)

## 2021-04-11 LAB — CBC
HCT: 39 % (ref 39.0–52.0)
Hemoglobin: 13.1 g/dL (ref 13.0–17.0)
MCH: 29 pg (ref 26.0–34.0)
MCHC: 33.6 g/dL (ref 30.0–36.0)
MCV: 86.3 fL (ref 80.0–100.0)
Platelets: 227 10*3/uL (ref 150–400)
RBC: 4.52 MIL/uL (ref 4.22–5.81)
RDW: 14.9 % (ref 11.5–15.5)
WBC: 5.9 10*3/uL (ref 4.0–10.5)
nRBC: 0 % (ref 0.0–0.2)

## 2021-04-11 LAB — TROPONIN I (HIGH SENSITIVITY)
Troponin I (High Sensitivity): 24 ng/L — ABNORMAL HIGH (ref <18)
Troponin I (High Sensitivity): 25 ng/L — ABNORMAL HIGH (ref <18)

## 2021-04-11 LAB — HEPATIC FUNCTION PANEL
ALT: 16 U/L (ref 0–44)
AST: 21 U/L (ref 15–41)
Albumin: 3.7 g/dL (ref 3.5–5.0)
Alkaline Phosphatase: 68 U/L (ref 38–126)
Bilirubin, Direct: 0.2 mg/dL (ref 0.0–0.2)
Indirect Bilirubin: 0.9 mg/dL (ref 0.3–0.9)
Total Bilirubin: 1.1 mg/dL (ref 0.3–1.2)
Total Protein: 6.2 g/dL — ABNORMAL LOW (ref 6.5–8.1)

## 2021-04-11 LAB — LACTATE DEHYDROGENASE: LDH: 155 U/L (ref 98–192)

## 2021-04-11 MED ORDER — ONDANSETRON HCL 4 MG PO TABS: 4.0000 mg | ORAL_TABLET | Freq: Four times a day (QID) | ORAL | Status: DC | PRN

## 2021-04-11 MED ORDER — ENOXAPARIN SODIUM 40 MG/0.4ML IJ SOSY: 40.0000 mg | PREFILLED_SYRINGE | INTRAMUSCULAR | Status: DC

## 2021-04-11 MED ORDER — ONDANSETRON HCL 4 MG/2ML IJ SOLN: 4.0000 mg | Freq: Four times a day (QID) | INTRAMUSCULAR | Status: DC | PRN

## 2021-04-11 MED ORDER — PANTOPRAZOLE SODIUM 40 MG PO TBEC
40.0000 mg | DELAYED_RELEASE_TABLET | Freq: Every day | ORAL | Status: DC
  Administered 2021-04-12 – 2021-04-18 (×7): 40 mg via ORAL
  Filled 2021-04-11 (×7): qty 1

## 2021-04-11 MED ORDER — ACETAMINOPHEN 650 MG RE SUPP: 650.0000 mg | Freq: Four times a day (QID) | RECTAL | Status: DC | PRN

## 2021-04-11 MED ORDER — POLYETHYLENE GLYCOL 3350 17 G PO PACK: 17.0000 g | PACK | Freq: Every day | ORAL | Status: DC | PRN

## 2021-04-11 MED ORDER — FUROSEMIDE 10 MG/ML IJ SOLN
40.0000 mg | Freq: Once | INTRAMUSCULAR | Status: AC
  Administered 2021-04-11: 40 mg via INTRAVENOUS
  Filled 2021-04-11: qty 4

## 2021-04-11 MED ORDER — MELOXICAM 7.5 MG PO TABS: 7.5000 mg | ORAL_TABLET | Freq: Every day | ORAL | Status: DC

## 2021-04-11 MED ORDER — CIPROFLOXACIN HCL 500 MG PO TABS
500.0000 mg | ORAL_TABLET | Freq: Two times a day (BID) | ORAL | Status: DC
  Administered 2021-04-11 – 2021-04-18 (×14): 500 mg via ORAL
  Filled 2021-04-11 (×14): qty 1

## 2021-04-11 MED ORDER — IOHEXOL 350 MG/ML SOLN
75.0000 mL | Freq: Once | INTRAVENOUS | Status: AC | PRN
  Administered 2021-04-11: 75 mL via INTRAVENOUS

## 2021-04-11 MED ORDER — ACETAMINOPHEN 325 MG PO TABS: 650.0000 mg | ORAL_TABLET | Freq: Four times a day (QID) | ORAL | Status: DC | PRN

## 2021-04-11 MED ORDER — ASPIRIN EC 81 MG PO TBEC
81.0000 mg | DELAYED_RELEASE_TABLET | Freq: Every day | ORAL | Status: DC
  Administered 2021-04-12 – 2021-04-18 (×7): 81 mg via ORAL
  Filled 2021-04-11 (×7): qty 1

## 2021-04-11 NOTE — ED Triage Notes (Signed)
Patient reports recent cough, congestion with worsening SHOB for the past few days.

## 2021-04-11 NOTE — H&P (Addendum)
History and Physical:    Gary Ferguson   JKD:[id number] DOB: 1955/08/26 DOA: 04/11/2021  Referring MD/provider: Chestine Spore, PA PCP: Elfredia Nevins, MD   Patient coming from: Home  Chief Complaint: Shortness of breath  History of Present Illness:   Gary Ferguson is a 66 y.o. male with medical history significant for recent right knee surgery, arthritis, chronic back pain, history of DVT about 12 years ago, GERD, kidney stones, who presented to the hospital because of cough and shortness of breath.  He said cough started about a week before Christmas last year and has progressively worsened.  He also developed shortness of breath about a week ago.  However shortness of breath has progressively worsened.  This morning, he felt so short of breath he could barely do anything.  This made him come to the emergency room for further evaluation.  No fever, chills, dizziness, palpitations, chest pain, vomiting, diarrhea, abdominal pain, abdominal distention or lower extremity swelling.  ED Course:  The patient was tachycardic and tachypneic in the emergency room.  He became more breathless when he tried to walk in the emergency room.  ROS:   ROS all other systems reviewed were negative  Past Medical History:   Past Medical History:  Diagnosis Date   Arthritis    Chronic back pain    Complication of anesthesia    Patient said that after surgery his throat closed up in 2011   GERD (gastroesophageal reflux disease)    History of DVT of lower extremity    History of kidney stones    Hx of blood clots 2010   Macular atrophy, retinal    bilateral   Torn rotator cuff    right shoulder    Past Surgical History:   Past Surgical History:  Procedure Laterality Date   BACK SURGERY  1997   BACK SURGERY  04/2009   3 separate surgies for infection    COLONOSCOPY  07/21/2011   Procedure: COLONOSCOPY;  Surgeon: [name], MD;  Location: [facility];  Service: Endoscopy;  Laterality:  N/A;  12:45 PM   KNEE ARTHROPLASTY Right 02/19/2021   Procedure: COMPUTER ASSISTED TOTAL KNEE ARTHROPLASTY;  Surgeon: [name], MD;  Location: [facility];  Service: Orthopedics;  Laterality: Right;   TONSILLECTOMY     TOTAL HIP ARTHROPLASTY Left 11/10/2015   TOTAL HIP ARTHROPLASTY Left 11/10/2015   Procedure: TOTAL LEFT HIP ARTHROPLASTY ANTERIOR APPROACH;  Surgeon: [name], MD;  Location: [facility];  Service: Orthopedics;  Laterality: Left;    Social History:   Social History   Socioeconomic History   Marital status: Divorced    Spouse name: Not on file   Number of children: Not on file   Years of education: Not on file   Highest education level: Not on file  Occupational History   Not on file  Tobacco Use   Smoking status: Never   Smokeless tobacco: Never  Vaping Use   Vaping Use: Never used  Substance and Sexual Activity   Alcohol use: No   Drug use: No   Sexual activity: Not on file  Other Topics Concern   Not on file  Social History Narrative   Not on file   Social Determinants of Health   Financial Resource Strain: Not on file  Food Insecurity: Not on file  Transportation Needs: Not on file  Physical Activity: Not on file  Stress: Not on file  Social Connections: Not on file  Intimate Partner  Violence: Not on file    Allergies   Penicillins  Family history:   Family History  Problem Relation Age of Onset   Liver disease Mother    Hypertension Father     Current Medications:   Prior to Admission medications   Medication Sig Start Date End Date Taking? Authorizing Provider  aspirin EC 81 MG tablet Take 81 mg by mouth daily. Swallow whole.   Yes [provider]  ciprofloxacin (CIPRO) 500 MG tablet Take 500 mg by mouth 2 (two) times daily.   Yes [provider]  clotrimazole-betamethasone (LOTRISONE) cream Apply 1 application topically 2 (two) times daily.   Yes [provider]  fexofenadine (ALLEGRA) 60 MG tablet Take  60 mg by mouth 2 (two) times daily.   Yes [provider]  meloxicam (MOBIC) 7.5 MG tablet Take 7.5 mg by mouth in the morning and at bedtime.   Yes [provider]  Multiple Vitamin (MULTIVITAMIN) capsule Take 1 capsule by mouth daily.   Yes [provider]  Omega-3 Fatty Acids (OMEGA 3 500 PO) Take 500 mg by mouth daily.   Yes [provider]  omeprazole (PRILOSEC) 20 MG capsule Take 20 mg by mouth every morning.    Yes [provider]  apixaban (ELIQUIS) 2.5 MG TABS tablet Take 1 tablet (2.5 mg total) by mouth every 12 (twelve) hours. Patient not taking: Reported on 04/11/2021 02/20/21   [name], MD  docusate sodium (COLACE) 100 MG capsule Take 1 capsule (100 mg total) by mouth 2 (two) times daily. Patient not taking: Reported on 04/11/2021 02/20/21   [name], MD  HYDROcodone-acetaminophen (NORCO) 7.5-325 MG tablet Take 1 tablet by mouth every 4 (four) hours as needed for severe pain (pain score 7-10). Patient not taking: Reported on 04/11/2021 02/20/21   [name], MD  ondansetron (ZOFRAN) 4 MG tablet Take 1 tablet (4 mg total) by mouth every 6 (six) hours as needed for nausea. Patient not taking: Reported on 04/11/2021 02/20/21   [name], MD  senna (SENOKOT) 8.6 MG TABS tablet Take 2 tablets (17.2 mg total) by mouth at bedtime. Patient not taking: Reported on 04/11/2021 02/20/21   [name], MD    Physical Exam:   Vitals:   04/11/21 1000 04/11/21 1100 04/11/21 1230 04/11/21 1400  BP: (!) 133/99 (!) 128/95 (!) 132/99 (!) 129/91  Pulse: (!) 110 (!) 103 (!) 108 (!) 105  Resp: 18 20 (!) 27 (!) 22  Temp:      TempSrc:      SpO2: 95% 95% 94% 94%  Weight:      Height:         Physical Exam: Blood pressure (!) 129/91, pulse (!) 105, temperature 97.9 F (36.6 C), temperature source Oral, resp. rate (!) 22, height 5\' 10"  (1.778 m), weight 90.7 kg, SpO2 94 %. Gen: No acute distress. Head: Normocephalic,  atraumatic. Eyes: Pupils equal, round and reactive to light. Extraocular movements intact.  Sclerae nonicteric.  Mouth: Moist mucous membranes Neck: Supple, no thyromegaly, no lymphadenopathy, no jugular venous distention. Chest: Decreased air entry at the lung bases, bibasilar rales CV: Heart sounds are regular with an S1, S2. No murmurs, rubs or gallops.  Abdomen: Soft, nontender, nondistended with normal active bowel sounds. No palpable masses. Extremities: Extremities are without clubbing, or cyanosis.  Mild chronic right leg edema from recent right knee surgery.  No erythema or tenderness. Skin: Warm and dry. No rashes, lesions or wounds Neuro: Alert and oriented  times 3; grossly nonfocal.  Psych: Insight is good and judgment is appropriate. Mood and affect normal.   Data Review:    Labs: Basic Metabolic Panel: Recent Labs  Lab 04/11/21 0916  NA 140  K 3.9  CL 107  CO2 24  GLUCOSE 126*  BUN 23  CREATININE 1.09  CALCIUM 8.7*   Liver Function Tests: No results for input(s): AST, ALT, ALKPHOS, BILITOT, PROT, ALBUMIN in the last 168 hours. No results for input(s): LIPASE, AMYLASE in the last 168 hours. No results for input(s): AMMONIA in the last 168 hours. CBC: Recent Labs  Lab 04/11/21 0916  WBC 6.5  NEUTROABS 5.2  HGB 13.3  HCT 41.0  MCV 89.1  PLT 220   Cardiac Enzymes: No results for input(s): CKTOTAL, CKMB, CKMBINDEX, TROPONINI in the last 168 hours.  BNP (last 3 results) No results for input(s): PROBNP in the last 8760 hours. CBG: No results for input(s): GLUCAP in the last 168 hours.  Urinalysis    Component Value Date/Time   COLORURINE AMBER (A) 11/18/2012 2002   APPEARANCEUR CLEAR 11/18/2012 2002   LABSPEC 1.020 11/18/2012 2002   PHURINE 6.0 11/18/2012 2002   GLUCOSEU NEGATIVE 11/18/2012 2002   HGBUR NEGATIVE 11/18/2012 2002   BILIRUBINUR NEGATIVE 11/18/2012 2002   KETONESUR NEGATIVE 11/18/2012 2002   PROTEINUR NEGATIVE 11/18/2012 2002    UROBILINOGEN 0.2 11/18/2012 2002   NITRITE NEGATIVE 11/18/2012 2002   LEUKOCYTESUR NEGATIVE 11/18/2012 2002      Radiographic Studies: CT Angio Chest PE W and/or Wo Contrast  Result Date: 04/11/2021 CLINICAL DATA:  Shortness of breath EXAM: CT ANGIOGRAPHY CHEST WITH CONTRAST TECHNIQUE: Multidetector CT imaging of the chest was performed using the standard protocol during bolus administration of intravenous contrast. Multiplanar CT image reconstructions and MIPs were obtained to evaluate the vascular anatomy. CONTRAST:  75mL OMNIPAQUE IOHEXOL 350 MG/ML SOLN COMPARISON:  None. FINDINGS: Cardiovascular: Satisfactory opacification of the pulmonary arteries to the segmental level. No evidence of pulmonary embolism. Heart is mildly enlarged. Coronary artery atherosclerotic calcifications. No pericardial effusion. Mediastinum/Nodes: No enlarged mediastinal, hilar, or axillary lymph nodes. Thyroid gland, trachea, and esophagus demonstrate no significant findings. Lungs/Pleura: Moderate-to-large bilateral pleural effusions with mild compressive atelectasis. No evidence of pneumonia or pulmonary edema. Upper Abdomen: No acute abnormality. Musculoskeletal: Partially imaged posterior spinal fusion at the thoracolumbar junction. No acute osseous abnormality. Review of the MIP images confirms the above findings. IMPRESSION: 1. No evidence of pulmonary embolism. 2. Moderate-to-large bilateral pleural effusions with compressive atelectasis. No evidence of pneumonia or pulmonary edema. 3. Mild cardiomegaly with coronary artery atherosclerotic disease. Electronically Signed   By: Larose Hires D.O.   On: 04/11/2021 10:59    EKG: Independently reviewed by me.  Sinus tachycardia   Assessment/Plan:   Principal Problem:   Pleural effusion, bilateral Active Problems:   Shortness of breath    Body mass index is 28.7 kg/m.  Bilateral pleural effusion with shortness of breath, sinus tachycardia and tachypnea: Admit  to progressive unit at [facility] long hospital.  Thoracentesis has been ordered for diagnostic and therapeutic purposes.  Obtain 2D echo for further evaluation.  Check troponins and BNP.  Unfortunately, interventional radiology service is not available on the weekend at [facility].  Patient is tolerating room air at this time.  Case was discussed with Dr. Rubin Payor, ED physician and [facility].  I requested thoracentesis prior to admission.  However, Dr. Rubin Payor said he is unable to perform thoracentesis and suggested that patient be transferred  to [facility].  Osteoarthritis, chronic back pain: Continue meloxicam  Other information:   DVT prophylaxis:   Lovenox  Code Status: Full code. Family Communication: Plan discussed with his father at the bedside Disposition Plan: Plan to discharge home in 2 to 3 days Consults called: None Admission status: Inpatient  The medical decision making on this patient was of high complexity and the patient is at high risk for clinical deterioration, therefore this is a level 3 visit.     Mayuri Staples [facility] Pager: Please check [email or web address]   How to contact the [facility] Attending or Consulting provider 7A - 7P or covering provider during after hours 7P -7A, for this patient?   Check the care team in [facility] and look for a) attending/consulting [facility] provider listed and b) the [facility] team listed Log into [email or web address] and use [place]'s universal password to access. If you do not have the password, please contact the hospital operator. Locate the [facility] provider you are looking for under [facility] and page to a number that you can be directly reached. If you still have difficulty reaching the provider, please page the [facility] (Director on Call) for the Hospitalists listed on amion for assistance.  04/11/2021, 3:55 PM

## 2021-04-11 NOTE — ED Notes (Signed)
Pt reports having SOB intermittently over past week which has worsened today. He was outside feeding animals and states he almost didn't make it back into the house. States he had a cough and congestion the week of christmas but this has somewhat improved. Reports generalized discomfort in chest with deep breaths at times. 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

## 2021-04-11 NOTE — ED Provider Notes (Signed)
[facility] EMERGENCY DEPARTMENT Provider Note   CSN: [id number] Arrival date & time: 04/11/21  0841     History  Chief Complaint  Patient presents with   Shortness of Breath    Gary Ferguson is a 66 y.o. male with a medical hx of DVT, who presents to the Emergency Department complaining of intermittent shortness of breath onset 1 week.  He notes that shortness of breath acutely worsened this morning.  Patient was outside feeding his animals and reported shortness of breath.  His shortness of breath is worsened with  exertion and alleviated with standing up. Pt had a right knee replacement 7 weeks ago. He had a DVT study completed prior with no evidence of DVT. Patient has associated dry cough, chest congestion.  Patient has a history of DVTs in his right leg in the past. He has tried otc medications with no relief of his symptoms. Denies chest pain, wheezing, cough, fever, chills, abdominal pain, nausea, vomiting, back pain.  Denies history of malignancy, unilateral leg swelling, recent immobilization, recent surgery, history of DVT/PE.  Denies anticoagulant use. Denies history of heart failure, asthma, COPD. He had an IVC filter placed over 10 years ago.    Past Medical History:  Diagnosis Date   Arthritis    Chronic back pain    Complication of anesthesia    Patient said that after surgery his throat closed up in 2011   GERD (gastroesophageal reflux disease)    History of DVT of lower extremity    History of kidney stones    Hx of blood clots 2010   Macular atrophy, retinal    bilateral   Torn rotator cuff    right shoulder     The history is provided by the patient. No language interpreter was used.  Shortness of Breath Onset quality:  Gradual Duration:  1 week Timing:  Intermittent Progression:  Unchanged Chronicity:  New Relieved by:  Sitting up Worsened by:  Exertion Ineffective treatments: OTC medications. Associated symptoms: cough   Associated symptoms: no  abdominal pain, no chest pain, no fever, no rash and no vomiting   Risk factors: hx of PE/DVT and recent surgery   Risk factors: no hx of cancer, no prolonged immobilization and no tobacco use       Home Medications Prior to Admission medications   Medication Sig Start Date End Date Taking? Authorizing Provider  apixaban (ELIQUIS) 2.5 MG TABS tablet Take 1 tablet (2.5 mg total) by mouth every 12 (twelve) hours. 02/20/21   Swinteck, Arlys John, MD  ciprofloxacin (CIPRO) 500 MG tablet Take 500 mg by mouth 2 (two) times daily.    [provider]  clotrimazole-betamethasone (LOTRISONE) cream Apply 1 application topically 2 (two) times daily.    [provider]  docusate sodium (COLACE) 100 MG capsule Take 1 capsule (100 mg total) by mouth 2 (two) times daily. 02/20/21   Swinteck, Arlys John, MD  fexofenadine (ALLEGRA) 60 MG tablet Take 60 mg by mouth 2 (two) times daily.    [provider]  HYDROcodone-acetaminophen (NORCO) 7.5-325 MG tablet Take 1 tablet by mouth every 4 (four) hours as needed for severe pain (pain score 7-10). 02/20/21   Swinteck, Arlys John, MD  meloxicam (MOBIC) 7.5 MG tablet Take 7.5 mg by mouth in the morning and at bedtime.    [provider]  Multiple Vitamin (MULTIVITAMIN) capsule Take 1 capsule by mouth daily.    [provider]  Omega-3 Fatty Acids (OMEGA 3 500 PO) Take  500 mg by mouth daily.    [provider]  omeprazole (PRILOSEC) 20 MG capsule Take 20 mg by mouth every morning.     [provider]  ondansetron (ZOFRAN) 4 MG tablet Take 1 tablet (4 mg total) by mouth every 6 (six) hours as needed for nausea. 02/20/21   Swinteck, Arlys John, MD  senna (SENOKOT) 8.6 MG TABS tablet Take 2 tablets (17.2 mg total) by mouth at bedtime. 02/20/21   Swinteck, Arlys John, MD      Allergies    Penicillins    Review of Systems   Review of Systems  Constitutional:  Negative for chills and fever.  HENT:  Negative for congestion and  rhinorrhea.   Respiratory:  Positive for cough, chest tightness and shortness of breath.   Cardiovascular:  Negative for chest pain.  Gastrointestinal:  Positive for nausea. Negative for abdominal pain and vomiting.  Musculoskeletal:  Negative for back pain.  Skin:  Negative for rash.  All other systems reviewed and are negative.  Physical Exam Updated Vital Signs BP (!) 134/113 (BP Location: Left Arm)    Pulse (!) 117    Temp 97.9 F (36.6 C) (Oral)    Resp (!) 26    Ht  (1.778 m)    Wt 90.7 kg    SpO2 96%    BMI 28.70 kg/m  Physical Exam Vitals and nursing note reviewed.  Constitutional:      General: He is not in acute distress.    Appearance: He is not diaphoretic.  HENT:     Head: Normocephalic and atraumatic.     Mouth/Throat:     Pharynx: No oropharyngeal exudate.  Eyes:     General: No scleral icterus.    Conjunctiva/sclera: Conjunctivae normal.  Cardiovascular:     Rate and Rhythm: Normal rate and regular rhythm.     Pulses: Normal pulses.     Heart sounds: Normal heart sounds.  Pulmonary:     Effort: Pulmonary effort is normal. No respiratory distress.     Breath sounds: Examination of the right-lower field reveals decreased breath sounds. Examination of the left-lower field reveals decreased breath sounds. Decreased breath sounds present. No wheezing.     Comments: Decreased breath sounds noted to bilateral lower lobes. Abdominal:     General: Bowel sounds are normal.     Palpations: Abdomen is soft. There is no mass.     Tenderness: There is no abdominal tenderness. There is no guarding or rebound.  Musculoskeletal:        General: Normal range of motion.     Cervical back: Normal range of motion and neck supple.     Comments: Strength and sensation intact to bilateral upper and lower extremities.  Skin:    General: Skin is warm and dry.  Neurological:     Mental Status: He is alert.  Psychiatric:        Behavior: Behavior normal.    ED Results /  Procedures / Treatments   Labs (all labs ordered are listed, but only abnormal results are displayed)  Labs Reviewed  BASIC METABOLIC PANEL - Abnormal; Notable for the following components:      Result Value   Glucose, Bld 126 (*)    Calcium 8.7 (*)    All other components within normal limits  BRAIN NATRIURETIC PEPTIDE - Abnormal; Notable for the following components:   B Natriuretic Peptide 384.0 (*)    All other components within normal limits  HEPATIC FUNCTION PANEL -  Abnormal; Notable for the following components:   Total Protein 6.2 (*)    All other components within normal limits  BASIC METABOLIC PANEL - Abnormal; Notable for the following components:   CO2 21 (*)    Glucose, Bld 136 (*)    Calcium 8.5 (*)    All other components within normal limits  TROPONIN I (HIGH SENSITIVITY) - Abnormal; Notable for the following components:   Troponin I (High Sensitivity) 24 (*)    All other components within normal limits  TROPONIN I (HIGH SENSITIVITY) - Abnormal; Notable for the following components:   Troponin I (High Sensitivity) 25 (*)    All other components within normal limits  RESP PANEL BY RT-PCR (FLU A&B, COVID) ARPGX2  MRSA NEXT GEN BY PCR, NASAL  CBC WITH DIFFERENTIAL/PLATELET  LACTATE DEHYDROGENASE  HIV ANTIBODY (ROUTINE TESTING W REFLEX)  CBC    EKG None  Radiology DG Chest 2 View  Result Date: 04/12/2021 CLINICAL DATA:  Pleural effusion.  Shortness of breath. EXAM: CHEST - 2 VIEW COMPARISON:  CT scan April 11, 2021 FINDINGS: The patient has known bilateral pleural effusions seen on the CT scan from yesterday persists but appear to be relatively small on today's imaging. The cardiomediastinal silhouette is unremarkable. No pneumothorax. No nodules or masses. No focal infiltrates. IMPRESSION: Bilateral pleural effusions as above, left greater than right. These are smaller than expected based on yesterday's CT scan. Electronically Signed   By: Gerome Sam III  M.D.   On: 04/12/2021 09:58   CT Angio Chest PE W and/or Wo Contrast  Result Date: 04/11/2021 CLINICAL DATA:  Shortness of breath EXAM: CT ANGIOGRAPHY CHEST WITH CONTRAST TECHNIQUE: Multidetector CT imaging of the chest was performed using the standard protocol during bolus administration of intravenous contrast. Multiplanar CT image reconstructions and MIPs were obtained to evaluate the vascular anatomy. CONTRAST:  75mL OMNIPAQUE IOHEXOL 350 MG/ML SOLN COMPARISON:  None. FINDINGS: Cardiovascular: Satisfactory opacification of the pulmonary arteries to the segmental level. No evidence of pulmonary embolism. Heart is mildly enlarged. Coronary artery atherosclerotic calcifications. No pericardial effusion. Mediastinum/Nodes: No enlarged mediastinal, hilar, or axillary lymph nodes. Thyroid gland, trachea, and esophagus demonstrate no significant findings. Lungs/Pleura: Moderate-to-large bilateral pleural effusions with mild compressive atelectasis. No evidence of pneumonia or pulmonary edema. Upper Abdomen: No acute abnormality. Musculoskeletal: Partially imaged posterior spinal fusion at the thoracolumbar junction. No acute osseous abnormality. Review of the MIP images confirms the above findings. IMPRESSION: 1. No evidence of pulmonary embolism. 2. Moderate-to-large bilateral pleural effusions with compressive atelectasis. No evidence of pneumonia or pulmonary edema. 3. Mild cardiomegaly with coronary artery atherosclerotic disease. Electronically Signed   By: Larose Hires D.O.   On: 04/11/2021 10:59    Procedures Procedures    Medications Ordered in ED Medications - No data to display  ED Course/ Medical Decision Making/ A&P Clinical Course as of 04/11/21 1525  Sat Apr 11, 2021  1105 Patient notified of lab and imaging findings. [SB]  1251 Pt ambulated with pulse ox and noted to be approximately ~140/112, hr 130s, 97%, short of breath and labored. [SB]  1252 Discussed with attending who agrees with  admission for thoracentesis at this time [SB]  1410 Attending spoke with IR team who noted that thoracentesis can be completed Monday. Will consult hospitalist for admission. [SB]  1423 Consult again with Dr. Myriam Forehand voicing concerns from Attending regarding non-emergent thoracentesis in the ED. Dr. Myriam Forehand again recommends thoracentesis in the ED. [SB]  1423 Consult with Dr. Myriam Forehand  who recommends thoracentesis of one lung in the ED prior to admission. Discussed case with Attending, who notes that at this time due to it not being emergent, we will forgo the thoracentesis in the ED. If thoracentesis requested by Hospitalist, then we can get the patient admitted to [facility].  [SB]  1502 Spoke with oncoming Attending, who at this time, doesn't recommend pursuing thoracentesis in the ED. Recommends admission and transfer to [facility]. Hospitalist notified via secure chat. [SB]  1524 Hospitalist Dr. Myriam Forehand agreeable to admission at this time. Pt notified and agreeable with treatment plan. Pt appears safe for admission at this time. [SB]    Clinical Course User Index [SB] Daniyal Tabor A, PA-C                           Medical Decision Making  Patient presents with intermittent shortness of breath over the past week that acutely worsened this morning.  Patient has a prior history of DVTs to his right lower extremity.  Patient with right knee replacement 7 weeks ago.  Patient had an IVC filter placed after his DVT. Vital signs, patient tachycardic, tachypneic, not hypoxic, afebrile. On exam patient with decreased breath sounds noted to bilateral lower lobes, otherwise no acute cardiovascular or abdominal exam findings.  Differential diagnosis includes COVID, flu, pneumonia, viral URI with cough, PE, PTX.  Additional history obtained:  External records from outside source obtained and reviewed including: DVT ultrasound study on 12/17/2020, negative for DVT at that time.  IVC filter was placed during hospital admission  (05/02/2009-05/12/2009) through [facility].  Right knee arthroplasty completed on 02/19/2021.  Labs:  I ordered, and personally interpreted labs.  The pertinent results include: COVID and flu swab negative. BMP unremarkable. CBC without leukocytosis, no evidence of anemia. Troponin, BNP, HIV antibody, LDH, hepatic function panel ordered during hospital admission.  Troponin likely slightly elevated in the setting of increased demand.  BNP likely elevated due to pleural effusion.   Imaging: I ordered imaging studies including CT PE study. I independently visualized and interpreted imaging which showed no acute PE, however moderate to large pleural effusions noted bilaterally. I agree with the radiologist interpretation  Cardiac Monitoring: The patient was maintained on a cardiac monitor.  I personally viewed and interpreted the cardiac monitored which showed an underlying rhythm of: Sinus tachycardia.   Consultations: I requested consultation with the hospitalist, Dr. Myriam Forehand,  and discussed lab and imaging findings as well as pertinent plan - they recommend: Admission to hospital.  Please see below ED course for remainder of consultation information.  Clinical Course as of 04/12/21 1013  Sat Apr 11, 2021  1105 Patient notified of lab and imaging findings. [SB]  1251 Pt ambulated with pulse ox and noted to be approximately ~140/112, hr 130s, 97%, short of breath and labored. [SB]  1252 Discussed with attending who agrees with admission for thoracentesis at this time [SB]  1410 Attending spoke with IR team who noted that thoracentesis can be completed Monday. Will consult hospitalist for admission. [SB]  1423 Consult again with Dr. Myriam Forehand voicing concerns from Attending regarding non-emergent thoracentesis in the ED. Dr. Myriam Forehand again recommends thoracentesis in the ED. [SB]  1423 Consult with Dr. Myriam Forehand who recommends thoracentesis of one lung in the ED prior to admission. Discussed case with Attending,  who notes that at this time due to it not being emergent, we will forgo the thoracentesis in the ED. If thoracentesis requested by Hospitalist,  then we can get the patient admitted to [facility].  [SB]  1502 Spoke with oncoming Attending, who at this time, doesn't recommend pursuing thoracentesis in the ED. Recommends admission and transfer to [facility]. Hospitalist notified via secure chat. [SB]  1524 Hospitalist Dr. Myriam Forehand agreeable to admission at this time. Pt notified and agreeable with treatment plan. Pt appears safe for admission at this time. [SB]    Clinical Course User Index [SB] Mahmoud Blazejewski A, PA-C    Disposition: Patient presentation suspicious for shortness of breath with exertion in the setting of moderate to large bilateral pleural effusions.  Patient without history of malignancy, is not a current cigarette smoker, no history of heart failure. After consideration of the diagnostic results and the patients response to treatment, I feel that the patent would benefit from admission to the hospital for bilateral thoracentesis.  Case discussed with attending who agrees with hospital admission for thoracentesis.  Hospital admission discussed with patient at bedside he was agreeable at this time.  Patient appears safe for admission.   This chart was dictated using voice recognition software, Dragon. Despite the best efforts of this provider to proofread and correct errors, errors may still occur which can change documentation meaning.  Final Clinical Impression(s) / ED Diagnoses Final diagnoses:  SOB (shortness of breath)    Rx / DC Orders ED Discharge Orders     None         Ronie Fleeger A, PA-C 04/12/21 1014    Benjiman Core, MD 04/13/21 [phone]

## 2021-04-11 NOTE — ED Notes (Signed)
Ambulated pt around nurses station, pt reports SOB and labored breathing noted during ambulation, however O2 sats remained 96% on room air. HR elevated to 130 during ambulation. PA notified.

## 2021-04-12 ENCOUNTER — Inpatient Hospital Stay (HOSPITAL_COMMUNITY): Payer: No Typology Code available for payment source

## 2021-04-12 DIAGNOSIS — R0609 Other forms of dyspnea: Secondary | ICD-10-CM | POA: Diagnosis not present

## 2021-04-12 DIAGNOSIS — R091 Pleurisy: Secondary | ICD-10-CM | POA: Diagnosis not present

## 2021-04-12 DIAGNOSIS — H548 Legal blindness, as defined in USA: Secondary | ICD-10-CM

## 2021-04-12 DIAGNOSIS — R918 Other nonspecific abnormal finding of lung field: Secondary | ICD-10-CM | POA: Diagnosis not present

## 2021-04-12 DIAGNOSIS — Z48813 Encounter for surgical aftercare following surgery on the respiratory system: Secondary | ICD-10-CM | POA: Diagnosis not present

## 2021-04-12 DIAGNOSIS — J90 Pleural effusion, not elsewhere classified: Secondary | ICD-10-CM | POA: Diagnosis not present

## 2021-04-12 DIAGNOSIS — T847XXD Infection and inflammatory reaction due to other internal orthopedic prosthetic devices, implants and grafts, subsequent encounter: Secondary | ICD-10-CM

## 2021-04-12 LAB — BODY FLUID CELL COUNT WITH DIFFERENTIAL
Eos, Fluid: 1 %
Lymphs, Fluid: 51 %
Monocyte-Macrophage-Serous Fluid: 39 % — ABNORMAL LOW (ref 50–90)
Neutrophil Count, Fluid: 9 % (ref 0–25)
Total Nucleated Cell Count, Fluid: 489 cu mm (ref 0–1000)

## 2021-04-12 LAB — ECHOCARDIOGRAM COMPLETE
Calc EF: 24.7 %
Height: 70 in
S' Lateral: 4.8 cm
Single Plane A2C EF: 27.6 %
Single Plane A4C EF: 20.4 %
Weight: 3254.4 oz

## 2021-04-12 LAB — LACTATE DEHYDROGENASE, PLEURAL OR PERITONEAL FLUID: LD, Fluid: 74 U/L — ABNORMAL HIGH (ref 3–23)

## 2021-04-12 LAB — HIV ANTIBODY (ROUTINE TESTING W REFLEX): HIV Screen 4th Generation wRfx: NONREACTIVE

## 2021-04-12 LAB — PROTEIN, PLEURAL OR PERITONEAL FLUID: Total protein, fluid: <3 g/dL

## 2021-04-12 LAB — GRAM STAIN

## 2021-04-12 LAB — MRSA NEXT GEN BY PCR, NASAL: MRSA by PCR Next Gen: NOT DETECTED

## 2021-04-12 MED ORDER — HYDROCODONE-ACETAMINOPHEN 7.5-325 MG PO TABS: 1.0000 | ORAL_TABLET | ORAL | Status: DC | PRN

## 2021-04-12 MED ORDER — MELOXICAM 7.5 MG PO TABS
7.5000 mg | ORAL_TABLET | Freq: Every day | ORAL | Status: DC
  Administered 2021-04-13 – 2021-04-15 (×3): 7.5 mg via ORAL
  Filled 2021-04-12 (×3): qty 1

## 2021-04-12 MED ORDER — FUROSEMIDE 10 MG/ML IJ SOLN: 40.0000 mg | Freq: Once | INTRAMUSCULAR | Status: DC

## 2021-04-12 MED ORDER — LIDOCAINE HCL (PF) 1 % IJ SOLN
INTRAMUSCULAR | Status: AC
  Filled 2021-04-12: qty 30

## 2021-04-12 MED ORDER — FUROSEMIDE 10 MG/ML IJ SOLN
20.0000 mg | Freq: Once | INTRAMUSCULAR | Status: AC
  Administered 2021-04-12: 20 mg via INTRAVENOUS
  Filled 2021-04-12: qty 2

## 2021-04-12 NOTE — Progress Notes (Signed)
Echo attempted. Patient not in room for test. Will attempt again later as time permits.

## 2021-04-12 NOTE — Procedures (Addendum)
PROCEDURE SUMMARY:  Successful image-guided right thoracentesis. Yielded 950 cc of  hazy yellow fluid. Pt tolerated procedure well. No immediate complications. EBL = trace   Specimen was sent for labs. CXR ordered.  Please see imaging section of Epic for full dictation.  Left pleural spaced visualized as well, no pleural effusion for safe approach found.  Lynann Bologna Illya Gienger PA-C 04/12/2021 11:53 AM

## 2021-04-12 NOTE — Progress Notes (Signed)
°  Echocardiogram 2D Echocardiogram has been performed.  Gary Ferguson 04/12/2021, 3:39 PM

## 2021-04-12 NOTE — Care Management (Signed)
°  Transition of Care [facility]) Screening Note   Patient Details  Name: Gary Ferguson Date of Birth: 07/30/1955   Transition of Care [facility]) CM/SW Contact:    Gala Lewandowsky, RN Phone Number: 04/12/2021, 10:09 AM    Transition of Care Department [facility]) has reviewed patient and no TOC needs have been identified at this time. We will continue to monitor patient advancement through interdisciplinary progression rounds. If new patient transition needs arise, please place a TOC consult.

## 2021-04-12 NOTE — Progress Notes (Signed)
Based on lights criteria appears to be transudate effusion -await echo  Incentive spirometry and home o2 ambulation study

## 2021-04-12 NOTE — Progress Notes (Signed)
Progress Note    Gary Ferguson  TKZ:[id number] DOB: [birth year]  DOA: 04/11/2021 PCP: Elfredia Nevins, MD    Brief Narrative:     Medical records reviewed and are as summarized below:  Gary Ferguson is an 66 y.o. male with medical history significant for recent right knee surgery, arthritis, chronic back pain, history of DVT about 12 years ago, GERD, kidney stones, who presented to the hospital because of cough and shortness of breath.  He said cough started about a week before Christmas last year and has progressively worsened.  He also developed shortness of breath about a week ago.  However shortness of breath has progressively worsened.  This morning, he felt so short of breath he could barely do anything.  Tx from [facility] to [facility] for thoracentesis.   Assessment/Plan:   Principal Problem:   Pleural effusion, bilateral Active Problems:   Shortness of breath   Legally blind   Wound infection complicating hardware, subsequent encounter   Bilateral pleural effusion with shortness of breath, sinus tachycardia and tachypnea:  -echo pending -IV lasix x 2 doses -x ray ordered 2 view to evaluate for fluid -Thoracentesis has been ordered for diagnostic and therapeutic purposes.  - Patient is tolerating room air at this time.  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  Disposition Plan: Status is: Inpatient  Remains inpatient appropriate because: needs echo, x ray to determine if thoracentesis needed         Medical Consultants:   IR for thoracentesis  Subjective:   Urinated a lot  yesterday in the ER  Objective:    Vitals:   04/11/21 2000 04/11/21 2056 04/12/21 0002 04/12/21 0519  BP: 117/82 (!) 122/96  113/84 118/87  Pulse: (!) 111 (!) 114 (!) 109   Resp: (!) 28 (!) 25  20  Temp: 98 F (36.7 C) 98 F (36.7 C) 97.6 F (36.4 C) 97.6 F (36.4 C)  TempSrc: Oral Oral Oral Oral  SpO2: 95% 96% 95% 94%  Weight:  92.9 kg  92.3 kg  Height:        Intake/Output Summary (Last 24 hours) at 04/12/2021 0809 Last data filed at 04/12/2021 0500 Gross per 24 hour  Intake 240 ml  Output 2170 ml  Net -1930 ml   Filed Weights   04/11/21 0848 04/11/21 2056 04/12/21 0519  Weight: 90.7 kg 92.9 kg 92.3 kg    Exam:  General: Appearance:     Overweight male in no acute distress     Lungs:     Diminished at bases respirations unlabored  Heart:    Tachycardic.   MS:   All extremities are intact.    Neurologic:   Awake, alert, oriented x 3     Data Reviewed:   I have personally reviewed following labs and imaging studies:  Labs: Labs show the following:   Basic Metabolic Panel: Recent Labs  Lab 04/11/21 0916 04/11/21 2133  NA 140 140  K 3.9 3.5  CL 107 111  CO2 24 21*  GLUCOSE 126* 136*  BUN 23 20  CREATININE 1.09 1.13  CALCIUM 8.7* 8.5*   GFR Estimated Creatinine Clearance: 74.4 mL/min (by C-G formula based on SCr of 1.13 mg/dL). Liver Function Tests:  Recent Labs  Lab 04/11/21 1536  AST 21  ALT 16  ALKPHOS 68  BILITOT 1.1  PROT 6.2*  ALBUMIN 3.7   No results for input(s): LIPASE, AMYLASE in the last 168 hours. No results for input(s): AMMONIA in the last 168 hours. Coagulation profile No results for input(s): INR, PROTIME in the last 168 hours.  CBC: Recent Labs  Lab 04/11/21 0916 04/11/21 2133  WBC 6.5 5.9  NEUTROABS 5.2  --   HGB 13.3 13.1  HCT 41.0 39.0  MCV 89.1 86.3  PLT 220 227   Cardiac Enzymes: No results for input(s): CKTOTAL, CKMB, CKMBINDEX, TROPONINI in the last 168 hours. BNP (last 3 results) No results for input(s): PROBNP in the last 8760 hours. CBG: No results for input(s): GLUCAP in the last 168 hours. D-Dimer: No results for input(s):  DDIMER in the last 72 hours. Hgb A1c: No results for input(s): HGBA1C in the last 72 hours. Lipid Profile: No results for input(s): CHOL, HDL, LDLCALC, TRIG, CHOLHDL, LDLDIRECT in the last 72 hours. Thyroid function studies: No results for input(s): TSH, T4TOTAL, T3FREE, THYROIDAB in the last 72 hours.  Invalid input(s): FREET3 Anemia work up: No results for input(s): VITAMINB12, FOLATE, FERRITIN, TIBC, IRON, RETICCTPCT in the last 72 hours. Sepsis Labs: Recent Labs  Lab 04/11/21 0916 04/11/21 2133  WBC 6.5 5.9    Microbiology Recent Results (from the past 240 hour(s))  Resp Panel by RT-PCR (Flu A&B, Covid) Nasopharyngeal Swab     Status: None   Collection Time: 04/11/21  9:13 AM   Specimen: Nasopharyngeal Swab; Nasopharyngeal(NP) swabs in vial transport medium  Result Value Ref Range Status   SARS Coronavirus 2 by RT PCR NEGATIVE NEGATIVE Final    Comment: (NOTE) SARS-CoV-2 target nucleic acids are NOT DETECTED.  The SARS-CoV-2 RNA is generally detectable in upper respiratory specimens during the acute phase of infection. The lowest concentration of SARS-CoV-2 viral copies this assay can detect is 138 copies/mL. A negative result does not preclude SARS-Cov-2 infection and should not be used as the sole basis for treatment or other patient management decisions. A negative result may occur with  improper specimen collection/handling, submission of specimen other than nasopharyngeal swab, presence of viral mutation(s) within the areas targeted by this assay, and inadequate number of viral copies(<138 copies/mL). A negative result must be combined with clinical observations, patient history, and epidemiological information. The expected result is Negative.  Fact Sheet for Patients:  BloggerCourse.com  Fact Sheet for Healthcare Providers:  SeriousBroker.it  This test is no t yet approved or cleared by the [place] FDA  and  has been authorized for detection and/or diagnosis of SARS-CoV-2 by FDA under an Emergency Use Authorization (EUA). This EUA will remain  in effect (meaning this test can be used) for the duration of the COVID-19 declaration under Section 564(b)(1) of the Act, 21 U.S.C.section 360bbb-3(b)(1), unless the authorization is terminated  or revoked sooner.       Influenza A by PCR NEGATIVE NEGATIVE Final   Influenza B by PCR NEGATIVE NEGATIVE Final    Comment: (NOTE) The Xpert Xpress SARS-CoV-2/FLU/RSV plus assay is intended as an aid in the diagnosis of influenza from Nasopharyngeal swab specimens and should not be used as a sole basis for treatment. Nasal washings and aspirates are unacceptable for Xpert Xpress SARS-CoV-2/FLU/RSV testing.  Fact Sheet for Patients: BloggerCourse.com  Fact Sheet for Healthcare Providers: SeriousBroker.it  This test is not yet approved or cleared by the [place] and  has been authorized for detection and/or diagnosis of SARS-CoV-2 by FDA under an Emergency Use Authorization (EUA). This EUA will remain in effect (meaning this test can be used) for the duration of the COVID-19 declaration under Section 564(b)(1) of the Act, 21 U.S.C. section 360bbb-3(b)(1), unless the authorization is terminated or revoked.  Performed at [facility], [address] [place], [place] [address]     Procedures and diagnostic studies:  CT Angio Chest PE W and/or Wo Contrast  Result Date: 04/11/2021 CLINICAL DATA:  Shortness of breath EXAM: CT ANGIOGRAPHY CHEST WITH CONTRAST TECHNIQUE: Multidetector CT imaging of the chest was performed using the standard protocol during bolus administration of intravenous contrast. Multiplanar CT image reconstructions and MIPs were obtained to evaluate the vascular anatomy. CONTRAST:  31mL OMNIPAQUE IOHEXOL 350 MG/ML SOLN COMPARISON:  None. FINDINGS: Cardiovascular: Satisfactory  opacification of the pulmonary arteries to the segmental level. No evidence of pulmonary embolism. Heart is mildly enlarged. Coronary artery atherosclerotic calcifications. No pericardial effusion. Mediastinum/Nodes: No enlarged mediastinal, hilar, or axillary lymph nodes. Thyroid gland, trachea, and esophagus demonstrate no significant findings. Lungs/Pleura: Moderate-to-large bilateral pleural effusions with mild compressive atelectasis. No evidence of pneumonia or pulmonary edema. Upper Abdomen: No acute abnormality. Musculoskeletal: Partially imaged posterior spinal fusion at the thoracolumbar junction. No acute osseous abnormality. Review of the MIP images confirms the above findings. IMPRESSION: 1. No evidence of pulmonary embolism. 2. Moderate-to-large bilateral pleural effusions with compressive atelectasis. No evidence of pneumonia or pulmonary edema. 3. Mild cardiomegaly with coronary artery atherosclerotic disease. Electronically Signed   By: Larose Hires D.O.   On: 04/11/2021 10:59    Medications:    aspirin EC  81 mg Oral Daily   ciprofloxacin  500 mg Oral BID   meloxicam  7.5 mg Oral Daily   pantoprazole  40 mg Oral Daily   Continuous Infusions:   LOS: 1 day   Joseph Art  [facility]   How to contact the [facility] Attending or Consulting provider 7A - 7P or covering provider during after hours 7P -7A, for this patient?  Check the care team in [facility] and look for a) attending/consulting [facility] provider listed and b) the [facility] team listed Log into [email or web address] and use [facility] universal password to access. If you do not have the password, please contact the hospital operator. Locate the [facility] provider you are looking for under [facility] and page to a number that you can be directly reached. If you still have difficulty reaching the provider, please page the [facility] (Director on Call) for the Hospitalists listed on amion for assistance.  04/12/2021, 8:09 AM

## 2021-04-13 ENCOUNTER — Encounter (HOSPITAL_COMMUNITY): Payer: Self-pay | Admitting: Internal Medicine

## 2021-04-13 DIAGNOSIS — J90 Pleural effusion, not elsewhere classified: Secondary | ICD-10-CM | POA: Diagnosis not present

## 2021-04-13 LAB — BASIC METABOLIC PANEL
Anion gap: 9 (ref 5–15)
BUN: 19 mg/dL (ref 8–23)
CO2: 24 mmol/L (ref 22–32)
Calcium: 8.7 mg/dL — ABNORMAL LOW (ref 8.9–10.3)
Chloride: 103 mmol/L (ref 98–111)
Creatinine, Ser: 1.19 mg/dL (ref 0.61–1.24)
GFR, Estimated: >60 mL/min (ref >60)
Glucose, Bld: 103 mg/dL — ABNORMAL HIGH (ref 70–99)
Potassium: 3.6 mmol/L (ref 3.5–5.1)
Sodium: 136 mmol/L (ref 135–145)

## 2021-04-13 MED ORDER — ENOXAPARIN SODIUM 40 MG/0.4ML IJ SOSY
40.0000 mg | PREFILLED_SYRINGE | INTRAMUSCULAR | Status: DC
  Filled 2021-04-13: qty 0.4

## 2021-04-13 NOTE — Progress Notes (Addendum)
Progress Note    Gary Ferguson  ZOX:[id number] DOB: 1955/12/27  DOA: 04/11/2021 PCP: Elfredia Nevins, MD    Brief Narrative:     Medical records reviewed and are as summarized below:  Gary Ferguson is an 66 y.o. male with medical history significant for recent right knee surgery, arthritis, chronic back pain, history of DVT about 12 years ago, GERD, kidney stones, who presented to the hospital because of cough and shortness of breath.  He said cough started about a week before Christmas last year and has progressively worsened.  He also developed shortness of breath about a week ago.  However shortness of breath has progressively worsened.  This morning, he felt so short of breath he could barely do anything.  Tx from [facility] to [facility] for thoracentesis.  04/13/2021: Patient was seen and examined at his bedside.  He denies having chest pain or shortness of breath while at rest.  2D echo result revealed LVEF less than 20%.  Cardiology consulted, Dr. Algie Coffer to assist with the management of new acute HFrEF.   Assessment/Plan:   Principal Problem:   Pleural effusion, bilateral Active Problems:   Shortness of breath   Legally blind   Wound infection complicating hardware, subsequent encounter   Bilateral pleural effusion likely cardiogenic, secondary to acute heart failure, status post right thoracentesis on 04/12/2021 by IR with 950 cc of fluid removed:  2D echo done on 04/12/2021 revealed LVEF less than 20%, regional wall motion abnormalities mild mitral valve regurgitation, global hypokinesis with akinesis of the apical septum/lateral/anterior walls. Received IV lasix x 2 doses on 04/12/2021. Personally reviewed chest x-ray done on 04/12/2021 showing left pleural effusion and cardiomegaly. Currently on room air with O2 saturation of 95%.  Newly diagnosed acute HFrEF less than 20% 2D echo done on 04/12/2021, with findings as stated above Currently euvolemic on exam Net I&O -1.9 L Is currently off  diuretics Continue strict I's and O's and daily weight Cardiology has been consulted, Dr. 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  Legal blindness Monitor for now   Family Communication/Anticipated D/C date and plan/Code Status   DVT prophylaxis: Subcu Lovenox daily. Code Status: Full Code.  Disposition Plan: Status is: Inpatient  Remains inpatient appropriate because: needs echo, x ray to determine if thoracentesis needed         Medical Consultants:   IR for thoracentesis Cardiology, Dr. Algie Coffer 04/13/2021, paged.    Objective:    Vitals:   04/13/21 0003 04/13/21 0431 04/13/21 0753 04/13/21 1103  BP: 109/79 103/74 (!) 123/91 118/80  Pulse: 89 96 (!) 108 100  Resp: Temp: 97.7 F (36.5 C) 97.9 F (36.6 C) (!) 97.4 F (36.3 C) 97.6 F (36.4 C)  TempSrc: Oral Oral Oral Oral  SpO2: 95% 91% 92% 95%  Weight:  91.5 kg    Height:        Intake/Output Summary (Last 24 hours) at 04/13/2021 1421 Last data filed at 04/13/2021 0900 Gross per 24 hour  Intake 240 ml  Output 240 ml  Net 0 ml   Filed Weights   04/11/21 2056 04/12/21 0519 04/13/21 0431  Weight: 92.9 kg 92.3 kg 91.5 kg    Exam:  General: Appearance:  Well-developed well-nourished in no acute distress.  He is alert and oriented x3. Overweight male in no acute distress  Lungs:   Mild rales at bases.  No wheezing noted.  Good inspiratory effort.  Heart:  Regular rate and rhythm no rubs or gallops.  MS: Mild right lower extremity edema, at the site of post right knee replacement.   Neurologic: Alert and awake.  Oriented x3. Psych: Mood is appropriate for condition and setting.     Data Reviewed:   I have personally reviewed following labs and imaging studies:  Labs: Labs show the following:   Basic Metabolic Panel: Recent Labs  Lab  04/11/21 0916 04/11/21 2133 04/13/21 0236  NA 140 140 136  K 3.9 3.5 3.6  CL 107 111 103  CO2 24 21* 24  GLUCOSE 126* 136* 103*  BUN 23 20 19   CREATININE 1.09 1.13 1.19  CALCIUM 8.7* 8.5* 8.7*   GFR Estimated Creatinine Clearance: 70.4 mL/min (by C-G formula based on SCr of 1.19 mg/dL). Liver Function Tests: Recent Labs  Lab 04/11/21 1536  AST 21  ALT 16  ALKPHOS 68  BILITOT 1.1  PROT 6.2*  ALBUMIN 3.7   No results for input(s): LIPASE, AMYLASE in the last 168 hours. No results for input(s): AMMONIA in the last 168 hours. Coagulation profile No results for input(s): INR, PROTIME in the last 168 hours.  CBC: Recent Labs  Lab 04/11/21 0916 04/11/21 2133  WBC 6.5 5.9  NEUTROABS 5.2  --   HGB 13.3 13.1  HCT 41.0 39.0  MCV 89.1 86.3  PLT 220 227   Cardiac Enzymes: No results for input(s): CKTOTAL, CKMB, CKMBINDEX, TROPONINI in the last 168 hours. BNP (last 3 results) No results for input(s): PROBNP in the last 8760 hours. CBG: No results for input(s): GLUCAP in the last 168 hours. D-Dimer: No results for input(s): DDIMER in the last 72 hours. Hgb A1c: No results for input(s): HGBA1C in the last 72 hours. Lipid Profile: No results for input(s): CHOL, HDL, LDLCALC, TRIG, CHOLHDL, LDLDIRECT in the last 72 hours. Thyroid function studies: No results for input(s): TSH, T4TOTAL, T3FREE, THYROIDAB in the last 72 hours.  Invalid input(s): FREET3 Anemia work up: No results for input(s): VITAMINB12, FOLATE, FERRITIN, TIBC, IRON, RETICCTPCT in the last 72 hours. Sepsis Labs: Recent Labs  Lab 04/11/21 0916 04/11/21 2133  WBC 6.5 5.9    Microbiology Recent Results (from the past 240 hour(s))  Resp Panel by RT-PCR (Flu A&B, Covid) Nasopharyngeal Swab     Status: None   Collection Time: 04/11/21  9:13 AM   Specimen: Nasopharyngeal Swab; Nasopharyngeal(NP) swabs in vial transport medium  Result Value Ref Range Status   SARS Coronavirus 2 by RT PCR NEGATIVE  NEGATIVE Final    Comment: (NOTE) SARS-CoV-2 target nucleic acids are NOT DETECTED.  The SARS-CoV-2 RNA is generally detectable in upper respiratory specimens during the acute phase of infection. The lowest concentration of SARS-CoV-2 viral copies this assay can detect is 138 copies/mL. A negative result does not preclude SARS-Cov-2 infection and should not be used as the sole basis for treatment or other patient management decisions. A negative result may occur with  improper specimen collection/handling, submission of specimen other than nasopharyngeal swab, presence of viral mutation(s) within the areas targeted by this assay, and inadequate number of viral copies(<138 copies/mL). A negative result must be combined with clinical observations, patient history, and epidemiological information. The expected result is Negative.  Fact Sheet for Patients:  06/09/21  Fact Sheet for Healthcare Providers:  BloggerCourse.com  This test is no t yet approved or cleared by the [employer]ious[employer]ker.it  States FDA and  has been authorized for detection and/or diagnosis of SARS-CoV-2 by FDA under an Emergency Use Authorization (EUA). This EUA will remain  in effect (meaning this test can be used) for the duration of the COVID-19 declaration under Section 564(b)(1) of the Act, 21 U.S.C.section 360bbb-3(b)(1), unless the authorization is terminated  or revoked sooner.       Influenza A by PCR NEGATIVE NEGATIVE Final   Influenza B by PCR NEGATIVE NEGATIVE Final    Comment: (NOTE) The Xpert Xpress SARS-CoV-2/FLU/RSV plus assay is intended as an aid in the diagnosis of influenza from Nasopharyngeal swab specimens and should not be used as a sole basis for treatment. Nasal washings and aspirates are unacceptable for Xpert Xpress SARS-CoV-2/FLU/RSV testing.  Fact Sheet for Patients: BloggerCourse.com  Fact Sheet for Healthcare  Providers: SeriousBroker.it  This test is not yet approved or cleared by the [place] FDA and has been authorized for detection and/or diagnosis of SARS-CoV-2 by FDA under an Emergency Use Authorization (EUA). This EUA will remain in effect (meaning this test can be used) for the duration of the COVID-19 declaration under Section 564(b)(1) of the Act, 21 U.S.C. section 360bbb-3(b)(1), unless the authorization is terminated or revoked.  Performed at [facility], [address] [place], [place] [address]   MRSA Next Gen by PCR, Nasal     Status: None   Collection Time: 04/12/21  5:10 AM   Specimen: Nasal Mucosa; Nasal Swab  Result Value Ref Range Status   MRSA by PCR Next Gen NOT DETECTED NOT DETECTED Final    Comment: (NOTE) The GeneXpert MRSA Assay (FDA approved for NASAL specimens only), is one component of a comprehensive MRSA colonization surveillance program. It is not intended to diagnose MRSA infection nor to guide or monitor treatment for MRSA infections. Test performance is not FDA approved in patients less than 74 years old. Performed at [facility], [address] [place], [place] [address]   Culture, body fluid w Gram Stain-bottle     Status: None (Preliminary result)   Collection Time: 04/12/21 11:57 AM   Specimen: Pleura  Result Value Ref Range Status   Specimen Description PLEURAL RIGHT LUNG  Final   Special Requests NONE  Final   Culture   Final    NO GROWTH < 24 HOURS Performed at [facility], [address], [place], [place] [address]    Report Status PENDING  Incomplete  Gram stain     Status: None   Collection Time: 04/12/21 11:57 AM   Specimen: Pleura  Result Value Ref Range Status   Specimen Description PLEURAL RIGHT LUNG  Final   Special Requests NONE  Final   Gram Stain   Final    WBC PRESENT,BOTH PMN AND MONONUCLEAR NO ORGANISMS SEEN CYTOSPIN SMEAR Performed at [facility], [address] [place], [place] [address]    Report Status 04/12/2021 FINAL  Final    Procedures and diagnostic studies:  DG Chest 1 View  Result Date: 04/12/2021 CLINICAL DATA:  Short of breath. Status post thoracentesis, 950 mL removed. EXAM: CHEST  1 VIEW COMPARISON:  04/12/2021 at 8:46 a.m.  CT, 04/11/2021. FINDINGS: No visualized right pleural effusion.  No pneumothorax. Persistent opacity at the left lung base consistent with a small effusion with associated atelectasis. Prominent vascular and interstitial markings, stable. IMPRESSION: 1. No residual right pleural effusion visualized. 2. No pneumothorax. 3. Persistent left lung base opacity consistent with a combination of  pleural fluid and atelectasis. Electronically Signed   By: Amie Portland M.D.   On: 04/12/2021 12:02   DG Chest 2 View  Result Date: 04/12/2021 CLINICAL DATA:  Pleural effusion.  Shortness of breath. EXAM: CHEST - 2 VIEW COMPARISON:  CT scan April 11, 2021 FINDINGS: The patient has known bilateral pleural effusions seen on the CT scan from yesterday persists but appear to be relatively small on today's imaging. The cardiomediastinal silhouette is unremarkable. No pneumothorax. No nodules or masses. No focal infiltrates. IMPRESSION: Bilateral pleural effusions as above, left greater than right. These are smaller than expected based on yesterday's CT scan. Electronically Signed   By: Gerome Sam III M.D.   On: 04/12/2021 09:58   ECHOCARDIOGRAM COMPLETE  Result Date: 04/12/2021    ECHOCARDIOGRAM REPORT   Patient Name:   Gary Ferguson Date of Exam: 04/12/2021 Medical Rec #:  [id number]      Height:       70.0 in Accession #:    [id number]     Weight:       203.4 lb Date of Birth:  01-18-1956       BSA:          2.102 m Patient Age:    65 years       BP:           118/87 mmHg Patient Gender: M              HR:           109 bpm. Exam Location:  Inpatient Procedure: 2D Echo, Cardiac Doppler and Color Doppler Indications:    Dyspnea  History:         Patient has no prior history of Echocardiogram examinations. Hx                 DVT. GERD.  Sonographer:    Ross Ludwig RDCS (AE) Referring Phys: [id number] Lurene Shadow IMPRESSIONS  1. Left ventricular ejection fraction, by estimation, is <20%. The left ventricle has severely decreased function. The left ventricle demonstrates regional wall motion abnormalities (see scoring diagram/findings for description). The left ventricular internal cavity size was mildly dilated. Indeterminate diastolic filling due to E-A fusion.  2. Right ventricular systolic function is normal. The right ventricular size is normal.  3. Left atrial size was mildly dilated.  4. The mitral valve is grossly normal. Mild mitral valve regurgitation. No evidence of mitral stenosis.  5. The aortic valve is tricuspid. Aortic valve regurgitation is not visualized. No aortic stenosis is present.  6. The inferior vena cava is dilated in size with >50% respiratory variability, suggesting right atrial pressure of 8 mmHg. Comparison(s): Prior images unable to be directly viewed, comparison made by report only. Per Care Everywhere, Echo 05/05/09: EF 45-50%, global hypokinesis with akinesis of apical septum/lateral/anterior walls. Conclusion(s)/Recommendation(s): Severely reduced LVEF. No prior in our system to compare. Wall motion abnormalities as noted. Findings communicated with Dr. Benjamine Mola. FINDINGS  Left Ventricle: Left ventricular ejection fraction, by estimation, is <20%. The left ventricle has severely decreased function. The left ventricle demonstrates regional wall motion abnormalities. The left ventricular internal cavity size was mildly dilated. There is no left ventricular hypertrophy. Indeterminate diastolic filling due to E-A fusion.  LV Wall Scoring: The entire septum and entire apex are akinetic. The anterior wall, antero-lateral wall, inferior wall, and posterior wall are hypokinetic. Right Ventricle: The right ventricular size is normal. No  increase in right ventricular wall thickness. Right ventricular systolic function  is normal. Left Atrium: Left atrial size was mildly dilated. Right Atrium: Right atrial size was not well visualized. Pericardium: There is no evidence of pericardial effusion. Presence of epicardial fat layer. Mitral Valve: The mitral valve is grossly normal. Mild mitral valve regurgitation. No evidence of mitral valve stenosis. MV peak gradient, 6.1 mmHg. The mean mitral valve gradient is 3.0 mmHg. Tricuspid Valve: The tricuspid valve is grossly normal. Tricuspid valve regurgitation is trivial. No evidence of tricuspid stenosis. Aortic Valve: The aortic valve is tricuspid. Aortic valve regurgitation is not visualized. No aortic stenosis is present. Pulmonic Valve: The pulmonic valve was not well visualized. Pulmonic valve regurgitation is not visualized. No evidence of pulmonic stenosis. Aorta: The aortic root, ascending aorta and aortic arch are all structurally normal, with no evidence of dilitation or obstruction. Venous: The inferior vena cava is dilated in size with greater than 50% respiratory variability, suggesting right atrial pressure of 8 mmHg. IAS/Shunts: The interatrial septum was not well visualized.  LEFT VENTRICLE PLAX 2D LVIDd:         5.00 cm LVIDs:         4.80 cm LV PW:         1.10 cm LV IVS:        1.10 cm LVOT diam:     2.40 cm LVOT Area:     4.52 cm  LV Volumes (MOD) LV vol d, MOD A2C: 268.0 ml LV vol d, MOD A4C: 225.0 ml LV vol s, MOD A2C: 194.0 ml LV vol s, MOD A4C: 179.0 ml LV SV MOD A2C:     74.0 ml LV SV MOD A4C:     225.0 ml LV SV MOD BP:      61.9 ml RIGHT VENTRICLE             IVC RV Basal diam:  3.40 cm     IVC diam: 2.10 cm RV S prime:     11.30 cm/s TAPSE (M-mode): 1.6 cm LEFT ATRIUM             Index LA diam:        3.60 cm 1.71 cm/m LA Vol (A2C):   48.2 ml 22.93 ml/m LA Vol (A4C):   58.1 ml 27.64 ml/m LA Biplane Vol: 56.3 ml 26.78 ml/m   AORTA Ao Root diam: 3.60 cm Ao Asc diam:  3.60 cm  MITRAL VALVE MV Peak grad: 6.1 mmHg  SHUNTS MV Mean grad: 3.0 mmHg  Systemic Diam: 2.40 cm MV Vmax:      1.23 m/s MV Vmean:     71.6 cm/s Jodelle Red MD Electronically signed by Jodelle Red MD Signature Date/Time: 04/12/2021/6:56:39 PM    Final    US THORACENTESIS ASP PLEURAL SPACE W/IMG GUIDE  Result Date: 04/12/2021 INDICATION: Shortness of breath, bilateral pleural effusions seen on chest x-ray. Request for therapeutic and diagnostic thoracentesis. EXAM: ULTRASOUND GUIDED RIGHT THORACENTESIS MEDICATIONS: 10 mL 1% lidocaine COMPLICATIONS: None immediate. PROCEDURE: An ultrasound guided thoracentesis was thoroughly discussed with the patient and questions answered. The benefits, risks, alternatives and complications were also discussed. The patient understands and wishes to proceed with the procedure. Written consent was obtained. Ultrasound was performed to localize and mark an adequate pocket of fluid in the right chest. The area was then prepped and draped in the normal sterile fashion. 1% Lidocaine was used for local anesthesia. Under ultrasound guidance a 19 gauge, 7-cm, Yueh catheter was introduced. Thoracentesis was performed. The catheter was removed and a dressing applied. FINDINGS:  A total of approximately 950 cc of hazy yellow fluid was removed. Samples were sent to the laboratory as requested by the clinical team. Post procedure chest X-ray reviewed, negative for pneumothorax. IMPRESSION: Successful ultrasound guided right thoracentesis yielding 950 cc of pleural fluid. Read by: Lawernce Ion, PA-C Electronically Signed   By: Judie Petit.  Shick M.D.   On: 04/12/2021 14:17    Medications:    aspirin EC  81 mg Oral Daily   ciprofloxacin  500 mg Oral BID   meloxicam  7.5 mg Oral Daily   pantoprazole  40 mg Oral Daily   Continuous Infusions:   LOS: 2 days   Darlin Drop  [facility]   How to contact the [facility] Attending or Consulting provider 7A - 7P or covering provider during  after hours 7P -7A, for this patient?  Check the care team in [facility] and look for a) attending/consulting [facility] provider listed and b) the [facility] team listed Log into [email or web address] and use [facility]'s universal password to access. If you do not have the password, please contact the hospital operator. Locate the [facility] provider you are looking for under [facility] and page to a number that you can be directly reached. If you still have difficulty reaching the provider, please page the [facility] (Director on Call) for the Hospitalists listed on amion for assistance.  04/13/2021, 2:21 PM

## 2021-04-13 NOTE — Progress Notes (Signed)
Heart Failure Nurse Navigator Progress Note  PCP: Elfredia Nevins, MD PCP-Cardiologist: none Admission Diagnosis: pleural effusion. NEW HFrEF Admitted from: home with grandson.   Presentation:   Gary Ferguson [facility]. Patient interactive with interview process. Sitting in recliner with legs down on room air, speaking in full sentences with no SOB. 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 Pt is legally blind, does not drive but does operate farm [employer], power tools, etc. 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  Pt states he takes medications as prescribed. Pt aware he will need to make dietary modifications to decrease sodium intake. Pt had RTK replacement 8 weeks ago, feels SOB has been increasing since around 03/29/21. Completed OP PT s/p knee sx, ambulated independently.  Explained benefits of Heart & Vascular Transitions of Care Clinic appointment, patient agreeable.    ECHO/ LVEF: <20%  Clinical Course:  Past Medical History:  Diagnosis Date   Arthritis    Chronic back pain    Complication of anesthesia    Patient said that after surgery his throat closed up in 2011   GERD (gastroesophageal reflux disease)    History of DVT of lower extremity    History of kidney stones    Hx of blood clots 2010   Macular atrophy, retinal    bilateral   Torn rotator cuff    right shoulder     Social History   Socioeconomic History   Marital status: Divorced    Spouse name: Not on file   Number of children: 3   Years of education: Not on file   Highest education level: Some college, no degree  Occupational History   Occupation: semi-retired    Comment: produce farmer  Tobacco Use   Smoking status: Never   Smokeless tobacco: Never  Vaping Use   Vaping Use: Never used  Substance and Sexual Activity   Alcohol use: No   Drug use: Never   Sexual activity: Not on file  Other  Topics Concern   Not on file  Social History Narrative   Not on file   Social Determinants of Health   Financial Resource Strain: Low Risk    Difficulty of Paying Living Expenses: Not hard at all  Food Insecurity: No Food Insecurity   Worried About Programme researcher, broadcasting/film/video in the Last Year: Never true   Ran Out of Food in the Last Year: Never true  Transportation Needs: No Transportation Needs   Lack of Transportation (Medical): No   Lack of Transportation (Non-Medical): No  Physical Activity: Not on file  Stress: Not on file  Social Connections: Not on file    High Risk Criteria for Readmission and/or Poor Patient Outcomes: Heart failure hospital admissions (last 6 months): 1  No Show rate: NA Difficult social situation: no Demonstrates medication adherence: yes Primary Language: English Literacy level: able to read/write and comprehend--NEEDS LARGE TEXT. PREFERABLY TEACHING WITH AUDIO. Legally blind.   Education Assessment and Provision:  Detailed education and instructions provided on heart failure disease management including the following:  Signs and symptoms of Heart Failure When to call the physician Importance of daily weights Low sodium diet Fluid restriction Medication management Anticipated future follow-up appointments  Patient education given on each of the above topics.  Patient acknowledges understanding via teach back method and acceptance of all instructions.  Education Materials:  "Living Better With Heart Failure" Booklet, HF  zone tool, & Daily Weight Tracker Tool.  Patient has scale at home: yes Patient has pill box at home: yes  Barriers of Care:   -legally blind -new HF Dx -dietary modifications -no cardiology  Considerations/Referrals:   Referral made to Heart Failure Pharmacist Stewardship: yes, appreciated Referral made to Heart Failure CSW/NCM TOC: yes, appreciated Referral made to Heart & Vascular TOC clinic: yes, 1/18 @ 11AM  Items for  Follow-up on DC/TOC: -optimize -cont HF education -sodium modification -medication cost -establish w cardiology  Ozella Rocks, MSN, RN Heart Failure Nurse Navigator [phone]

## 2021-04-14 ENCOUNTER — Other Ambulatory Visit (HOSPITAL_COMMUNITY): Payer: Self-pay

## 2021-04-14 DIAGNOSIS — I5021 Acute systolic (congestive) heart failure: Secondary | ICD-10-CM | POA: Diagnosis not present

## 2021-04-14 DIAGNOSIS — J90 Pleural effusion, not elsewhere classified: Secondary | ICD-10-CM | POA: Diagnosis not present

## 2021-04-14 LAB — PHOSPHORUS: Phosphorus: 4.3 mg/dL (ref 2.5–4.6)

## 2021-04-14 LAB — BASIC METABOLIC PANEL
Anion gap: 8 (ref 5–15)
BUN: 17 mg/dL (ref 8–23)
CO2: 23 mmol/L (ref 22–32)
Calcium: 8.6 mg/dL — ABNORMAL LOW (ref 8.9–10.3)
Chloride: 106 mmol/L (ref 98–111)
Creatinine, Ser: 1.09 mg/dL (ref 0.61–1.24)
GFR, Estimated: >60 mL/min (ref >60)
Glucose, Bld: 100 mg/dL — ABNORMAL HIGH (ref 70–99)
Potassium: 3.7 mmol/L (ref 3.5–5.1)
Sodium: 137 mmol/L (ref 135–145)

## 2021-04-14 LAB — CBC
HCT: 38.3 % — ABNORMAL LOW (ref 39.0–52.0)
Hemoglobin: 12.8 g/dL — ABNORMAL LOW (ref 13.0–17.0)
MCH: 28.8 pg (ref 26.0–34.0)
MCHC: 33.4 g/dL (ref 30.0–36.0)
MCV: 86.1 fL (ref 80.0–100.0)
Platelets: 184 10*3/uL (ref 150–400)
RBC: 4.45 MIL/uL (ref 4.22–5.81)
RDW: 14.7 % (ref 11.5–15.5)
WBC: 4.4 10*3/uL (ref 4.0–10.5)
nRBC: 0 % (ref 0.0–0.2)

## 2021-04-14 LAB — CYTOLOGY - NON PAP

## 2021-04-14 LAB — MAGNESIUM: Magnesium: 2 mg/dL (ref 1.7–2.4)

## 2021-04-14 MED ORDER — SODIUM CHLORIDE 0.9% FLUSH
3.0000 mL | Freq: Two times a day (BID) | INTRAVENOUS | Status: DC
  Administered 2021-04-14 – 2021-04-18 (×6): 3 mL via INTRAVENOUS

## 2021-04-14 MED ORDER — CARVEDILOL 3.125 MG PO TABS
3.1250 mg | ORAL_TABLET | Freq: Two times a day (BID) | ORAL | Status: DC
  Administered 2021-04-14 – 2021-04-18 (×6): 3.125 mg via ORAL
  Filled 2021-04-14 (×8): qty 1

## 2021-04-14 MED ORDER — FUROSEMIDE 20 MG PO TABS
20.0000 mg | ORAL_TABLET | Freq: Every day | ORAL | Status: DC
  Administered 2021-04-14: 20 mg via ORAL
  Filled 2021-04-14 (×2): qty 1

## 2021-04-14 MED ORDER — SACUBITRIL-VALSARTAN 24-26 MG PO TABS
1.0000 | ORAL_TABLET | Freq: Two times a day (BID) | ORAL | Status: DC
  Administered 2021-04-14: 1 via ORAL
  Filled 2021-04-14 (×2): qty 1

## 2021-04-14 NOTE — Progress Notes (Signed)
Progress Note    Gary Ferguson  ZOX:[id number] DOB: 07-12-1955  DOA: 04/11/2021 PCP: Elfredia Nevins, MD    Brief Narrative:     Medical records reviewed and are as summarized below:  Gary Ferguson is an 66 y.o. male with medical history significant for recent right knee surgery, arthritis, chronic back pain, history of DVT about 12 years ago, GERD, kidney stones, who presented to the hospital because of cough and shortness of breath.  He said cough started about a week before Christmas last year and has progressively worsened.  He also developed shortness of breath about a week ago.  However shortness of breath has progressively worsened.  This morning, he felt so short of breath he could barely do anything.  Tx from [facility] to [facility] for thoracentesis.  2D echo result revealed LVEF less than 20%.  Cardiology paged, Dr. Algie Coffer 04/13/2021, to assist with the management of new acute HFrEF.  The following day 04/14/2021, the writer paged [facility] for consultation.  Appreciate cardiology's assistance.   04/14/2021: Patient was seen and examined at his bedside.  He denies any chest pain or dyspnea with ambulation.  Not volume overload on exam.  Assessment/Plan:   Principal Problem:   Pleural effusion, bilateral Active Problems:   Shortness of breath   Legally blind   Wound infection complicating hardware, subsequent encounter   Acute systolic CHF (congestive heart failure) (HCC)   Bilateral pleural effusion likely cardiogenic, secondary to acute heart failure, status post right thoracentesis on 04/12/2021 by IR with 950 cc of fluid removed:  2D echo done on 04/12/2021 revealed LVEF less than 20%, regional wall motion abnormalities mild mitral valve regurgitation, global hypokinesis with akinesis of the apical septum/lateral/anterior walls. Received IV lasix x 2 doses on 04/12/2021. Personally reviewed chest x-ray done on 04/12/2021 showing left pleural effusion and cardiomegaly. Currently on room air with  O2 saturation of 95%.  Newly diagnosed acute HFrEF less than 20% 2D echo done on 04/12/2021, with findings as stated above Currently euvolemic on exam Net I&O -1.9 L>> net I&O -2.6 L since admission. Currently off diuretics Continue strict I's and O's and daily weight [facility] cardiology consulted on 04/14/2021, appreciate Dr. Fabio Bering assistance. Entresto added, low-dose oral Lasix and low-dose Coreg. Plan left and right heart cath on Tuesday, 04/16/2021.  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  Legal blindness Monitor for now  Post right total knee replacement No new issues. Monitor for now   Family Communication/Anticipated D/C date and plan/Code Status   DVT prophylaxis: Subcu Lovenox daily. Code Status: Full Code.  Disposition Plan: Status is: Inpatient  Remains inpatient appropriate because: needs echo, x ray to determine if thoracentesis needed         Medical Consultants:   IR for thoracentesis Cardiology, Dr. Algie Coffer 04/13/2021, paged and secure chat no answer. [facility] cardiology consulted on 04/14/2021.    Objective:    Vitals:   04/14/21 0016 04/14/21 0421 04/14/21 0724 04/14/21 1228  BP: 103/76 108/79 110/82 100/76  Pulse:  98 99   Resp:   20 20  Temp: 97.9 F (36.6 C) 97.7 F (36.5 C) 97.6 F (36.4 C) (!) 97.4 F (36.3 C)  TempSrc: Oral Oral Axillary Oral  SpO2:  96% 98%   Weight:  92.5 kg    Height:        Intake/Output Summary (Last 24  hours) at 04/14/2021 1331 Last data filed at 04/14/2021 0421 Gross per 24 hour  Intake 240 ml  Output 950 ml  Net -710 ml   Filed Weights   04/12/21 0519 04/13/21 0431 04/14/21 0421  Weight: 92.3 kg 91.5 kg 92.5 kg    Exam:  General: Appearance:  Well-developed well-nourished no acute distress.  He is alert and oriented x3.  Oriented x3. Overweight male in no acute distress      Lungs:   Clear to auscultation no wheezes or rales.  Good inspiratory effort.  Heart:  Regular rate and rhythm no rubs or gallops.  MS: Mild right lower extremity edema.   Neurologic: Alert and awake.  Nonfocal exam. Psych: Mood is appropriate for condition and setting.     Data Reviewed:   I have personally reviewed following labs and imaging studies:  Labs: Labs show the following:   Basic Metabolic Panel: Recent Labs  Lab 04/11/21 0916 04/11/21 2133 04/13/21 0236 04/14/21 0603  NA 140 140 136 137  K 3.9 3.5 3.6 3.7  CL 107 111 103 106  CO2 24 21* 24 23  GLUCOSE 126* 136* 103* 100*  BUN 23 20 19 17   CREATININE 1.09 1.13 1.19 1.09  CALCIUM 8.7* 8.5* 8.7* 8.6*  MG  --   --   --  2.0  PHOS  --   --   --  4.3   GFR Estimated Creatinine Clearance: 77.2 mL/min (by C-G formula based on SCr of 1.09 mg/dL). Liver Function Tests: Recent Labs  Lab 04/11/21 1536  AST 21  ALT 16  ALKPHOS 68  BILITOT 1.1  PROT 6.2*  ALBUMIN 3.7   No results for input(s): LIPASE, AMYLASE in the last 168 hours. No results for input(s): AMMONIA in the last 168 hours. Coagulation profile No results for input(s): INR, PROTIME in the last 168 hours.  CBC: Recent Labs  Lab 04/11/21 0916 04/11/21 2133 04/14/21 0603  WBC 6.5 5.9 4.4  NEUTROABS 5.2  --   --   HGB 13.3 13.1 12.8*  HCT 41.0 39.0 38.3*  MCV 89.1 86.3 86.1  PLT 220 227 184   Cardiac Enzymes: No results for input(s): CKTOTAL, CKMB, CKMBINDEX, TROPONINI in the last 168 hours. BNP (last 3 results) No results for input(s): PROBNP in the last 8760 hours. CBG: No results for input(s): GLUCAP in the last 168 hours. D-Dimer: No results for input(s): DDIMER in the last 72 hours. Hgb A1c: No results for input(s): HGBA1C in the last 72 hours. Lipid Profile: No results for input(s): CHOL, HDL, LDLCALC, TRIG, CHOLHDL, LDLDIRECT in the last 72 hours. Thyroid function studies: No results for input(s): TSH, T4TOTAL, T3FREE,  THYROIDAB in the last 72 hours.  Invalid input(s): FREET3 Anemia work up: No results for input(s): VITAMINB12, FOLATE, FERRITIN, TIBC, IRON, RETICCTPCT in the last 72 hours. Sepsis Labs: Recent Labs  Lab 04/11/21 0916 04/11/21 2133 04/14/21 0603  WBC 6.5 5.9 4.4    Microbiology Recent Results (from the past 240 hour(s))  Resp Panel by RT-PCR (Flu A&B, Covid) Nasopharyngeal Swab     Status: None   Collection Time: 04/11/21  9:13 AM   Specimen: Nasopharyngeal Swab; Nasopharyngeal(NP) swabs in vial transport medium  Result Value Ref Range Status   SARS Coronavirus 2 by RT PCR NEGATIVE NEGATIVE Final    Comment: (NOTE) SARS-CoV-2 target nucleic acids are NOT DETECTED.  The SARS-CoV-2 RNA is generally detectable in upper respiratory specimens during the acute phase of infection. The lowest concentration  of SARS-CoV-2 viral copies this assay can detect is 138 copies/mL. A negative result does not preclude SARS-Cov-2 infection and should not be used as the sole basis for treatment or other patient management decisions. A negative result may occur with  improper specimen collection/handling, submission of specimen other than nasopharyngeal swab, presence of viral mutation(s) within the areas targeted by this assay, and inadequate number of viral copies(<138 copies/mL). A negative result must be combined with clinical observations, patient history, and epidemiological information. The expected result is Negative.  Fact Sheet for Patients:  BloggerCourse.com  Fact Sheet for Healthcare Providers:  SeriousBroker.it  This test is no t yet approved or cleared by the [place] FDA and  has been authorized for detection and/or diagnosis of SARS-CoV-2 by FDA under an Emergency Use Authorization (EUA). This EUA will remain  in effect (meaning this test can be used) for the duration of the COVID-19 declaration under Section 564(b)(1) of  the Act, 21 U.S.C.section 360bbb-3(b)(1), unless the authorization is terminated  or revoked sooner.       Influenza A by PCR NEGATIVE NEGATIVE Final   Influenza B by PCR NEGATIVE NEGATIVE Final    Comment: (NOTE) The Xpert Xpress SARS-CoV-2/FLU/RSV plus assay is intended as an aid in the diagnosis of influenza from Nasopharyngeal swab specimens and should not be used as a sole basis for treatment. Nasal washings and aspirates are unacceptable for Xpert Xpress SARS-CoV-2/FLU/RSV testing.  Fact Sheet for Patients: BloggerCourse.com  Fact Sheet for Healthcare Providers: SeriousBroker.it  This test is not yet approved or cleared by the [place] FDA and has been authorized for detection and/or diagnosis of SARS-CoV-2 by FDA under an Emergency Use Authorization (EUA). This EUA will remain in effect (meaning this test can be used) for the duration of the COVID-19 declaration under Section 564(b)(1) of the Act, 21 U.S.C. section 360bbb-3(b)(1), unless the authorization is terminated or revoked.  Performed at [facility], [address], [place], [place] [address]   MRSA Next Gen by PCR, Nasal     Status: None   Collection Time: 04/12/21  5:10 AM   Specimen: Nasal Mucosa; Nasal Swab  Result Value Ref Range Status   MRSA by PCR Next Gen NOT DETECTED NOT DETECTED Final    Comment: (NOTE) The GeneXpert MRSA Assay (FDA approved for NASAL specimens only), is one component of a comprehensive MRSA colonization surveillance program. It is not intended to diagnose MRSA infection nor to guide or monitor treatment for MRSA infections. Test performance is not FDA approved in patients less than 20 years old. Performed at [facility], [address], [place], [place] [address]   Culture, body fluid w Gram Stain-bottle     Status: None (Preliminary result)   Collection Time: 04/12/21 11:57 AM   Specimen: Pleura  Result Value Ref  Range Status   Specimen Description PLEURAL RIGHT LUNG  Final   Special Requests NONE  Final   Culture   Final    NO GROWTH 2 DAYS Performed at [facility], [address] [place], [place] [address]    Report Status PENDING  Incomplete  Gram stain     Status: None   Collection Time: 04/12/21 11:57 AM   Specimen: Pleura  Result Value Ref Range Status   Specimen Description PLEURAL RIGHT LUNG  Final   Special Requests NONE  Final   Gram Stain   Final    WBC PRESENT,BOTH PMN AND MONONUCLEAR NO ORGANISMS SEEN CYTOSPIN SMEAR  Performed at [facility], [address] [place], [place] [address]    Report Status 04/12/2021 FINAL  Final    Procedures and diagnostic studies:  ECHOCARDIOGRAM COMPLETE  Result Date: 04/12/2021    ECHOCARDIOGRAM REPORT   Patient Name:   RUMALDO DIFATTA Date of Exam: 04/12/2021 Medical Rec #:  [id number]      Height:       70.0 in Accession #:    [id number]     Weight:       203.4 lb Date of Birth:  08/23/1955       BSA:          2.102 m Patient Age:    65 years       BP:           118/87 mmHg Patient Gender: M              HR:           109 bpm. Exam Location:  Inpatient Procedure: 2D Echo, Cardiac Doppler and Color Doppler Indications:    Dyspnea  History:        Patient has no prior history of Echocardiogram examinations. Hx                 DVT. GERD.  Sonographer:    Ross Ludwig RDCS (AE) Referring Phys: [id number] Lurene Shadow IMPRESSIONS  1. Left ventricular ejection fraction, by estimation, is <20%. The left ventricle has severely decreased function. The left ventricle demonstrates regional wall motion abnormalities (see scoring diagram/findings for description). The left ventricular internal cavity size was mildly dilated. Indeterminate diastolic filling due to E-A fusion.  2. Right ventricular systolic function is normal. The right ventricular size is normal.  3. Left atrial size was mildly dilated.  4. The mitral valve is grossly normal. Mild mitral valve  regurgitation. No evidence of mitral stenosis.  5. The aortic valve is tricuspid. Aortic valve regurgitation is not visualized. No aortic stenosis is present.  6. The inferior vena cava is dilated in size with >50% respiratory variability, suggesting right atrial pressure of 8 mmHg. Comparison(s): Prior images unable to be directly viewed, comparison made by report only. Per Care Everywhere, Echo 05/05/09: EF 45-50%, global hypokinesis with akinesis of apical septum/lateral/anterior walls. Conclusion(s)/Recommendation(s): Severely reduced LVEF. No prior in our system to compare. Wall motion abnormalities as noted. Findings communicated with Dr. Benjamine Mola. FINDINGS  Left Ventricle: Left ventricular ejection fraction, by estimation, is <20%. The left ventricle has severely decreased function. The left ventricle demonstrates regional wall motion abnormalities. The left ventricular internal cavity size was mildly dilated. There is no left ventricular hypertrophy. Indeterminate diastolic filling due to E-A fusion.  LV Wall Scoring: The entire septum and entire apex are akinetic. The anterior wall, antero-lateral wall, inferior wall, and posterior wall are hypokinetic. Right Ventricle: The right ventricular size is normal. No increase in right ventricular wall thickness. Right ventricular systolic function is normal. Left Atrium: Left atrial size was mildly dilated. Right Atrium: Right atrial size was not well visualized. Pericardium: There is no evidence of pericardial effusion. Presence of epicardial fat layer. Mitral Valve: The mitral valve is grossly normal. Mild mitral valve regurgitation. No evidence of mitral valve stenosis. MV peak gradient, 6.1 mmHg. The mean mitral valve gradient is 3.0 mmHg. Tricuspid Valve: The tricuspid valve is grossly normal. Tricuspid valve regurgitation is trivial. No evidence of tricuspid stenosis. Aortic Valve: The aortic valve is tricuspid. Aortic valve regurgitation is not visualized. No  aortic stenosis is present. Pulmonic Valve: The pulmonic valve was not well visualized. Pulmonic valve regurgitation is not visualized. No evidence of pulmonic stenosis. Aorta: The aortic root, ascending aorta and aortic arch are all structurally normal, with no evidence of dilitation or obstruction. Venous: The inferior vena cava is dilated in size with greater than 50% respiratory variability, suggesting right atrial pressure of 8 mmHg. IAS/Shunts: The interatrial septum was not well visualized.  LEFT VENTRICLE PLAX 2D LVIDd:         5.00 cm LVIDs:         4.80 cm LV PW:         1.10 cm LV IVS:        1.10 cm LVOT diam:     2.40 cm LVOT Area:     4.52 cm  LV Volumes (MOD) LV vol d, MOD A2C: 268.0 ml LV vol d, MOD A4C: 225.0 ml LV vol s, MOD A2C: 194.0 ml LV vol s, MOD A4C: 179.0 ml LV SV MOD A2C:     74.0 ml LV SV MOD A4C:     225.0 ml LV SV MOD BP:      61.9 ml RIGHT VENTRICLE             IVC RV Basal diam:  3.40 cm     IVC diam: 2.10 cm RV S prime:     11.30 cm/s TAPSE (M-mode): 1.6 cm LEFT ATRIUM             Index LA diam:        3.60 cm 1.71 cm/m LA Vol (A2C):   48.2 ml 22.93 ml/m LA Vol (A4C):   58.1 ml 27.64 ml/m LA Biplane Vol: 56.3 ml 26.78 ml/m   AORTA Ao Root diam: 3.60 cm Ao Asc diam:  3.60 cm MITRAL VALVE MV Peak grad: 6.1 mmHg  SHUNTS MV Mean grad: 3.0 mmHg  Systemic Diam: 2.40 cm MV Vmax:      1.23 m/s MV Vmean:     71.6 cm/s Jodelle Red MD Electronically signed by Jodelle Red MD Signature Date/Time: 04/12/2021/6:56:39 PM    Final     Medications:    aspirin EC  81 mg Oral Daily   carvedilol  3.125 mg Oral BID WC   ciprofloxacin  500 mg Oral BID   enoxaparin (LOVENOX) injection  40 mg Subcutaneous Q24H   furosemide  20 mg Oral Daily   meloxicam  7.5 mg Oral Daily   pantoprazole  40 mg Oral Daily   sacubitril-valsartan  1 tablet Oral BID   sodium chloride flush  3 mL Intravenous Q12H   Continuous Infusions:   LOS: 3 days   Darlin Drop  [facility]   How to contact the [facility] Attending or Consulting provider 7A - 7P or covering provider during after hours 7P -7A, for this patient?  Check the care team in [facility] and look for a) attending/consulting [facility] provider listed and b) the [facility] team listed Log into [email or web address] and use [place]'s universal password to access. If you do not have the password, please contact the hospital operator. Locate the [facility] provider you are looking for under [facility] and page to a number that you can be directly reached. If you still have difficulty reaching the provider, please page the [facility] (Director on Call) for the Hospitalists listed on amion for assistance.  04/14/2021, 1:31 PM

## 2021-04-14 NOTE — Plan of Care (Signed)
  Problem: Education: Goal: Ability to demonstrate management of disease process will improve Outcome: Progressing   Problem: Activity: Goal: Capacity to carry out activities will improve Outcome: Progressing   

## 2021-04-14 NOTE — Progress Notes (Signed)
Heart Failure Stewardship Pharmacist Progress Note   PCP: Elfredia Nevins, MD PCP-Cardiologist: None    HPI:  66 yo M with PMH of DVT, arthritis, GERD, and recent RKA. He presented to the ED on 04/11/21 with shortness of breath, cough, and congestion. CXR significant for bilateral pleural effusions. An ECHO was done on 04/12/21 and LVEF <20%. R/LHC scheduled for 04/16/21.  Current HF Medications: Diuretic: furosemide 20 mg PO daily Beta Blocker: carvedilol 3.125 mg BID ACE/ARB/ARNI: Entresto 24/26 mg BID  Prior to admission HF Medications: None  Pertinent Lab Values: Serum creatinine 1.09, BUN 17, Potassium 3.7, Sodium 137, BNP 384, Magnesium 2.0  Vital Signs: Weight: 203 lbs (admission weight: 204 lbs) Blood pressure: 110/80s  Heart rate: 100s  I/O: - yesterday; net -2.6L  Medication Assistance / Insurance Benefits Check: Does the patient have prescription insurance?  Yes Type of insurance plan: Medicare  Outpatient Pharmacy:  Prior to admission [facility] Is the patient willing to use [facility] TOC pharmacy at discharge? Yes Is the patient willing to transition their outpatient pharmacy to utilize a [facility] outpatient pharmacy?   Pending    Assessment: 1. Acute on chronic systolic CHF (EF <40%). NYHA class II symptoms. - Continue furosemide 20 mg PO daily - Agree with starting carvedilol 3.125 mg BID - Agree with starting Entresto 24/26 mg BID - Consider spironolactone and SGLT2i post cath. Would check A1c prior to starting SGLT2i.   Plan: 1) Medication changes recommended at this time: - Check A1c for SGLT2i  2) Patient assistance: - Entresto copay $10.35 - Farxiga/Jardiance copay $10.35  3)  Education  - To be completed prior to discharge  Sharen Hones, PharmD, BCPS Heart Failure Stewardship Pharmacist Phone [phone]

## 2021-04-14 NOTE — Progress Notes (Signed)
Held [place] for BP 88/61. MD notified

## 2021-04-14 NOTE — Progress Notes (Signed)
Pt c/o of dizziness while sitting in chair. BP 87/60. Pt back in bed laying down asymptomatic. Vin Bhagat PA notified. Stated to speak with fellow before entresto given. Cont to monitor. Emelda Brothers RN

## 2021-04-14 NOTE — Consult Note (Signed)
CARDIOLOGY CONSULT NOTE       Patient ID: CID [name] MRN: [id number] DOB/AGE: December 12, 1955 66 y.o.  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 History of Cipro for chronic lumbar appliance infection, DVT with IVC filter, and legally blind. Non smoker non drinker with no history of heart issues. Has had progressive dyspnea over the last few weeks. No chest pain palpitations or pre syncope TTE done yesterday shows EF <20% with mild MR and normal RV  He had a right thoracentesis with 950 cc CTA negative for PE. Troponin negative and BNP 384  He had a right TKR about 8 weeks ago with no complications and has rehabed well   ROS All other systems reviewed and negative except as noted above  Past Medical History:  Diagnosis Date   Arthritis    Chronic back pain    Complication of anesthesia    Patient said that after surgery his throat closed up in 2011   GERD (gastroesophageal reflux disease)    History of DVT of lower extremity    History of kidney stones    Hx of blood clots 2010   Macular atrophy, retinal    bilateral   Torn rotator cuff    right shoulder    Family History  Problem Relation Age of Onset   Liver disease Mother    Hypertension Father     Social History   Socioeconomic History   Marital status: Divorced    Spouse name: Not on file   Number of children: 3   Years of education: Not on file   Highest education level: Some college, no degree  Occupational History   Occupation: semi-retired    Comment: produce farmer  Tobacco Use   Smoking status:  Never   Smokeless tobacco: Never  Vaping Use   Vaping Use: Never used  Substance and Sexual Activity   Alcohol use: No   Drug use: Never   Sexual activity: Not on file  Other Topics Concern   Not on file  Social History Narrative   Not on file   Social Determinants of Health   Financial Resource Strain: Low Risk    Difficulty of Paying Living Expenses: Not hard at all  Food Insecurity: No Food Insecurity   Worried About Programme researcher, broadcasting/film/video in the Last Year: Never true   Ran Out of Food in the Last Year: Never true  Transportation Needs: No Transportation Needs   Lack of Transportation (Medical): No   Lack of Transportation (Non-Medical): No  Physical Activity: Not on file  Stress: Not on file  Social Connections: Not on file  Intimate Partner Violence: Not on file    Past Surgical History:  Procedure Laterality Date   BACK SURGERY  1997   BACK SURGERY  04/2009   3 separate surgies for infection    COLONOSCOPY  07/21/2011   Procedure: COLONOSCOPY;  Surgeon: [name], MD;  Location: [facility];  Service: Endoscopy;  Laterality: N/A;  12:45 PM   KNEE ARTHROPLASTY Right 02/19/2021   Procedure: COMPUTER ASSISTED TOTAL KNEE ARTHROPLASTY;  Surgeon: [name], MD;  Location: [facility];  Service: Orthopedics;  Laterality: Right;   TONSILLECTOMY     TOTAL HIP ARTHROPLASTY Left 11/10/2015   TOTAL HIP ARTHROPLASTY Left 11/10/2015   Procedure: TOTAL LEFT HIP ARTHROPLASTY ANTERIOR APPROACH;  Surgeon: [name], MD;  Location: [facility];  Service: Orthopedics;  Laterality: Left;      Current Facility-Administered Medications:    acetaminophen (TYLENOL) tablet 650 mg, 650 mg, Oral, Q6H PRN **OR** acetaminophen (TYLENOL) suppository 650 mg, 650 mg, Rectal, Q6H PRN, Lurene Shadow, MD   aspirin EC tablet 81 mg, 81 mg, Oral, Daily, Lurene Shadow, MD, 81 mg at 04/14/21 0854   carvedilol (COREG) tablet 3.125 mg, 3.125 mg, Oral, BID WC, Wendall Stade, MD   ciprofloxacin (CIPRO)  tablet 500 mg, 500 mg, Oral, BID, Carollee Herter, DO, 500 mg at 04/14/21 0854   enoxaparin (LOVENOX) injection 40 mg, 40 mg, Subcutaneous, Q24H, Hall, Carole N, DO   furosemide (LASIX) tablet 20 mg, 20 mg, Oral, Daily, Wendall Stade, MD   HYDROcodone-acetaminophen (NORCO) 7.5-325 MG per tablet 1 tablet, 1 tablet, Oral, Q4H PRN, Vann, Jessica U, DO   meloxicam (MOBIC) tablet 7.5 mg, 7.5 mg, Oral, Daily, Vann, Jessica U, DO, 7.5 mg at 04/14/21 0854   ondansetron (ZOFRAN) tablet 4 mg, 4 mg, Oral, Q6H PRN **OR** ondansetron (ZOFRAN) injection 4 mg, 4 mg, Intravenous, Q6H PRN, Lurene Shadow, MD   pantoprazole (PROTONIX) EC tablet 40 mg, 40 mg, Oral, Daily, Lurene Shadow, MD, 40 mg at 04/14/21 0854   polyethylene glycol (MIRALAX / GLYCOLAX) packet 17 g, 17 g, Oral, Daily PRN, Lurene Shadow, MD   sacubitril-valsartan (ENTRESTO) 24-26 mg per tablet, 1 tablet, Oral, BID, Wendall Stade, MD   sodium chloride flush (NS) 0.9 % injection 3 mL, 3 mL, Intravenous, Q12H, Wendall Stade, MD  aspirin EC  81 mg Oral Daily   carvedilol  3.125 mg Oral BID WC   ciprofloxacin  500 mg Oral BID   enoxaparin (LOVENOX) injection  40 mg Subcutaneous Q24H   furosemide  20 mg Oral Daily   meloxicam  7.5 mg Oral Daily   pantoprazole  40 mg Oral Daily   sacubitril-valsartan  1 tablet Oral BID   sodium chloride flush  3 mL Intravenous Q12H     Physical Exam: Blood pressure 108/79, pulse 99, temperature 97.6 F (36.4 C), temperature source Axillary, resp. rate 20, height 5\' 10"  (1.778 m), weight 92.5 kg, SpO2 98 %.    Legally blind JVP normal  Decreased BS left base Mild SEM Abdomen benign Post recent right TKR with LE varicosities Post lumbar back surgery   Labs:   Lab Results  Component Value Date   WBC 4.4 04/14/2021   HGB 12.8 (L) 04/14/2021   HCT 38.3 (L) 04/14/2021   MCV 86.1 04/14/2021   PLT 184 04/14/2021    Recent Labs  Lab 04/11/21 1536 04/11/21 2133 04/14/21 0603  NA  --    < > 137  K   --    < > 3.7  CL  --    < > 106  CO2  --    < > 23  BUN  --    < > 17  CREATININE  --    < > 1.09  CALCIUM  --    < > 8.6*  PROT 6.2*  --   --   BILITOT 1.1  --   --   ALKPHOS 68  --   --   ALT 16  --   --  AST 21  --   --   GLUCOSE  --    < > 100*   < > = values in this interval not displayed.   No results found for: CKTOTAL, CKMB, CKMBINDEX, TROPONINI No results found for: CHOL No results found for: HDL No results found for: LDLCALC No results found for: TRIG No results found for: CHOLHDL No results found for: LDLDIRECT    Radiology: DG Chest 1 View  Result Date: 04/12/2021 CLINICAL DATA:  Short of breath. Status post thoracentesis, 950 mL removed. EXAM: CHEST  1 VIEW COMPARISON:  04/12/2021 at 8:46 a.m.  CT, 04/11/2021. FINDINGS: No visualized right pleural effusion.  No pneumothorax. Persistent opacity at the left lung base consistent with a small effusion with associated atelectasis. Prominent vascular and interstitial markings, stable. IMPRESSION: 1. No residual right pleural effusion visualized. 2. No pneumothorax. 3. Persistent left lung base opacity consistent with a combination of pleural fluid and atelectasis. Electronically Signed   By: Amie Portland M.D.   On: 04/12/2021 12:02   DG Chest 2 View  Result Date: 04/12/2021 CLINICAL DATA:  Pleural effusion.  Shortness of breath. EXAM: CHEST - 2 VIEW COMPARISON:  CT scan April 11, 2021 FINDINGS: The patient has known bilateral pleural effusions seen on the CT scan from yesterday persists but appear to be relatively small on today's imaging. The cardiomediastinal silhouette is unremarkable. No pneumothorax. No nodules or masses. No focal infiltrates. IMPRESSION: Bilateral pleural effusions as above, left greater than right. These are smaller than expected based on yesterday's CT scan. Electronically Signed   By: Gerome Sam III M.D.   On: 04/12/2021 09:58   CT Angio Chest PE W and/or Wo Contrast  Result Date:  04/11/2021 CLINICAL DATA:  Shortness of breath EXAM: CT ANGIOGRAPHY CHEST WITH CONTRAST TECHNIQUE: Multidetector CT imaging of the chest was performed using the standard protocol during bolus administration of intravenous contrast. Multiplanar CT image reconstructions and MIPs were obtained to evaluate the vascular anatomy. CONTRAST:  75mL OMNIPAQUE IOHEXOL 350 MG/ML SOLN COMPARISON:  None. FINDINGS: Cardiovascular: Satisfactory opacification of the pulmonary arteries to the segmental level. No evidence of pulmonary embolism. Heart is mildly enlarged. Coronary artery atherosclerotic calcifications. No pericardial effusion. Mediastinum/Nodes: No enlarged mediastinal, hilar, or axillary lymph nodes. Thyroid gland, trachea, and esophagus demonstrate no significant findings. Lungs/Pleura: Moderate-to-large bilateral pleural effusions with mild compressive atelectasis. No evidence of pneumonia or pulmonary edema. Upper Abdomen: No acute abnormality. Musculoskeletal: Partially imaged posterior spinal fusion at the thoracolumbar junction. No acute osseous abnormality. Review of the MIP images confirms the above findings. IMPRESSION: 1. No evidence of pulmonary embolism. 2. Moderate-to-large bilateral pleural effusions with compressive atelectasis. No evidence of pneumonia or pulmonary edema. 3. Mild cardiomegaly with coronary artery atherosclerotic disease. Electronically Signed   By: Larose Hires D.O.   On: 04/11/2021 10:59   ECHOCARDIOGRAM COMPLETE  Result Date: 04/12/2021    ECHOCARDIOGRAM REPORT   Patient Name:   Gary Ferguson Date of Exam: 04/12/2021 Medical Rec #:  [id number]      Height:       70.0 in Accession #:    [id number]     Weight:       203.4 lb Date of Birth:  [birth year]       BSA:          2.102 m Patient Age:    65 years       BP:           118/87 mmHg Patient  Gender: M              HR:           109 bpm. Exam Location:  Inpatient Procedure: 2D Echo, Cardiac Doppler and Color Doppler Indications:     Dyspnea  History:        Patient has no prior history of Echocardiogram examinations. Hx                 DVT. GERD.  Sonographer:    Ross Ludwig RDCS (AE) Referring Phys: [id number] Lurene Shadow IMPRESSIONS  1. Left ventricular ejection fraction, by estimation, is <20%. The left ventricle has severely decreased function. The left ventricle demonstrates regional wall motion abnormalities (see scoring diagram/findings for description). The left ventricular internal cavity size was mildly dilated. Indeterminate diastolic filling due to E-A fusion.  2. Right ventricular systolic function is normal. The right ventricular size is normal.  3. Left atrial size was mildly dilated.  4. The mitral valve is grossly normal. Mild mitral valve regurgitation. No evidence of mitral stenosis.  5. The aortic valve is tricuspid. Aortic valve regurgitation is not visualized. No aortic stenosis is present.  6. The inferior vena cava is dilated in size with >50% respiratory variability, suggesting right atrial pressure of 8 mmHg. Comparison(s): Prior images unable to be directly viewed, comparison made by report only. Per Care Everywhere, Echo 05/05/09: EF 45-50%, global hypokinesis with akinesis of apical septum/lateral/anterior walls. Conclusion(s)/Recommendation(s): Severely reduced LVEF. No prior in our system to compare. Wall motion abnormalities as noted. Findings communicated with Dr. Benjamine Mola. FINDINGS  Left Ventricle: Left ventricular ejection fraction, by estimation, is <20%. The left ventricle has severely decreased function. The left ventricle demonstrates regional wall motion abnormalities. The left ventricular internal cavity size was mildly dilated. There is no left ventricular hypertrophy. Indeterminate diastolic filling due to E-A fusion.  LV Wall Scoring: The entire septum and entire apex are akinetic. The anterior wall, antero-lateral wall, inferior wall, and posterior wall are hypokinetic. Right Ventricle: The right ventricular  size is normal. No increase in right ventricular wall thickness. Right ventricular systolic function is normal. Left Atrium: Left atrial size was mildly dilated. Right Atrium: Right atrial size was not well visualized. Pericardium: There is no evidence of pericardial effusion. Presence of epicardial fat layer. Mitral Valve: The mitral valve is grossly normal. Mild mitral valve regurgitation. No evidence of mitral valve stenosis. MV peak gradient, 6.1 mmHg. The mean mitral valve gradient is 3.0 mmHg. Tricuspid Valve: The tricuspid valve is grossly normal. Tricuspid valve regurgitation is trivial. No evidence of tricuspid stenosis. Aortic Valve: The aortic valve is tricuspid. Aortic valve regurgitation is not visualized. No aortic stenosis is present. Pulmonic Valve: The pulmonic valve was not well visualized. Pulmonic valve regurgitation is not visualized. No evidence of pulmonic stenosis. Aorta: The aortic root, ascending aorta and aortic arch are all structurally normal, with no evidence of dilitation or obstruction. Venous: The inferior vena cava is dilated in size with greater than 50% respiratory variability, suggesting right atrial pressure of 8 mmHg. IAS/Shunts: The interatrial septum was not well visualized.  LEFT VENTRICLE PLAX 2D LVIDd:         5.00 cm LVIDs:         4.80 cm LV PW:         1.10 cm LV IVS:        1.10 cm LVOT diam:     2.40 cm LVOT Area:     4.52 cm  LV  Volumes (MOD) LV vol d, MOD A2C: 268.0 ml LV vol d, MOD A4C: 225.0 ml LV vol s, MOD A2C: 194.0 ml LV vol s, MOD A4C: 179.0 ml LV SV MOD A2C:     74.0 ml LV SV MOD A4C:     225.0 ml LV SV MOD BP:      61.9 ml RIGHT VENTRICLE             IVC RV Basal diam:  3.40 cm     IVC diam: 2.10 cm RV S prime:     11.30 cm/s TAPSE (M-mode): 1.6 cm LEFT ATRIUM             Index LA diam:        3.60 cm 1.71 cm/m LA Vol (A2C):   48.2 ml 22.93 ml/m LA Vol (A4C):   58.1 ml 27.64 ml/m LA Biplane Vol: 56.3 ml 26.78 ml/m   AORTA Ao Root diam: 3.60 cm Ao Asc  diam:  3.60 cm MITRAL VALVE MV Peak grad: 6.1 mmHg  SHUNTS MV Mean grad: 3.0 mmHg  Systemic Diam: 2.40 cm MV Vmax:      1.23 m/s MV Vmean:     71.6 cm/s Jodelle Red MD Electronically signed by Jodelle Red MD Signature Date/Time: 04/12/2021/6:56:39 PM    Final    US THORACENTESIS ASP PLEURAL SPACE W/IMG GUIDE  Result Date: 04/12/2021 INDICATION: Shortness of breath, bilateral pleural effusions seen on chest x-ray. Request for therapeutic and diagnostic thoracentesis. EXAM: ULTRASOUND GUIDED RIGHT THORACENTESIS MEDICATIONS: 10 mL 1% lidocaine COMPLICATIONS: None immediate. PROCEDURE: An ultrasound guided thoracentesis was thoroughly discussed with the patient and questions answered. The benefits, risks, alternatives and complications were also discussed. The patient understands and wishes to proceed with the procedure. Written consent was obtained. Ultrasound was performed to localize and mark an adequate pocket of fluid in the right chest. The area was then prepped and draped in the normal sterile fashion. 1% Lidocaine was used for local anesthesia. Under ultrasound guidance a 19 gauge, 7-cm, Yueh catheter was introduced. Thoracentesis was performed. The catheter was removed and a dressing applied. FINDINGS: A total of approximately 950 cc of hazy yellow fluid was removed. Samples were sent to the laboratory as requested by the clinical team. Post procedure chest X-ray reviewed, negative for pneumothorax. IMPRESSION: Successful ultrasound guided right thoracentesis yielding 950 cc of pleural fluid. Read by: Lawernce Ion, PA-C Electronically Signed   By: Judie Petit.  Shick M.D.   On: 04/12/2021 14:17    EKG: ST rate 115 PVC no acute ST changes   ASSESSMENT AND PLAN:   Acute Systolic CHF:  likely non ischemic with global hypokinesis and no acute ECG changes troponin negative. Not particularly volume overloaded and tachycardia suggests low SV. Add endtresto , low dose oral lasix and low dose coreg.  Discussed need for right and left heart cath on Thursday. Risks including stroke , bleeding, CHF, intubation , surgery discussed and willing to proceed. Orders written and lab called  ID:  chronic cipro suppression for previous lumbar hardware infection IVC filter:  BARD has legal case pending for device likely contributes to his RLE phlebitis/varicosities  TKR:  on right healed well good ROM Legally Blind:  ? Birth trauma been this way since middle school   Signed: Charlton Haws 04/14/2021, 8:55 AM

## 2021-04-15 DIAGNOSIS — H548 Legal blindness, as defined in USA: Secondary | ICD-10-CM

## 2021-04-15 DIAGNOSIS — J90 Pleural effusion, not elsewhere classified: Secondary | ICD-10-CM | POA: Diagnosis not present

## 2021-04-15 DIAGNOSIS — I5021 Acute systolic (congestive) heart failure: Secondary | ICD-10-CM | POA: Diagnosis not present

## 2021-04-15 LAB — BASIC METABOLIC PANEL
Anion gap: 11 (ref 5–15)
BUN: 22 mg/dL (ref 8–23)
CO2: 22 mmol/L (ref 22–32)
Calcium: 8.6 mg/dL — ABNORMAL LOW (ref 8.9–10.3)
Chloride: 106 mmol/L (ref 98–111)
Creatinine, Ser: 1.29 mg/dL — ABNORMAL HIGH (ref 0.61–1.24)
GFR, Estimated: >60 mL/min (ref >60)
Glucose, Bld: 98 mg/dL (ref 70–99)
Potassium: 4 mmol/L (ref 3.5–5.1)
Sodium: 139 mmol/L (ref 135–145)

## 2021-04-15 LAB — MAGNESIUM: Magnesium: 2 mg/dL (ref 1.7–2.4)

## 2021-04-15 MED ORDER — SODIUM CHLORIDE 0.9 % IV SOLN: 250.0000 mL | INTRAVENOUS | Status: DC | PRN

## 2021-04-15 MED ORDER — ASPIRIN 81 MG PO CHEW
81.0000 mg | CHEWABLE_TABLET | ORAL | Status: AC
  Administered 2021-04-16: 81 mg via ORAL
  Filled 2021-04-15: qty 1

## 2021-04-15 MED ORDER — SODIUM CHLORIDE 0.9% FLUSH: 3.0000 mL | INTRAVENOUS | Status: DC | PRN

## 2021-04-15 MED ORDER — SODIUM CHLORIDE 0.9 % IV SOLN: INTRAVENOUS | Status: DC

## 2021-04-15 MED ORDER — LISINOPRIL 5 MG PO TABS
2.5000 mg | ORAL_TABLET | Freq: Every day | ORAL | Status: DC
  Administered 2021-04-15 – 2021-04-17 (×2): 2.5 mg via ORAL
  Filled 2021-04-15 (×3): qty 1

## 2021-04-15 NOTE — Progress Notes (Signed)
PROGRESS NOTE    VIVAAN HELSETH  VOJ:[id number] DOB: [birth year] DOA: 04/11/2021 PCP: Elfredia Nevins, MD    Chief Complaint  Patient presents with   Shortness of Breath    Brief Narrative:  ODDIS WESTLING is an 66 y.o. male with medical history significant for recent right knee surgery, arthritis, chronic back pain, history of DVT about 12 years ago, GERD, kidney stones, who presented to the hospital because of cough and shortness of breath.  He said cough started about a week before Christmas last year and has progressively worsened.  He also developed shortness of breath about a week ago.  However shortness of breath has progressively worsened.  This morning, he felt so short of breath he could barely do anything.  Tx from [facility] to [facility] for thoracentesis.  2D echo result revealed LVEF less than 20%.  Cardiology paged, Dr. Algie Coffer 04/13/2021, to assist with the management of new acute HFrEF.  The following day 04/14/2021, the writer paged [facility] for consultation.  Appreciate cardiology's assistance.  pt diuresed appropriately.  Holding lasix for low BP and mild bump in creatinine.   Assessment & Plan:   Principal Problem:   Pleural effusion, bilateral Active Problems:   Shortness of breath   Legally blind   Wound infection complicating hardware, subsequent encounter   Acute systolic CHF (congestive heart failure) (HCC)   Bilateral pleural effusions secondary to acute systolic heart failure Patient underwent paracentesis on 04/12/2021 by IR and 950 mL of fluid was removed.  Echocardiogram revealed left ventricular ejection fraction of 20%, regional wall motion abnormalities, global hypokinesis with akinesis of the apical septum and lateral/anterior walls Patient appropriately diuresed with IV Lasix. Continue strict intake and output and daily weights.  Cardiology on board and appreciate recommendations. Patient was started on Lasix and low-dose Coreg. Plan for cardiac catheterization on  Thursday, 04/16/2021.    History of infected hardware and lower back Continue with chronic ciprofloxacin.  S/p right TKA No new issues at this time.    Legal blindness No issues at this time.   Mild AKI probably from overdiuresis Hold the Lasix and continue to monitor. Recheck BMP in the morning.       DVT prophylaxis: (Lovenox/) Code Status: (Full Code) Family Communication: none. Disposition:   Status is: Inpatient  Remains inpatient appropriate because: PLAN for cath in am.      Consultants:  CARDIOLOGY.   Procedures: Antimicrobials:none.   Subjective: Some coughing, no sob OR chest pain.  Objective: Vitals:   04/14/21 2354 04/15/21 0424 04/15/21 0729 04/15/21 0800  BP: (!) 87/65 102/75 106/83 100/73  Pulse: 88  92   Resp:  18 17 18   Temp: 97.9 F (36.6 C) (!) 97.5 F (36.4 C) 97.7 F (36.5 C)   TempSrc: Oral Oral Oral   SpO2: 97% 96% 97%   Weight:  89.9 kg    Height:        Intake/Output Summary (Last 24 hours) at 04/15/2021 1152 Last data filed at 04/15/2021 0800 Gross per 24 hour  Intake 870 ml  Output 1075 ml  Net -205 ml   Filed Weights   04/13/21 0431 04/14/21 0421 04/15/21 0424  Weight: 91.5 kg 92.5 kg 89.9 kg    Examination:  General exam: Appears calm and comfortable  Respiratory system: Clear to auscultation. Respiratory effort normal. Cardiovascular system: S1 & S2 heard, RRR. No JVD, No pedal edema. Gastrointestinal system: Abdomen is nondistended, soft and nontender. Normal bowel sounds heard. Central nervous system:  Alert and oriented. No focal neurological deficits. Extremities: Symmetric 5 x 5 power. Skin: No rashes, lesions or ulcers Psychiatry: Mood & affect appropriate.     Data Reviewed: I have personally reviewed following labs and imaging studies  CBC: Recent Labs  Lab 04/11/21 0916 04/11/21 2133 04/14/21 0603  WBC 6.5 5.9 4.4  NEUTROABS 5.2  --   --   HGB 13.3 13.1 12.8*  HCT 41.0 39.0 38.3*  MCV  89.1 86.3 86.1  PLT 220 227 184    Basic Metabolic Panel: Recent Labs  Lab 04/11/21 0916 04/11/21 2133 04/13/21 0236 04/14/21 0603 04/15/21 0243  NA 140 140 136 137 139  K 3.9 3.5 3.6 3.7 4.0  CL 107 111 103 106 106  CO2 24 21* 24 23 22   GLUCOSE 126* 136* 103* 100* 98  BUN 23 20 19 17 22   CREATININE 1.09 1.13 1.19 1.09 1.29*  CALCIUM 8.7* 8.5* 8.7* 8.6* 8.6*  MG  --   --   --  2.0 2.0  PHOS  --   --   --  4.3  --     GFR: Estimated Creatinine Clearance: 64.4 mL/min (A) (by C-G formula based on SCr of 1.29 mg/dL (H)).  Liver Function Tests: Recent Labs  Lab 04/11/21 1536  AST 21  ALT 16  ALKPHOS 68  BILITOT 1.1  PROT 6.2*  ALBUMIN 3.7    CBG: No results for input(s): GLUCAP in the last 168 hours.   Recent Results (from the past 240 hour(s))  Resp Panel by RT-PCR (Flu A&B, Covid) Nasopharyngeal Swab     Status: None   Collection Time: 04/11/21  9:13 AM   Specimen: Nasopharyngeal Swab; Nasopharyngeal(NP) swabs in vial transport medium  Result Value Ref Range Status   SARS Coronavirus 2 by RT PCR NEGATIVE NEGATIVE Final    Comment: (NOTE) SARS-CoV-2 target nucleic acids are NOT DETECTED.  The SARS-CoV-2 RNA is generally detectable in upper respiratory specimens during the acute phase of infection. The lowest concentration of SARS-CoV-2 viral copies this assay can detect is 138 copies/mL. A negative result does not preclude SARS-Cov-2 infection and should not be used as the sole basis for treatment or other patient management decisions. A negative result may occur with  improper specimen collection/handling, submission of specimen other than nasopharyngeal swab, presence of viral mutation(s) within the areas targeted by this assay, and inadequate number of viral copies(<138 copies/mL). A negative result must be combined with clinical observations, patient history, and epidemiological information. The expected result is Negative.  Fact Sheet for Patients:   06/09/21  Fact Sheet for Healthcare Providers:  06/09/21  This test is no t yet approved or cleared by the [employer] FDA and  has been authorized for detection and/or diagnosis of SARS-CoV-2 by FDA under an Emergency Use Authorization (EUA). This EUA will remain  in effect (meaning this test can be used) for the duration of the COVID-19 declaration under Section 564(b)(1) of the Act, 21 U.S.C.section 360bbb-3(b)(1), unless the authorization is terminated  or revoked sooner.       Influenza A by PCR NEGATIVE NEGATIVE Final   Influenza B by PCR NEGATIVE NEGATIVE Final    Comment: (NOTE) The Xpert Xpress SARS-CoV-2/FLU/RSV plus assay is intended as an aid in the diagnosis of influenza from Nasopharyngeal swab specimens and should not be used as a sole basis for treatment. Nasal washings and aspirates are unacceptable for Xpert Xpress SARS-CoV-2/FLU/RSV testing.  Fact Sheet for Patients: SeriousBroker.it  Fact Sheet  for Healthcare Providers: SeriousBroker.it  This test is not yet approved or cleared by the [place] and has been authorized for detection and/or diagnosis of SARS-CoV-2 by FDA under an Emergency Use Authorization (EUA). This EUA will remain in effect (meaning this test can be used) for the duration of the COVID-19 declaration under Section 564(b)(1) of the Act, 21 U.S.C. section 360bbb-3(b)(1), unless the authorization is terminated or revoked.  Performed at [facility], [address], [place], [place] [address]   MRSA Next Gen by PCR, Nasal     Status: None   Collection Time: 04/12/21  5:10 AM   Specimen: Nasal Mucosa; Nasal Swab  Result Value Ref Range Status   MRSA by PCR Next Gen NOT DETECTED NOT DETECTED Final    Comment: (NOTE) The GeneXpert MRSA Assay (FDA approved for NASAL specimens only), is one component of a  comprehensive MRSA colonization surveillance program. It is not intended to diagnose MRSA infection nor to guide or monitor treatment for MRSA infections. Test performance is not FDA approved in patients less than 52 years old. Performed at [facility], [address], [place], [place] [address]   Culture, body fluid w Gram Stain-bottle     Status: None (Preliminary result)   Collection Time: 04/12/21 11:57 AM   Specimen: Pleura  Result Value Ref Range Status   Specimen Description PLEURAL RIGHT LUNG  Final   Special Requests NONE  Final   Culture   Final    NO GROWTH 2 DAYS Performed at [facility], [address], [place], [place] [address]    Report Status PENDING  Incomplete  Gram stain     Status: None   Collection Time: 04/12/21 11:57 AM   Specimen: Pleura  Result Value Ref Range Status   Specimen Description PLEURAL RIGHT LUNG  Final   Special Requests NONE  Final   Gram Stain   Final    WBC PRESENT,BOTH PMN AND MONONUCLEAR NO ORGANISMS SEEN CYTOSPIN SMEAR Performed at [facility], [address], [place], [place] [address]    Report Status 04/12/2021 FINAL  Final         Radiology Studies: No results found.      Scheduled Meds:  aspirin EC  81 mg Oral Daily   carvedilol  3.125 mg Oral BID WC   ciprofloxacin  500 mg Oral BID   enoxaparin (LOVENOX) injection  40 mg Subcutaneous Q24H   lisinopril  2.5 mg Oral QHS   pantoprazole  40 mg Oral Daily   sodium chloride flush  3 mL Intravenous Q12H   Continuous Infusions:   LOS: 4 days        Kathlen Mody, MD [facility]   To contact the attending provider between 7A-7P or the covering provider during after hours 7P-7A, please log into the web site [email or web address] and access using universal [place] password for that web site. If you do not have the password, please call the hospital operator.  04/15/2021, 11:52 AM

## 2021-04-15 NOTE — Progress Notes (Signed)
° ° °  Subjective:  Walking the halls to keep his new knee limber Had low BP last night unable to take entresto  Objective:  Vitals:   04/14/21 2354 04/15/21 0424 04/15/21 0729 04/15/21 0800  BP: (!) 87/65 102/75 106/83 100/73  Pulse: 88  92   Resp:  18 17 18   Temp: 97.9 F (36.6 C) (!) 97.5 F (36.4 C) 97.7 F (36.5 C)   TempSrc: Oral Oral Oral   SpO2: 97% 96% 97%   Weight:  89.9 kg    Height:        Intake/Output from previous day:  Intake/Output Summary (Last 24 hours) at 04/15/2021 0848 Last data filed at 04/15/2021 0800 Gross per 24 hour  Intake 870 ml  Output 1075 ml  Net -205 ml    Physical Exam: Legally Blind  No distress Lungs clear No murmur Abdomen benign No edema Post right TKR  Post lumbar fusion   Lab Results: Basic Metabolic Panel: Recent Labs    04/14/21 0603 04/15/21 0243  NA 137 139  K 3.7 4.0  CL 106 106  CO2 23 22  GLUCOSE 100* 98  BUN 17 22  CREATININE 1.09 1.29*  CALCIUM 8.6* 8.6*  MG 2.0 2.0  PHOS 4.3  --    Liver Function Tests: No results for input(s): AST, ALT, ALKPHOS, BILITOT, PROT, ALBUMIN in the last 72 hours. No results for input(s): LIPASE, AMYLASE in the last 72 hours. CBC: Recent Labs    04/14/21 0603  WBC 4.4  HGB 12.8*  HCT 38.3*  MCV 86.1  PLT 184     Imaging: No results found.  Cardiac Studies:   Telemetry:  NSR 04/15/2021   Echo: EF < 20% global hypokinesis   Medications:    aspirin EC  81 mg Oral Daily   carvedilol  3.125 mg Oral BID WC   ciprofloxacin  500 mg Oral BID   enoxaparin (LOVENOX) injection  40 mg Subcutaneous Q24H   furosemide  20 mg Oral Daily   pantoprazole  40 mg Oral Daily   sacubitril-valsartan  1 tablet Oral BID   sodium chloride flush  3 mL Intravenous Q12H      Assessment/Plan:   CHF: likely non ischemic He does not appear volume overloaded Hold lasix continue coreg. Try to give low dose ACE at night d/c entresto For right and left cath with Dr 06/13/2021 tomorrow  8:30 ID:  chronic cipro for hardware infection  IVC filter:  previous DVT/PE TKR:  good rehab right sided about 8 weeks ago   Teressa Lower 04/15/2021, 8:48 AM

## 2021-04-15 NOTE — Progress Notes (Signed)
Heart Failure Stewardship Pharmacist Progress Note   PCP: Elfredia Nevins, MD PCP-Cardiologist: None    HPI:  66 yo M with PMH of DVT, arthritis, GERD, and recent RKA. He presented to the ED on 04/11/21 with shortness of breath, cough, and congestion. CXR significant for bilateral pleural effusions. An ECHO was done on 04/12/21 and LVEF <20%. R/LHC scheduled for 04/16/21.  Current HF Medications: Beta Blocker: carvedilol 3.125 mg BID ACE/ARB/ARNI: lisinopril 2.5 mg qhs  Prior to admission HF Medications: None  Pertinent Lab Values: Serum creatinine 1.29, BUN 22, Potassium 4.0, Sodium 139, BNP 384, Magnesium 2.0  Vital Signs: Weight: 198 lbs (admission weight: 204 lbs) Blood pressure: 100/70s  Heart rate: 80-90s  I/O: -1L yesterday; net -2.8L  Medication Assistance / Insurance Benefits Check: Does the patient have prescription insurance?  Yes Type of insurance plan: Medicare  Outpatient Pharmacy:  Prior to admission [facility] Is the patient willing to use [facility] TOC pharmacy at discharge? Yes Is the patient willing to transition their outpatient pharmacy to utilize a [facility] outpatient pharmacy?   Pending    Assessment: 1. Acute on chronic systolic CHF (EF <57%). NYHA class II symptoms - Continue carvedilol 3.125 mg BID - Off Entresto 24/26 mg BID with low BP last night. Continue lisinopril 2.5 mg qhs - Consider spironolactone and SGLT2i post cath. Would check A1c prior to starting SGLT2i.   Plan: 1) Medication changes recommended at this time: - Check A1c for SGLT2i  2) Patient assistance: - Entresto copay $10.35 - Farxiga/Jardiance copay $10.35  3)  Education  - To be completed prior to discharge  Sharen Hones, PharmD, BCPS Heart Failure Stewardship Pharmacist Phone [phone]

## 2021-04-16 ENCOUNTER — Encounter (HOSPITAL_COMMUNITY)
Admission: EM | Disposition: A | Discharge status: Home / Self Care | Payer: Self-pay | Source: Home / Self Care | Attending: Internal Medicine

## 2021-04-16 ENCOUNTER — Inpatient Hospital Stay (HOSPITAL_COMMUNITY): Payer: No Typology Code available for payment source

## 2021-04-16 ENCOUNTER — Encounter (HOSPITAL_COMMUNITY): Payer: Self-pay | Admitting: Internal Medicine

## 2021-04-16 DIAGNOSIS — I2510 Atherosclerotic heart disease of native coronary artery without angina pectoris: Secondary | ICD-10-CM | POA: Diagnosis not present

## 2021-04-16 DIAGNOSIS — I5021 Acute systolic (congestive) heart failure: Secondary | ICD-10-CM | POA: Diagnosis not present

## 2021-04-16 DIAGNOSIS — H548 Legal blindness, as defined in USA: Secondary | ICD-10-CM | POA: Diagnosis not present

## 2021-04-16 DIAGNOSIS — I509 Heart failure, unspecified: Secondary | ICD-10-CM | POA: Diagnosis not present

## 2021-04-16 DIAGNOSIS — J90 Pleural effusion, not elsewhere classified: Secondary | ICD-10-CM | POA: Diagnosis not present

## 2021-04-16 HISTORY — PX: RIGHT/LEFT HEART CATH AND CORONARY ANGIOGRAPHY: CATH118266

## 2021-04-16 LAB — POCT I-STAT 7, (LYTES, BLD GAS, ICA,H+H)
Acid-base deficit: 4 mmol/L — ABNORMAL HIGH (ref 0.0–2.0)
Bicarbonate: 19.1 mmol/L — ABNORMAL LOW (ref 20.0–28.0)
Calcium, Ion: 1.03 mmol/L — ABNORMAL LOW (ref 1.15–1.40)
HCT: 34 % — ABNORMAL LOW (ref 39.0–52.0)
Hemoglobin: 11.6 g/dL — ABNORMAL LOW (ref 13.0–17.0)
O2 Saturation: 96 %
Potassium: 3.5 mmol/L (ref 3.5–5.1)
Sodium: 144 mmol/L (ref 135–145)
TCO2: 20 mmol/L — ABNORMAL LOW (ref 22–32)
pCO2 arterial: 28.9 mmHg — ABNORMAL LOW (ref 32.0–48.0)
pH, Arterial: 7.428 (ref 7.350–7.450)
pO2, Arterial: 77 mmHg — ABNORMAL LOW (ref 83.0–108.0)

## 2021-04-16 LAB — POCT I-STAT EG7
Acid-base deficit: 2 mmol/L (ref 0.0–2.0)
Acid-base deficit: 3 mmol/L — ABNORMAL HIGH (ref 0.0–2.0)
Acid-base deficit: 4 mmol/L — ABNORMAL HIGH (ref 0.0–2.0)
Bicarbonate: 20.1 mmol/L (ref 20.0–28.0)
Bicarbonate: 20.2 mmol/L (ref 20.0–28.0)
Bicarbonate: 21.6 mmol/L (ref 20.0–28.0)
Calcium, Ion: 1.01 mmol/L — ABNORMAL LOW (ref 1.15–1.40)
Calcium, Ion: 1.07 mmol/L — ABNORMAL LOW (ref 1.15–1.40)
Calcium, Ion: 1.2 mmol/L (ref 1.15–1.40)
HCT: 33 % — ABNORMAL LOW (ref 39.0–52.0)
HCT: 36 % — ABNORMAL LOW (ref 39.0–52.0)
HCT: 36 % — ABNORMAL LOW (ref 39.0–52.0)
Hemoglobin: 11.2 g/dL — ABNORMAL LOW (ref 13.0–17.0)
Hemoglobin: 12.2 g/dL — ABNORMAL LOW (ref 13.0–17.0)
Hemoglobin: 12.2 g/dL — ABNORMAL LOW (ref 13.0–17.0)
O2 Saturation: 72 %
O2 Saturation: 72 %
O2 Saturation: 74 %
Potassium: 3.4 mmol/L — ABNORMAL LOW (ref 3.5–5.1)
Potassium: 3.5 mmol/L (ref 3.5–5.1)
Potassium: 3.9 mmol/L (ref 3.5–5.1)
Sodium: 141 mmol/L (ref 135–145)
Sodium: 144 mmol/L (ref 135–145)
Sodium: 144 mmol/L (ref 135–145)
TCO2: 21 mmol/L — ABNORMAL LOW (ref 22–32)
TCO2: 21 mmol/L — ABNORMAL LOW (ref 22–32)
TCO2: 23 mmol/L (ref 22–32)
pCO2, Ven: 31.6 mmHg — ABNORMAL LOW (ref 44.0–60.0)
pCO2, Ven: 32.8 mmHg — ABNORMAL LOW (ref 44.0–60.0)
pCO2, Ven: 33.6 mmHg — ABNORMAL LOW (ref 44.0–60.0)
pH, Ven: 7.394 (ref 7.250–7.430)
pH, Ven: 7.414 (ref 7.250–7.430)
pH, Ven: 7.415 (ref 7.250–7.430)
pO2, Ven: 37 mmHg (ref 32.0–45.0)
pO2, Ven: 38 mmHg (ref 32.0–45.0)
pO2, Ven: 38 mmHg (ref 32.0–45.0)

## 2021-04-16 LAB — BASIC METABOLIC PANEL
Anion gap: 6 (ref 5–15)
BUN: 23 mg/dL (ref 8–23)
CO2: 21 mmol/L — ABNORMAL LOW (ref 22–32)
Calcium: 8.6 mg/dL — ABNORMAL LOW (ref 8.9–10.3)
Chloride: 108 mmol/L (ref 98–111)
Creatinine, Ser: 1.28 mg/dL — ABNORMAL HIGH (ref 0.61–1.24)
GFR, Estimated: >60 mL/min (ref >60)
Glucose, Bld: 106 mg/dL — ABNORMAL HIGH (ref 70–99)
Potassium: 3.8 mmol/L (ref 3.5–5.1)
Sodium: 135 mmol/L (ref 135–145)

## 2021-04-16 SURGERY — RIGHT/LEFT HEART CATH AND CORONARY ANGIOGRAPHY
Anesthesia: LOCAL

## 2021-04-16 MED ORDER — ENOXAPARIN SODIUM 40 MG/0.4ML IJ SOSY
40.0000 mg | PREFILLED_SYRINGE | INTRAMUSCULAR | Status: DC
  Filled 2021-04-16 (×2): qty 0.4

## 2021-04-16 MED ORDER — SODIUM CHLORIDE 0.9 % IV SOLN: INTRAVENOUS | Status: AC

## 2021-04-16 MED ORDER — HEPARIN (PORCINE) IN NACL 1000-0.9 UT/500ML-% IV SOLN
INTRAVENOUS | Status: AC
  Filled 2021-04-16: qty 1000

## 2021-04-16 MED ORDER — ONDANSETRON HCL 4 MG/2ML IJ SOLN: 4.0000 mg | Freq: Four times a day (QID) | INTRAMUSCULAR | Status: DC | PRN

## 2021-04-16 MED ORDER — SODIUM CHLORIDE 0.9% FLUSH: 3.0000 mL | INTRAVENOUS | Status: DC | PRN

## 2021-04-16 MED ORDER — IOHEXOL 350 MG/ML SOLN
INTRAVENOUS | Status: DC | PRN
  Administered 2021-04-16: 140 mL

## 2021-04-16 MED ORDER — FENTANYL CITRATE (PF) 100 MCG/2ML IJ SOLN
INTRAMUSCULAR | Status: AC
  Filled 2021-04-16: qty 2

## 2021-04-16 MED ORDER — HEPARIN (PORCINE) IN NACL 1000-0.9 UT/500ML-% IV SOLN
INTRAVENOUS | Status: DC | PRN
  Administered 2021-04-16 (×2): 500 mL

## 2021-04-16 MED ORDER — VERAPAMIL HCL 2.5 MG/ML IV SOLN
INTRAVENOUS | Status: DC | PRN
  Administered 2021-04-16: 10 mL via INTRA_ARTERIAL

## 2021-04-16 MED ORDER — GADOBUTROL 1 MMOL/ML IV SOLN
10.0000 mL | Freq: Once | INTRAVENOUS | Status: AC | PRN
  Administered 2021-04-16: 10 mL via INTRAVENOUS

## 2021-04-16 MED ORDER — VERAPAMIL HCL 2.5 MG/ML IV SOLN
INTRAVENOUS | Status: AC
  Filled 2021-04-16: qty 2

## 2021-04-16 MED ORDER — MIDAZOLAM HCL 2 MG/2ML IJ SOLN
INTRAMUSCULAR | Status: DC | PRN
  Administered 2021-04-16: 1 mg via INTRAVENOUS

## 2021-04-16 MED ORDER — SODIUM CHLORIDE 0.9% FLUSH
3.0000 mL | Freq: Two times a day (BID) | INTRAVENOUS | Status: DC
  Administered 2021-04-16 – 2021-04-18 (×3): 3 mL via INTRAVENOUS

## 2021-04-16 MED ORDER — HYDRALAZINE HCL 20 MG/ML IJ SOLN: 10.0000 mg | INTRAMUSCULAR | Status: AC | PRN

## 2021-04-16 MED ORDER — SODIUM CHLORIDE 0.9 % IV SOLN: 250.0000 mL | INTRAVENOUS | Status: DC | PRN

## 2021-04-16 MED ORDER — ACETAMINOPHEN 325 MG PO TABS: 650.0000 mg | ORAL_TABLET | ORAL | Status: DC | PRN

## 2021-04-16 MED ORDER — FENTANYL CITRATE (PF) 100 MCG/2ML IJ SOLN
INTRAMUSCULAR | Status: DC | PRN
  Administered 2021-04-16: 25 ug via INTRAVENOUS

## 2021-04-16 MED ORDER — MIDAZOLAM HCL 2 MG/2ML IJ SOLN
INTRAMUSCULAR | Status: AC
  Filled 2021-04-16: qty 2

## 2021-04-16 MED ORDER — LIDOCAINE HCL (PF) 1 % IJ SOLN
INTRAMUSCULAR | Status: DC | PRN
  Administered 2021-04-16: 5 mL

## 2021-04-16 MED ORDER — HEPARIN SODIUM (PORCINE) 1000 UNIT/ML IJ SOLN
INTRAMUSCULAR | Status: AC
  Filled 2021-04-16: qty 10

## 2021-04-16 MED ORDER — HEPARIN SODIUM (PORCINE) 1000 UNIT/ML IJ SOLN
INTRAMUSCULAR | Status: DC | PRN
  Administered 2021-04-16: 4500 [IU] via INTRAVENOUS

## 2021-04-16 MED ORDER — LIDOCAINE HCL (PF) 1 % IJ SOLN
INTRAMUSCULAR | Status: AC
  Filled 2021-04-16: qty 30

## 2021-04-16 MED ORDER — LABETALOL HCL 5 MG/ML IV SOLN: 10.0000 mg | INTRAVENOUS | Status: AC | PRN

## 2021-04-16 SURGICAL SUPPLY — 11 items
CATH 5FR JL3.5 JR4 ANG PIG MP (CATHETERS) ×1 IMPLANT
CATH BALLN WEDGE 5F 110CM (CATHETERS) ×1 IMPLANT
CATH INFINITI 5 FR 3DRC (CATHETERS) ×1 IMPLANT
CATH INFINITI 5FR AL1 (CATHETERS) ×1 IMPLANT
DEVICE RAD TR BAND REGULAR (VASCULAR PRODUCTS) ×1 IMPLANT
GLIDESHEATH SLEND SS 6F .021 (SHEATH) ×1 IMPLANT
GUIDEWIRE INQWIRE 1.5J.035X260 (WIRE) IMPLANT
INQWIRE 1.5J .035X260CM (WIRE) ×2
PACK CARDIAC CATHETERIZATION (CUSTOM PROCEDURE TRAY) ×2 IMPLANT
SHEATH GLIDE SLENDER 4/5FR (SHEATH) ×1 IMPLANT
TRANSDUCER W/STOPCOCK (MISCELLANEOUS) ×2 IMPLANT

## 2021-04-16 NOTE — Care Management Important Message (Signed)
Important Message  Patient Details  Name: Gary Ferguson MRN: [id number] Date of Birth: 29-Oct-1955   Medicare Important Message Given:  Yes     Renie Ora 04/16/2021, 8:04 AM

## 2021-04-16 NOTE — Progress Notes (Signed)
Heart Failure Stewardship Pharmacist Progress Note   PCP: Elfredia Nevins, MD PCP-Cardiologist: None    HPI:  66 yo M with PMH of DVT, arthritis, GERD, and recent RKA. He presented to the ED on 04/11/21 with shortness of breath, cough, and congestion. CXR significant for bilateral pleural effusions. An ECHO was done on 04/12/21 and LVEF <20%. R/LHC on 04/16/21 with moderate non obstructive CAD and normal filling pressures.  Current HF Medications: Beta Blocker: carvedilol 3.125 mg BID ACE/ARB/ARNI: lisinopril 2.5 mg qhs  Prior to admission HF Medications: None  Pertinent Lab Values: Serum creatinine 1.28, BUN 23, Potassium 3.4, Sodium 144, BNP 384, Magnesium 2.0  Vital Signs: Weight: 197 lbs (admission weight: 204 lbs) Blood pressure: 100/70s  Heart rate: 80-90s  I/O: -1.1L yesterday; net -3.9L  Medication Assistance / Insurance Benefits Check: Does the patient have prescription insurance?  Yes Type of insurance plan: Medicare  Outpatient Pharmacy:  [facility] Is the patient willing to use [facility]OC pharmacy at discharge? Yes Is the patient willing to transition their outpatient pharmacy to utilize a [facility] outpatient pharmacy?   Pending    Assessment: 1. Acute on chronic systolic CHF (EF <79%). NYHA class II symptoms - Continue carvedilol 3.125 mg BID - Off Entresto 24/26 mg BID with low BP. Continue lisinopril 2.5 mg qhs - Consider spironolactone and SGLT2i post cath. Would check A1c prior to starting SGLT2i.   Plan: 1) Medication changes recommended at this time: - Check A1c for SGLT2i  2) Patient assistance: - Entresto copay $10.35 - Farxiga/Jardiance copay $10.35  3)  Education  - To be completed prior to discharge  Sharen Hones, PharmD, BCPS Heart Failure Stewardship Pharmacist Phone [phone]

## 2021-04-16 NOTE — Evaluation (Signed)
Physical Therapy Evaluation Patient Details Name: Gary Ferguson MRN: [id number] DOB: 1955-12-28 Today's Date: 04/16/2021  History of Present Illness  66 y.o. male presents to [facility] 04/11/21 with c/o of 1 week of coughing and SoB. Found to be tachycardic and tachypneic with bilateral pleural effusions. Transferred to [facility] for thoracentesis 1/8 ECHO was done on 04/12/21 and LVEF <20%. PMH: recent right TKA, arthritis, chronic back pain, history of DVT about 12 years ago, GERD, kidney stones.  Clinical Impression  Pt requested to be able to go to stair case to continue to rehab his knee while he is in the hospital. PT order was place. Patient evaluated by Physical Therapy and is at baseline level of function. PT brought single step to room and pt was able step up and down x20 with supervision. PT recommending mobility team bring static step by once a day for him to practice. Pt in agreement. Pt has no follow-up Physical Therapy or equipment needs. PT is signing off.         Recommendations for follow up therapy are one component of a multi-disciplinary discharge planning process, led by the attending physician.  Recommendations may be updated based on patient status, additional functional criteria and insurance authorization.  Follow Up Recommendations No PT follow up    Assistance Recommended at Discharge None  Patient can return home with the following  Assist for transportation    Equipment Recommendations None recommended by PT     Functional Status Assessment Patient has not had a recent decline in their functional status     Precautions / Restrictions Precautions Precautions: None Restrictions Weight Bearing Restrictions: No      Mobility  Bed Mobility Overal bed mobility: Independent                  Transfers Overall transfer level: Independent                      Ambulation/Gait Ambulation/Gait assistance: Independent                Stairs Stairs:  Yes Stairs assistance: Supervision Stair Management: Forwards;No rails Number of Stairs: 1 (x20) General stair comments: supervision for safety, good power up and over      Balance Overall balance assessment: Independent                                           Pertinent Vitals/Pain Pain Assessment: No/denies pain    Home Living Family/patient expects to be discharged to:: Private residence Living Arrangements: Other relatives Available Help at Discharge: Family Type of Home: House Home Access: Stairs to enter Entrance Stairs-Rails: None Entrance Stairs-Number of Steps: 2   Home Layout: One level Home Equipment: Agricultural consultant (2 wheels);Cane - single point;Shower seat;BSC/3in1      Prior Function Prior Level of Function : Independent/Modified Independent             Mobility Comments: works on the farm       Higher education careers adviser   Dominant Hand: Right    Extremity/Trunk Assessment   Upper Extremity Assessment Upper Extremity Assessment: RUE deficits/detail RUE Deficits / Details: s/p TEE unable to range but pt reports range is "good"    Lower Extremity Assessment Lower Extremity Assessment: RLE deficits/detail RLE Deficits / Details: s/p R TKA 11/22, ROM WFL, strength grossly 4/5  Communication   Communication: No difficulties  Cognition Arousal/Alertness: Awake/alert Behavior During Therapy: WFL for tasks assessed/performed Overall Cognitive Status: Within Functional Limits for tasks assessed                                          General Comments General comments (skin integrity, edema, etc.): VSS on RA    Exercises Other Exercises Other Exercises: step x 20   Assessment/Plan    PT Assessment Patient does not need any further PT services         PT Goals (Current goals can be found in the Care Plan section)  Acute Rehab PT Goals Patient Stated Goal: go home PT Goal Formulation: All assessment and  education complete, DC therapy     AM-PAC PT "6 Clicks" Mobility  Outcome Measure Help needed turning from your back to your side while in a flat bed without using bedrails?: None Help needed moving from lying on your back to sitting on the side of a flat bed without using bedrails?: None Help needed moving to and from a bed to a chair (including a wheelchair)?: None Help needed standing up from a chair using your arms (e.g., wheelchair or bedside chair)?: None Help needed to walk in hospital room?: None Help needed climbing 3-5 steps with a railing? : None 6 Click Score: 24    End of Session   Activity Tolerance: Patient tolerated treatment well Patient left: in bed;with call bell/phone within reach Nurse Communication: Mobility status PT Visit Diagnosis: Other abnormalities of gait and mobility (R26.89)    Time: 4196-2229 PT Time Calculation (min) (ACUTE ONLY): 22 min   Charges:   PT Evaluation $PT Eval Low Complexity: 1 Low          Evamaria Detore B. Beverely Risen PT, DPT Acute Rehabilitation Services Pager [phone] Office [phone]   Elon Alas Fleet 04/16/2021, 12:48 PM

## 2021-04-16 NOTE — Interval H&P Note (Signed)
History and Physical Interval Note:  04/16/2021 9:32 AM  Gary Ferguson  has presented today for surgery, with the diagnosis of heart failure.  The various methods of treatment have been discussed with the patient and family. After consideration of risks, benefits and other options for treatment, the patient has consented to  Procedure(s): RIGHT/LEFT HEART CATH AND CORONARY ANGIOGRAPHY (N/A) and possible coronary angioplasty as a surgical intervention.  The patient's history has been reviewed, patient examined, no change in status, stable for surgery.  I have reviewed the patient's chart and labs.  Questions were answered to the patient's satisfaction.     [name]

## 2021-04-16 NOTE — Progress Notes (Signed)
HOSPITAL MEDICINE OVERNIGHT EVENT NOTE    Notified by nursing the patient's blood pressure is 88/68.   Nursing reports no associated symptoms such as lightheadedness or lethargy.  Patient clinically has not changed.  Chart reviewed, patient has advanced cardiomyopathy with a severely depressed ejection fraction of 20% status post several days of diuretics.  Blood pressure this low is not uncommon with this ejection fraction.  In the absence of any new symptoms I have advised nursing to just continue to monitor and manage conservatively.  Marinda Elk  MD [facility]

## 2021-04-16 NOTE — Progress Notes (Signed)
PROGRESS NOTE    Gary Ferguson  KDT:[id number] DOB: 1955-12-02 DOA: 04/11/2021 PCP: Elfredia Nevins, MD    Chief Complaint  Patient presents with   Shortness of Breath    Brief Narrative:  Gary Ferguson is an 66 y.o. male with medical history significant for recent right knee surgery, arthritis, chronic back pain, history of DVT about 12 years ago, GERD, kidney stones, who presented to the hospital because of cough and shortness of breath.  He said cough started about a week before Christmas last year and has progressively worsened.  He also developed shortness of breath about a week ago.  However shortness of breath has progressively worsened.  This morning, he felt so short of breath he could barely do anything.  Tx from [facility] to [facility] for thoracentesis.  2D echo result revealed LVEF less than 20%.  Cardiology paged, Dr. Algie Coffer 04/13/2021, to assist with the management of new acute HFrEF.  The following day 04/14/2021, the writer paged [facility] for consultation.  Appreciate cardiology's assistance.  pt diuresed appropriately.  Holding lasix for low BP and mild bump in creatinine.   Assessment & Plan:   Principal Problem:   Pleural effusion, bilateral Active Problems:   Shortness of breath   Legally blind   Wound infection complicating hardware, subsequent encounter   Acute systolic CHF (congestive heart failure) (HCC)   Bilateral pleural effusions secondary to acute systolic heart failure Patient underwent paracentesis on 04/12/2021 by IR and 950 mL of fluid was removed. Echocardiogram revealed left ventricular ejection fraction of 20%, regional wall motion abnormalities, global hypokinesis with akinesis of the apical septum and lateral/anterior walls Patient appropriately diuresed with IV Lasix. Lasix on hold for slight worsening creatinine.  Continue strict intake and output and daily weights.  Cardiology on board and appreciate recommendations. Patient was started on Lasix and low-dose  Coreg. He underwent cardiac catheterization on Thursday, 04/16/2021, showing Moderate non-obstructive CAD Severe NICM EF < 20%.  Normal filling pressures and cardiac output. Plan for cardiac MRI later today.     History of infected hardware and lower back Continue with chronic ciprofloxacin.  S/p right TKA No new issues at this time.    Legal blindness No issues at this time.   Mild AKI probably from overdiuresis Hold the Lasix and continue to monitor. Creatinine stabilized at 1.2       DVT prophylaxis: (Lovenox/) Code Status: (Full Code) Family Communication: none. Disposition:   Status is: Inpatient  Remains inpatient appropriate because: further work up for cardiomyopathy.      Consultants:  CARDIOLOGY.   Procedures: cardiac cath on 03/16/2022 Antimicrobials:none.   Subjective: No new complaints.  Objective: Vitals:   04/16/21 1040 04/16/21 1055 04/16/21 1127 04/16/21 1506  BP:  105/60 (!) 100/56 (!) 95/54  Pulse:      Resp: 18   20  Temp: 97.8 F (36.6 C)   (!) 97.5 F (36.4 C)  TempSrc: Oral   Oral  SpO2:      Weight:      Height:        Intake/Output Summary (Last 24 hours) at 04/16/2021 1625 Last data filed at 04/16/2021 1622 Gross per 24 hour  Intake 720 ml  Output 2100 ml  Net -1380 ml    Filed Weights   04/14/21 0421 04/15/21 0424 04/16/21 0623  Weight: 92.5 kg 89.9 kg 89.6 kg    Examination:  General exam: Appears calm and comfortable  Respiratory system: Clear to auscultation. Respiratory effort normal.  Cardiovascular system: S1 & S2 heard, RRR. No JVD,No pedal edema. Gastrointestinal system: Abdomen is nondistended, soft and nontender. Normal bowel sounds heard. Central nervous system: Alert and oriented. No focal neurological deficits. Extremities: Symmetric 5 x 5 power. Skin: No rashes, lesions or ulcers Psychiatry:  Mood & affect appropriate.     Data Reviewed: I have personally reviewed following labs and imaging  studies  CBC: Recent Labs  Lab 04/11/21 0916 04/11/21 2133 04/14/21 0603 04/16/21 0941 04/16/21 0944 04/16/21 0946 04/16/21 0952  WBC 6.5 5.9 4.4  --   --   --   --   NEUTROABS 5.2  --   --   --   --   --   --   HGB 13.3 13.1 12.8* 11.6* 12.2* 12.2* 11.2*  HCT 41.0 39.0 38.3* 34.0* 36.0* 36.0* 33.0*  MCV 89.1 86.3 86.1  --   --   --   --   PLT 220 227 184  --   --   --   --      Basic Metabolic Panel: Recent Labs  Lab 04/11/21 2133 04/13/21 0236 04/14/21 0603 04/15/21 0243 04/16/21 0503 04/16/21 0941 04/16/21 0944 04/16/21 0946 04/16/21 0952  NA 140 136 137 139 135 144 141 144 144  K 3.5 3.6 3.7 4.0 3.8 3.5 3.9 3.5 3.4*  CL 111 103 106 106 108  --   --   --   --   CO2 21* 24 23 22  21*  --   --   --   --   GLUCOSE 136* 103* 100* 98 106*  --   --   --   --   BUN 20 19 17 22 23   --   --   --   --   CREATININE 1.13 1.19 1.09 1.29* 1.28*  --   --   --   --   CALCIUM 8.5* 8.7* 8.6* 8.6* 8.6*  --   --   --   --   MG  --   --  2.0 2.0  --   --   --   --   --   PHOS  --   --  4.3  --   --   --   --   --   --      GFR: Estimated Creatinine Clearance: 64.8 mL/min (A) (by C-G formula based on SCr of 1.28 mg/dL (H)).  Liver Function Tests: Recent Labs  Lab 04/11/21 1536  AST 21  ALT 16  ALKPHOS 68  BILITOT 1.1  PROT 6.2*  ALBUMIN 3.7     CBG: No results for input(s): GLUCAP in the last 168 hours.   Recent Results (from the past 240 hour(s))  Resp Panel by RT-PCR (Flu A&B, Covid) Nasopharyngeal Swab     Status: None   Collection Time: 04/11/21  9:13 AM   Specimen: Nasopharyngeal Swab; Nasopharyngeal(NP) swabs in vial transport medium  Result Value Ref Range Status   SARS Coronavirus 2 by RT PCR NEGATIVE NEGATIVE Final    Comment: (NOTE) SARS-CoV-2 target nucleic acids are NOT DETECTED.  The SARS-CoV-2 RNA is generally detectable in upper respiratory specimens during the acute phase of infection. The lowest concentration of SARS-CoV-2 viral copies this  assay can detect is 138 copies/mL. A negative result does not preclude SARS-Cov-2 infection and should not be used as the sole basis for treatment or other patient management decisions. A negative result may occur with  improper specimen collection/handling, submission of specimen  other than nasopharyngeal swab, presence of viral mutation(s) within the areas targeted by this assay, and inadequate number of viral copies(<138 copies/mL). A negative result must be combined with clinical observations, patient history, and epidemiological information. The expected result is Negative.  Fact Sheet for Patients:  BloggerCourse.com  Fact Sheet for Healthcare Providers:  SeriousBroker.it  This test is no t yet approved or cleared by the [place] FDA and  has been authorized for detection and/or diagnosis of SARS-CoV-2 by FDA under an Emergency Use Authorization (EUA). This EUA will remain  in effect (meaning this test can be used) for the duration of the COVID-19 declaration under Section 564(b)(1) of the Act, 21 U.S.C.section 360bbb-3(b)(1), unless the authorization is terminated  or revoked sooner.       Influenza A by PCR NEGATIVE NEGATIVE Final   Influenza B by PCR NEGATIVE NEGATIVE Final    Comment: (NOTE) The Xpert Xpress SARS-CoV-2/FLU/RSV plus assay is intended as an aid in the diagnosis of influenza from Nasopharyngeal swab specimens and should not be used as a sole basis for treatment. Nasal washings and aspirates are unacceptable for Xpert Xpress SARS-CoV-2/FLU/RSV testing.  Fact Sheet for Patients: BloggerCourse.com  Fact Sheet for Healthcare Providers: SeriousBroker.it  This test is not yet approved or cleared by the [place] FDA and has been authorized for detection and/or diagnosis of SARS-CoV-2 by FDA under an Emergency Use Authorization (EUA). This EUA will  remain in effect (meaning this test can be used) for the duration of the COVID-19 declaration under Section 564(b)(1) of the Act, 21 U.S.C. section 360bbb-3(b)(1), unless the authorization is terminated or revoked.  Performed at [facility], [address], [place], [place] [address]   MRSA Next Gen by PCR, Nasal     Status: None   Collection Time: 04/12/21  5:10 AM   Specimen: Nasal Mucosa; Nasal Swab  Result Value Ref Range Status   MRSA by PCR Next Gen NOT DETECTED NOT DETECTED Final    Comment: (NOTE) The GeneXpert MRSA Assay (FDA approved for NASAL specimens only), is one component of a comprehensive MRSA colonization surveillance program. It is not intended to diagnose MRSA infection nor to guide or monitor treatment for MRSA infections. Test performance is not FDA approved in patients less than 78 years old. Performed at [facility], [address] [place], [place] [address]   Culture, body fluid w Gram Stain-bottle     Status: None (Preliminary result)   Collection Time: 04/12/21 11:57 AM   Specimen: Pleura  Result Value Ref Range Status   Specimen Description PLEURAL RIGHT LUNG  Final   Special Requests NONE  Final   Culture   Final    NO GROWTH 4 DAYS Performed at [facility], [address] [place], [place] [address]    Report Status PENDING  Incomplete  Gram stain     Status: None   Collection Time: 04/12/21 11:57 AM   Specimen: Pleura  Result Value Ref Range Status   Specimen Description PLEURAL RIGHT LUNG  Final   Special Requests NONE  Final   Gram Stain   Final    WBC PRESENT,BOTH PMN AND MONONUCLEAR NO ORGANISMS SEEN CYTOSPIN SMEAR Performed at [facility], [address] [place], [place] [address]    Report Status 04/12/2021 FINAL  Final          Radiology Studies: CARDIAC CATHETERIZATION  Result Date: 04/16/2021   1st [name] lesion is 30% stenosed.  1st Diag lesion is 70% stenosed.   Prox LAD to Mid LAD lesion is 40%  stenosed.   Prox RCA to Mid RCA lesion is 30% stenosed.   The left ventricular ejection fraction is less than 25% by visual estimate. Findings: Ao = 85/60 (70) LV = 88/19 RA = 3 RV = 26/4 PA = 29/11 (20) PCW = 16 Fick cardiac output/index = 6.6/3.2 PVR = 0.6 WU SVR = 810 FA sat = 97% PA sat = 72%, 74% SVC sat = 73% Assessment: 1. Moderate non-obstructive CAD 2. Severe NICM EF < 20% 3. Normal filling pressures and cardiac output Plan/Discussion: Will plan cMRI to further evaluate etiology of CM. Can refer to [facility] Clinic for further titration of HF meds. Arvilla Meres, MD 10:20 AM       Scheduled Meds:  aspirin EC  81 mg Oral Daily   carvedilol  3.125 mg Oral BID WC   ciprofloxacin  500 mg Oral BID   [START ON 04/17/2021] enoxaparin (LOVENOX) injection  40 mg Subcutaneous Q24H   lisinopril  2.5 mg Oral QHS   pantoprazole  40 mg Oral Daily   sodium chloride flush  3 mL Intravenous Q12H   sodium chloride flush  3 mL Intravenous Q12H   Continuous Infusions:  sodium chloride       LOS: 5 days        Kathlen Mody, MD [facility]   To contact the attending provider between 7A-7P or the covering provider during after hours 7P-7A, please log into the web site [email or web address] and access using universal [place] password for that web site. If you do not have the password, please call the hospital operator.  04/16/2021, 4:25 PM

## 2021-04-16 NOTE — Progress Notes (Signed)
° ° °  Subjective:  ° °BP continues to run low  °Ambulating in halls well  ° °Objective:  °Vitals:  ° 04/15/21 2051 04/16/21 0046 04/16/21 0349 04/16/21 0623  °BP: 95/66 113/67 105/85   °Pulse: 93 98 97   °Resp: 16 17 17   °Temp: 97.6 °F (36.4 °C) 97.7 °F (36.5 °C) 97.9 °F (36.6 °C)   °TempSrc: Oral Oral Oral   °SpO2: 95% 97% 96%   °Weight:    89.6 kg  °Height:      ° ° °Intake/Output from previous day: ° °Intake/Output Summary (Last 24 hours) at 04/16/2021 0818 °Last data filed at 04/16/2021 0400 °Gross per 24 hour  °Intake 720 ml  °Output 1725 ml  °Net -1005 ml  ° ° °Physical Exam: °Legally Blind  °No distress °Lungs clear °No murmur °Abdomen benign °No edema °Post right TKR  °Post lumbar fusion  ° °Lab Results: °Basic Metabolic Panel: °Recent Labs  °  04/14/21 °0603 04/15/21 °0243 04/16/21 °0503  °NA 137 139 135  °K 3.7 4.0 3.8  °CL 106 106 108  °CO2 23 22 21*  °GLUCOSE 100* 98 106*  °BUN 17 22 23  °CREATININE 1.09 1.29* 1.28*  °CALCIUM 8.6* 8.6* 8.6*  °MG 2.0 2.0  --   °PHOS 4.3  --   --   ° °Liver Function Tests: °No results for input(s): AST, ALT, ALKPHOS, BILITOT, PROT, ALBUMIN in the last 72 hours. °No results for input(s): LIPASE, AMYLASE in the last 72 hours. °CBC: °Recent Labs  °  04/14/21 °0603  °WBC 4.4  °HGB 12.8*  °HCT 38.3*  °MCV 86.1  °PLT 184  ° ° ° °Imaging: °No results found. ° °Cardiac Studies: ° ° Telemetry:  NSR 04/16/2021  ° Echo: EF < 20% global hypokinesis  ° °Medications: °  ° aspirin EC  81 mg Oral Daily  ° carvedilol  3.125 mg Oral BID WC  ° ciprofloxacin  500 mg Oral BID  ° enoxaparin (LOVENOX) injection  40 mg Subcutaneous Q24H  ° lisinopril  2.5 mg Oral QHS  ° pantoprazole  40 mg Oral Daily  ° sodium chloride flush  3 mL Intravenous Q12H  ° °  ° sodium chloride    ° sodium chloride 10 mL/hr at 04/16/21 0625  ° ° °Assessment/Plan:  ° °CHF: likely non ischemic He does not appear volume overloaded Lasix held and could not tolerate entesto due to low BP. Only on low dose core/zestril at night  For right and left heart cath today with DB.   °ID:  chronic cipro for hardware infection  °IVC filter:  previous DVT/PE °TKR:  good rehab right sided about 8 weeks ago  ° °Azoria Abbett °04/16/2021, 8:18 AM ° ° ° °

## 2021-04-16 NOTE — Progress Notes (Signed)
Pt. BP 88/64, repeat BP 86/68. Pt is asymptomatic. Dr. Leafy Half ( MD on call) was notified. Will continue to monitor pt.  04/16/21 2115  Vitals  BP (!) 86/68 (rechecked)  MAP (mmHg) 75  BP Location Left Arm  BP Method Automatic  Patient Position (if appropriate) Sitting  Pulse Rate 91  ECG Heart Rate 87  Resp 16  MEWS COLOR  MEWS Score Color Green  Oxygen Therapy  SpO2 97 %  O2 Device Room Air  Pain Assessment  Pain Scale 0-10  Pain Score 0  MEWS Score  MEWS Temp 0  MEWS Systolic 1  MEWS Pulse 0  MEWS RR 0  MEWS LOC 0  MEWS Score 1

## 2021-04-16 NOTE — Progress Notes (Signed)
PT Cancellation Note  Patient Details Name: Gary Ferguson MRN: [id number] DOB: Oct 15, 1955   Cancelled Treatment:    Reason Eval/Treat Not Completed: (P) Medical issues which prohibited therapy Pt is off floor for heart catheterization. PT will follow back for evaluation this afternoon as able.  Monae Topping B. Beverely Risen PT, DPT Acute Rehabilitation Services Pager [phone] Office [phone]     Elon Alas Fleet 04/16/2021, 9:29 AM

## 2021-04-16 NOTE — H&P (View-Only) (Signed)
° ° °  Subjective:   BP continues to run low  Ambulating in halls well   Objective:  Vitals:   04/15/21 2051 04/16/21 0046 04/16/21 0349 04/16/21 0623  BP: 95/66 113/67 105/85   Pulse: 93 98 97   Resp: 16 17 17    Temp: 97.6 F (36.4 C) 97.7 F (36.5 C) 97.9 F (36.6 C)   TempSrc: Oral Oral Oral   SpO2: 95% 97% 96%   Weight:    89.6 kg  Height:        Intake/Output from previous day:  Intake/Output Summary (Last 24 hours) at 04/16/2021 0818 Last data filed at 04/16/2021 0400 Gross per 24 hour  Intake 720 ml  Output 1725 ml  Net -1005 ml    Physical Exam: Legally Blind  No distress Lungs clear No murmur Abdomen benign No edema Post right TKR  Post lumbar fusion   Lab Results: Basic Metabolic Panel: Recent Labs    04/14/21 0603 04/15/21 0243 04/16/21 0503  NA 137 139 135  K 3.7 4.0 3.8  CL 106 106 108  CO2 23 22 21*  GLUCOSE 100* 98 106*  BUN 17 22 23   CREATININE 1.09 1.29* 1.28*  CALCIUM 8.6* 8.6* 8.6*  MG 2.0 2.0  --   PHOS 4.3  --   --    Liver Function Tests: No results for input(s): AST, ALT, ALKPHOS, BILITOT, PROT, ALBUMIN in the last 72 hours. No results for input(s): LIPASE, AMYLASE in the last 72 hours. CBC: Recent Labs    04/14/21 0603  WBC 4.4  HGB 12.8*  HCT 38.3*  MCV 86.1  PLT 184     Imaging: No results found.  Cardiac Studies:   Telemetry:  NSR 04/16/2021   Echo: EF < 20% global hypokinesis   Medications:    aspirin EC  81 mg Oral Daily   carvedilol  3.125 mg Oral BID WC   ciprofloxacin  500 mg Oral BID   enoxaparin (LOVENOX) injection  40 mg Subcutaneous Q24H   lisinopril  2.5 mg Oral QHS   pantoprazole  40 mg Oral Daily   sodium chloride flush  3 mL Intravenous Q12H      sodium chloride     sodium chloride 10 mL/hr at 04/16/21 06/14/2021    Assessment/Plan:   CHF: likely non ischemic He does not appear volume overloaded Lasix held and could not tolerate entesto due to low BP. Only on low dose core/zestril at night  For right and left heart cath today with DB.   ID:  chronic cipro for hardware infection  IVC filter:  previous DVT/PE TKR:  good rehab right sided about 8 weeks ago   06/14/21 04/16/2021, 8:18 AM

## 2021-04-17 DIAGNOSIS — I5021 Acute systolic (congestive) heart failure: Secondary | ICD-10-CM | POA: Diagnosis not present

## 2021-04-17 DIAGNOSIS — J90 Pleural effusion, not elsewhere classified: Secondary | ICD-10-CM | POA: Diagnosis not present

## 2021-04-17 LAB — BASIC METABOLIC PANEL
Anion gap: 9 (ref 5–15)
BUN: 21 mg/dL (ref 8–23)
CO2: 22 mmol/L (ref 22–32)
Calcium: 8.9 mg/dL (ref 8.9–10.3)
Chloride: 106 mmol/L (ref 98–111)
Creatinine, Ser: 1.26 mg/dL — ABNORMAL HIGH (ref 0.61–1.24)
GFR, Estimated: >60 mL/min (ref >60)
Glucose, Bld: 94 mg/dL (ref 70–99)
Potassium: 3.8 mmol/L (ref 3.5–5.1)
Sodium: 137 mmol/L (ref 135–145)

## 2021-04-17 LAB — MAGNESIUM: Magnesium: 2 mg/dL (ref 1.7–2.4)

## 2021-04-17 LAB — CULTURE, BODY FLUID W GRAM STAIN -BOTTLE: Culture: NO GROWTH

## 2021-04-17 LAB — HEMOGLOBIN A1C
Hgb A1c MFr Bld: 5.2 % (ref 4.8–5.6)
Mean Plasma Glucose: 102.54 mg/dL

## 2021-04-17 NOTE — Progress Notes (Signed)
PROGRESS NOTE    Gary Ferguson  [id number] DOB: [birth year] DOA: 04/11/2021 PCP: Redmond School, MD    Chief Complaint  Patient presents with   Shortness of Breath    Brief Narrative:  Gary Ferguson is an 66 y.o. male with medical history significant for recent right knee surgery, arthritis, chronic back pain, history of DVT about 12 years ago, GERD, kidney stones, who presented to the hospital because of cough and shortness of breath.  He said cough started about a week before Christmas last year and has progressively worsened.  He also developed shortness of breath about a week ago.  However shortness of breath has progressively worsened.  This morning, he felt so short of breath he could barely do anything.  Tx from [facility] to [facility] for thoracentesis.  2D echo result revealed LVEF less than 20%.  Cardiology paged, Dr. Doylene Canard 04/13/2021, to assist with the management of new acute HFrEF.  The following day 04/14/2021, the writer paged [facility] for consultation.  Appreciate cardiology's assistance.  pt diuresed appropriately.  Holding lasix for low BP and mild bump in creatinine.   Assessment & Plan:   Principal Problem:   Pleural effusion, bilateral Active Problems:   Shortness of breath   Legally blind   Wound infection complicating hardware, subsequent encounter   Acute systolic CHF (congestive heart failure) (HCC)   Bilateral pleural effusions secondary to acute systolic heart failure Patient underwent paracentesis on 04/12/2021 by IR and 950 mL of fluid was removed. Echocardiogram revealed left ventricular ejection fraction of 20%, regional wall motion abnormalities, global hypokinesis with akinesis of the apical septum and lateral/anterior walls Patient appropriately diuresed with IV Lasix. Lasix on hold for slight worsening creatinine.  Continue strict intake and output and daily weights.  Cardiology on board and appreciate recommendations. Patient was started on Lasix and low-dose  Coreg. He underwent cardiac catheterization on Thursday, 04/16/2021, showing Moderate non-obstructive CAD Severe NICM EF < 20%.  Normal filling pressures and cardiac output. Cardiac MRI showed no scar, . Medical management to continue.  Continue with lisinopril and coreg .     History of infected hardware and lower back Continue with chronic ciprofloxacin.  S/p right TKA No new issues at this time.    Legal blindness No issues at this time.   Mild AKI probably from overdiuresis Continue to  hold the lasix. .  Creatinine stabilized at 1.2       DVT prophylaxis: (Lovenox/)/ SCD' s PT is refusing.  Code Status: (Full Code) Family Communication: none. Disposition:   Status is: Inpatient  Remains inpatient appropriate because: further work up for cardiomyopathy.      Consultants:  CARDIOLOGY.   Procedures: cardiac cath on 03/16/2022 Antimicrobials:none.   Subjective: No new complaints.  Objective: Vitals:   04/17/21 0518 04/17/21 0754 04/17/21 0939 04/17/21 1200  BP: 103/72 93/68 98/66  103/73  Pulse: 86 94  94  Resp: 17 18  18   Temp: 97.8 F (36.6 C) 97.8 F (36.6 C)  (!) 97.5 F (36.4 C)  TempSrc: Oral Oral  Oral  SpO2: 93% 97%    Weight:      Height:        Intake/Output Summary (Last 24 hours) at 04/17/2021 1352 Last data filed at 04/17/2021 1300 Gross per 24 hour  Intake 480 ml  Output 1175 ml  Net -695 ml    Filed Weights   04/14/21 0421 04/15/21 0424 04/16/21 0623  Weight: 92.5 kg 89.9 kg 89.6 kg  Examination:  General exam: Appears calm and comfortable  Respiratory system: Clear to auscultation. Respiratory effort normal. Cardiovascular system: S1 & S2 heard, RRR. No JVD,  No pedal edema. Gastrointestinal system: Abdomen is nondistended, soft and nontender. . Normal bowel sounds heard. Central nervous system: Alert and oriented. No focal neurological deficits. Extremities: Symmetric 5 x 5 power. Skin: No rashes, lesions or  ulcers Psychiatry: Mood & affect appropriate.     Data Reviewed: I have personally reviewed following labs and imaging studies  CBC: Recent Labs  Lab 04/11/21 0916 04/11/21 2133 04/14/21 0603 04/16/21 0941 04/16/21 0944 04/16/21 0946 04/16/21 0952  WBC 6.5 5.9 4.4  --   --   --   --   NEUTROABS 5.2  --   --   --   --   --   --   HGB 13.3 13.1 12.8* 11.6* 12.2* 12.2* 11.2*  HCT 41.0 39.0 38.3* 34.0* 36.0* 36.0* 33.0*  MCV 89.1 86.3 86.1  --   --   --   --   PLT 220 227 184  --   --   --   --      Basic Metabolic Panel: Recent Labs  Lab 04/13/21 0236 04/14/21 0603 04/15/21 0243 04/16/21 0503 04/16/21 0941 04/16/21 0944 04/16/21 0946 04/16/21 0952 04/17/21 0352  NA 136 137 139 135 144 141 144 144 137  K 3.6 3.7 4.0 3.8 3.5 3.9 3.5 3.4* 3.8  CL 103 106 106 108  --   --   --   --  106  CO2 24 23 22  21*  --   --   --   --  22  GLUCOSE 103* 100* 98 106*  --   --   --   --  94  BUN 19 17 22 23   --   --   --   --  21  CREATININE 1.19 1.09 1.29* 1.28*  --   --   --   --  1.26*  CALCIUM 8.7* 8.6* 8.6* 8.6*  --   --   --   --  8.9  MG  --  2.0 2.0  --   --   --   --   --  2.0  PHOS  --  4.3  --   --   --   --   --   --   --      GFR: Estimated Creatinine Clearance: 65.8 mL/min (A) (by C-G formula based on SCr of 1.26 mg/dL (H)).  Liver Function Tests: Recent Labs  Lab 04/11/21 1536  AST 21  ALT 16  ALKPHOS 68  BILITOT 1.1  PROT 6.2*  ALBUMIN 3.7     CBG: No results for input(s): GLUCAP in the last 168 hours.   Recent Results (from the past 240 hour(s))  Resp Panel by RT-PCR (Flu A&B, Covid) Nasopharyngeal Swab     Status: None   Collection Time: 04/11/21  9:13 AM   Specimen: Nasopharyngeal Swab; Nasopharyngeal(NP) swabs in vial transport medium  Result Value Ref Range Status   SARS Coronavirus 2 by RT PCR NEGATIVE NEGATIVE Final    Comment: (NOTE) SARS-CoV-2 target nucleic acids are NOT DETECTED.  The SARS-CoV-2 RNA is generally detectable in upper  respiratory specimens during the acute phase of infection. The lowest concentration of SARS-CoV-2 viral copies this assay can detect is 138 copies/mL. A negative result does not preclude SARS-Cov-2 infection and should not be used as the sole basis for treatment or  other patient management decisions. A negative result may occur with  improper specimen collection/handling, submission of specimen other than nasopharyngeal swab, presence of viral mutation(s) within the areas targeted by this assay, and inadequate number of viral copies(<138 copies/mL). A negative result must be combined with clinical observations, patient history, and epidemiological information. The expected result is Negative.  Fact Sheet for Patients:  EntrepreneurPulse.com.au  Fact Sheet for Healthcare Providers:  IncredibleEmployment.be  This test is no t yet approved or cleared by the [place] FDA and  has been authorized for detection and/or diagnosis of SARS-CoV-2 by FDA under an Emergency Use Authorization (EUA). This EUA will remain  in effect (meaning this test can be used) for the duration of the COVID-19 declaration under Section 564(b)(1) of the Act, 21 U.S.C.section 360bbb-3(b)(1), unless the authorization is terminated  or revoked sooner.       Influenza A by PCR NEGATIVE NEGATIVE Final   Influenza B by PCR NEGATIVE NEGATIVE Final    Comment: (NOTE) The Xpert Xpress SARS-CoV-2/FLU/RSV plus assay is intended as an aid in the diagnosis of influenza from Nasopharyngeal swab specimens and should not be used as a sole basis for treatment. Nasal washings and aspirates are unacceptable for Xpert Xpress SARS-CoV-2/FLU/RSV testing.  Fact Sheet for Patients: EntrepreneurPulse.com.au  Fact Sheet for Healthcare Providers: IncredibleEmployment.be  This test is not yet approved or cleared by the [place] FDA and has been  authorized for detection and/or diagnosis of SARS-CoV-2 by FDA under an Emergency Use Authorization (EUA). This EUA will remain in effect (meaning this test can be used) for the duration of the COVID-19 declaration under Section 564(b)(1) of the Act, 21 U.S.C. section 360bbb-3(b)(1), unless the authorization is terminated or revoked.  Performed at [facility], [address] [place], [place] [address]   MRSA Next Gen by PCR, Nasal     Status: None   Collection Time: 04/12/21  5:10 AM   Specimen: Nasal Mucosa; Nasal Swab  Result Value Ref Range Status   MRSA by PCR Next Gen NOT DETECTED NOT DETECTED Final    Comment: (NOTE) The GeneXpert MRSA Assay (FDA approved for NASAL specimens only), is one component of a comprehensive MRSA colonization surveillance program. It is not intended to diagnose MRSA infection nor to guide or monitor treatment for MRSA infections. Test performance is not FDA approved in patients less than 53 years old. Performed at [facility], [place] [address] [place], [place] [address]   Culture, body fluid w Gram Stain-bottle     Status: None   Collection Time: 04/12/21 11:57 AM   Specimen: Pleura  Result Value Ref Range Status   Specimen Description PLEURAL RIGHT LUNG  Final   Special Requests NONE  Final   Culture   Final    NO GROWTH 5 DAYS Performed at [facility] [address], [place], [place] [address]    Report Status 04/17/2021 FINAL  Final  Gram stain     Status: None   Collection Time: 04/12/21 11:57 AM   Specimen: Pleura  Result Value Ref Range Status   Specimen Description PLEURAL RIGHT LUNG  Final   Special Requests NONE  Final   Gram Stain   Final    WBC PRESENT,BOTH PMN AND MONONUCLEAR NO ORGANISMS SEEN CYTOSPIN SMEAR Performed at [facility], [address], [place], [place] [address]    Report Status 04/12/2021 FINAL  Final          Radiology Studies:  CARDIAC CATHETERIZATION  Result Date: 04/16/2021    1st [name] lesion is 30% stenosed.   1st Diag lesion is 70% stenosed.   Prox LAD to Mid LAD lesion is 40% stenosed.   Prox RCA to Mid RCA lesion is 30% stenosed.   The left ventricular ejection fraction is less than 25% by visual estimate. Findings: Ao = 85/60 (70) LV = 88/19 RA = 3 RV = 26/4 PA = 29/11 (20) PCW = 16 Fick cardiac output/index = 6.6/3.2 PVR = 0.6 WU SVR = 810 FA sat = 97% PA sat = 72%, 74% SVC sat = 73% Assessment: 1. Moderate non-obstructive CAD 2. Severe NICM EF < 20% 3. Normal filling pressures and cardiac output Plan/Discussion: Will plan cMRI to further evaluate etiology of CM. Can refer to HF Clinic for further titration of HF meds. Glori Bickers, MD 10:20 AM  MR CARDIAC MORPHOLOGY W WO CONTRAST  Result Date: 04/16/2021 CLINICAL DATA:  68M presents with heart failure. EF <20% on echo. LHC showed moderate nonobstructive CAD. EXAM: CARDIAC MRI TECHNIQUE: The patient was scanned on a 1.5 Tesla Siemens magnet. A dedicated cardiac coil was used. Functional imaging was done using Fiesta sequences. 2,3, and 4 chamber views were done to assess for RWMA's. Modified Simpson's rule using a short axis stack was used to calculate an ejection fraction on a dedicated work Conservation officer, nature. The patient received 10 cc of Gadavist. After 10 minutes inversion recovery sequences were used to assess for infiltration and scar tissue. CONTRAST:  10 cc  of Gadavist FINDINGS: Left ventricle: -Severe dilatation -Severe systolic dysfunction -Mild nonspecific ECV elevation (30%) -No LGE LV EF: 22% (Normal 56-78%) Absolute volumes: LV EDV: 349mL (Normal 77-195 mL) LV ESV: 263mL (Normal 19-72 mL) LV SV: 20mL (Normal 51-133 mL) CO: 6.0L/min (Normal 2.8-8.8 L/min) Indexed volumes: LV EDV: 138mL/sq-m (Normal 47-92 mL/sq-m) LV ESV: 17mL/sq-m (Normal 13-30 mL/sq-m) LV SV: 41mL/sq-m (Normal 32-62 mL/sq-m) CI: 2.9L/min/sq-m (Normal 1.7-4.2 L/min/sq-m) Right ventricle: Normal RV size and systolic function RV EF:   48% (Normal 47-74%) Absolute volumes: RV EDV: 139mL (Normal 88-227 mL) RV ESV: 51mL (Normal 23-103 mL) RV SV: 64mL (Normal 52-138 mL) CO: 7.0L/min (Normal 2.8-8.8 L/min) Indexed volumes: RV EDV: 8mL/sq-m (Normal 55-105 mL/sq-m) RV ESV: 43mL/sq-m (Normal 15-43 mL/sq-m) RV SV: 60mL/sq-m (Normal 32-64 mL/sq-m) CI: 3.3L/min/sq-m (Normal 1.7-4.2 L/min/sq-m) Left atrium:Moderate enlargement Right atrium: Normal size Mitral valve: Mild regurgitation Aortic valve: No regurgitation Tricuspid valve: Trivial regurgitation Pulmonic valve: No regurgitation Aorta: Dilated ascending aorta measuring 2mm Pericardium: Small effusion Extracardiac structures: Small left pleural effusion IMPRESSION: 1.  Severe LV dilatation with severe systolic dysfunction (EF 99991111) 2.  Normal RV size and systolic function (EF [id number]) 3.  No late gadolinium enhancement to suggest myocardial scar Electronically Signed   By: Oswaldo Milian M.D.   On: 04/16/2021 22:15        Scheduled Meds:  aspirin EC  81 mg Oral Daily   carvedilol  3.125 mg Oral BID WC   ciprofloxacin  500 mg Oral BID   enoxaparin (LOVENOX) injection  40 mg Subcutaneous Q24H   lisinopril  2.5 mg Oral QHS   pantoprazole  40 mg Oral Daily   sodium chloride flush  3 mL Intravenous Q12H   sodium chloride flush  3 mL Intravenous Q12H   Continuous Infusions:  sodium chloride       LOS: 6 days        Hosie Poisson, MD [facility]   To contact the attending  provider between 7A-7P or the covering provider during after hours 7P-7A, please log into the web site [email or web address] and access using universal [place] password for that web site. If you do not have the password, please call the hospital operator.  04/17/2021, 1:52 PM

## 2021-04-17 NOTE — Progress Notes (Signed)
Patient refusing lovenox sq.  Explained risk of blood clots. Patient continues to refuse.  Dr. Blake Divine notified.

## 2021-04-17 NOTE — Progress Notes (Signed)
Cardiology:  Gary Ferguson  Subjective:   Feels well no dizziness Dyspnea improved   Objective:  Vitals:   04/16/21 2115 04/17/21 0049 04/17/21 0518 04/17/21 0754  BP: (!) 86/68 90/63 103/72 93/68  Pulse: 91 90 86 94  Resp: 16 16 17 18   Temp:  97.7 F (36.5 C) 97.8 F (36.6 C) 97.8 F (36.6 C)  TempSrc:  Oral Oral Oral  SpO2: 97% 93% 93% 97%  Weight:      Height:        Intake/Output from previous day:  Intake/Output Summary (Last 24 hours) at 04/17/2021 0835 Last data filed at 04/17/2021 0755 Gross per 24 hour  Intake 240 ml  Output 1100 ml  Net -860 ml    Physical Exam: Legally Blind  No distress Lungs clear No murmur Abdomen benign No edema Post right TKR  Post lumbar fusion   Lab Results: Basic Metabolic Panel: Recent Labs    04/15/21 0243 04/16/21 0503 04/16/21 0941 04/16/21 0952 04/17/21 0352  NA 139 135   < > 144 137  K 4.0 3.8   < > 3.4* 3.8  CL 106 108  --   --  106  CO2 22 21*  --   --  22  GLUCOSE 98 106*  --   --  94  BUN 22 23  --   --  21  CREATININE 1.29* 1.28*  --   --  1.26*  CALCIUM 8.6* 8.6*  --   --  8.9  MG 2.0  --   --   --  2.0   < > = values in this interval not displayed.   Liver Function Tests: No results for input(s): AST, ALT, ALKPHOS, BILITOT, PROT, ALBUMIN in the last 72 hours. No results for input(s): LIPASE, AMYLASE in the last 72 hours. CBC: Recent Labs    04/16/21 0946 04/16/21 0952  HGB 12.2* 11.2*  HCT 36.0* 33.0*     Imaging: CARDIAC CATHETERIZATION  Result Date: 04/16/2021   1st [name] lesion is 30% stenosed.   1st Diag lesion is 70% stenosed.   Prox LAD to Mid LAD lesion is 40% stenosed.   Prox RCA to Mid RCA lesion is 30% stenosed.   The left ventricular ejection fraction is less than 25% by visual estimate. Findings: Ao = 85/60 (70) LV = 88/19 RA = 3 RV = 26/4 PA = 29/11 (20) PCW = 16 Fick cardiac output/index = 6.6/3.2 PVR = 0.6 WU SVR = 810 FA sat = 97% PA sat = 72%, 74% SVC sat = 73% Assessment: 1.  Moderate non-obstructive CAD 2. Severe NICM EF < 20% 3. Normal filling pressures and cardiac output Plan/Discussion: Will plan cMRI to further evaluate etiology of CM. Can refer to HF Clinic for further titration of HF meds. 06/14/2021, MD 10:20 AM  MR CARDIAC MORPHOLOGY W WO CONTRAST  Result Date: 04/16/2021 CLINICAL DATA:  [name] presents with heart failure. EF <20% on echo. LHC showed moderate nonobstructive CAD. EXAM: CARDIAC MRI TECHNIQUE: The patient was scanned on a 1.5 Tesla Siemens magnet. A dedicated cardiac coil was used. Functional imaging was done using Fiesta sequences. 2,3, and 4 chamber views were done to assess for RWMA's. Modified Simpson's rule using a short axis stack was used to calculate an ejection fraction on a dedicated work 06/14/2021. The patient received 10 cc of Gadavist. After 10 minutes inversion recovery sequences were used to assess for infiltration and scar tissue. CONTRAST:  10 cc  of Gadavist FINDINGS: Left ventricle: -Severe dilatation -Severe systolic dysfunction -Mild nonspecific ECV elevation (30%) -No LGE LV EF: 22% (Normal 56-78%) Absolute volumes: LV EDV: (Normal 77-195 mL) LV ESV: (Normal 19-72 mL) LV SV: 24mL (Normal 51-133 mL) CO: 6.0L/min (Normal 2.8-8.8 L/min) Indexed volumes: LV EDV: 135mL/sq-m (Normal 47-92 mL/sq-m) LV ESV: 129mL/sq-m (Normal 13-30 mL/sq-m) LV SV: 35mL/sq-m (Normal 32-62 mL/sq-m) CI: 2.9L/min/sq-m (Normal 1.7-4.2 L/min/sq-m) Right ventricle: Normal RV size and systolic function RV EF:  48% (Normal 47-74%) Absolute volumes: RV EDV: (Normal 88-227 mL) RV ESV: 2mL (Normal 23-103 mL) RV SV: 65mL (Normal 52-138 mL) CO: 7.0L/min (Normal 2.8-8.8 L/min) Indexed volumes: RV EDV: 15mL/sq-m (Normal 55-105 mL/sq-m) RV ESV: 87mL/sq-m (Normal 15-43 mL/sq-m) RV SV: 57mL/sq-m (Normal 32-64 mL/sq-m) CI: 3.3L/min/sq-m (Normal 1.7-4.2 L/min/sq-m) Left atrium:Moderate enlargement Right atrium: Normal size Mitral valve: Mild  regurgitation Aortic valve: No regurgitation Tricuspid valve: Trivial regurgitation Pulmonic valve: No regurgitation Aorta: Dilated ascending aorta measuring 23mm Pericardium: Small effusion Extracardiac structures: Small left pleural effusion IMPRESSION: 1.  Severe LV dilatation with severe systolic dysfunction (EF 22%) 2.  Normal RV size and systolic function (EF 48%) 3.  No late gadolinium enhancement to suggest myocardial scar Electronically Signed   By: Gary Ferguson M.D.   On: 04/16/2021 22:15    Cardiac Studies:   Telemetry:  NSR 04/17/2021   Echo: EF < 20% global hypokinesis   Medications:    aspirin EC  81 mg Oral Daily   carvedilol  3.125 mg Oral BID WC   ciprofloxacin  500 mg Oral BID   enoxaparin (LOVENOX) injection  40 mg Subcutaneous Q24H   lisinopril  2.5 mg Oral QHS   pantoprazole  40 mg Oral Daily   sodium chloride flush  3 mL Intravenous Q12H   sodium chloride flush  3 mL Intravenous Q12H      sodium chloride      Assessment/Plan:   CHF: likely non ischemic He does not appear volume overloaded Cath 04/16/21 with no CAD and PCWP 16 SVR 810 CO 6.6PA sat 72%  MRI with no scar EF 22% GDMT limited by low BP Tolerating low dose coreg and lisinipril Discussed PRN lasix for dyspnea and 3 lb weight gain  ID:  chronic cipro for hardware infection  IVC filter:  previous DVT/PE TKR:  good rehab right sided about 8 weeks ago   D/C in am if stable outpatient f/u CHF clinic and Dr Oneida Alar [facility] 04/17/2021, 8:35 AM

## 2021-04-17 NOTE — Progress Notes (Incomplete)
Heart Failure Stewardship Pharmacist Progress Note ° ° °PCP: Fusco, Lawrence, MD °PCP-Cardiologist: None  ° ° °HPI:  °65 yo M with PMH of DVT, arthritis, GERD, and recent RKA. He presented to the ED on 04/11/21 with shortness of breath, cough, and congestion. CXR significant for bilateral pleural effusions. An ECHO was done on 04/12/21 and LVEF <20%. R/LHC on 04/16/21 with moderate non obstructive CAD and normal filling pressures. ° °Current HF Medications: °Beta Blocker: carvedilol 3.125 mg BID °ACE/ARB/ARNI: lisinopril 2.5 mg qhs ° °Prior to admission HF Medications: °None ° °Pertinent Lab Values: °Serum creatinine 1.28, BUN 23, Potassium 3.4, Sodium 144, BNP 384, Magnesium 2.0 ° °Vital Signs: °Weight: 197 lbs (admission weight: 204 lbs) °Blood pressure: 100/70s  °Heart rate: 80-90s  °I/O: -1.1L yesterday; net -3.9L ° °Medication Assistance / Insurance Benefits Check: °Does the patient have prescription insurance?  Yes °Type of insurance plan: Medicare ° °Outpatient Pharmacy:  °Prior to admission outpatient pharmacy: [facility] °Is the patient willing to use [facility] pharmacy at discharge? Yes °Is the patient willing to transition their outpatient pharmacy to utilize a [facility] outpatient pharmacy?   Pending °  ° °Assessment: °1. Acute on chronic systolic CHF (EF <20%). NYHA class II symptoms °- Continue carvedilol 3.125 mg BID °- Off Entresto 24/26 mg BID with low BP. Continue lisinopril 2.5 mg qhs °- Consider spironolactone and SGLT2i post cath. Would check A1c prior to starting SGLT2i. °  °Plan: °1) Medication changes recommended at this time: °- Check A1c for SGLT2i ° °2) Patient assistance: °- Entresto copay $10.35 °- Farxiga/Jardiance copay $10.35 ° °3)  Education  °- To be completed prior to discharge ° °Danasha Melman Boothby, PharmD, BCPS °Heart Failure Stewardship Pharmacist °Phone [phone] ° ° °

## 2021-04-18 LAB — BASIC METABOLIC PANEL
Anion gap: 4 — ABNORMAL LOW (ref 5–15)
BUN: 21 mg/dL (ref 8–23)
CO2: 24 mmol/L (ref 22–32)
Calcium: 9 mg/dL (ref 8.9–10.3)
Chloride: 105 mmol/L (ref 98–111)
Creatinine, Ser: 1.2 mg/dL (ref 0.61–1.24)
GFR, Estimated: >60 mL/min (ref >60)
Glucose, Bld: 104 mg/dL — ABNORMAL HIGH (ref 70–99)
Potassium: 4 mmol/L (ref 3.5–5.1)
Sodium: 133 mmol/L — ABNORMAL LOW (ref 135–145)

## 2021-04-18 MED ORDER — LISINOPRIL 2.5 MG PO TABS: 2.5000 mg | ORAL_TABLET | Freq: Every day | ORAL | 2 refills | Status: DC

## 2021-04-18 MED ORDER — POLYETHYLENE GLYCOL 3350 17 G PO PACK: 17.0000 g | PACK | Freq: Every day | ORAL | 0 refills | Status: DC | PRN

## 2021-04-18 MED ORDER — CARVEDILOL 3.125 MG PO TABS: 3.1250 mg | ORAL_TABLET | Freq: Two times a day (BID) | ORAL | 2 refills | Status: DC

## 2021-04-18 NOTE — Discharge Summary (Signed)
Physician Discharge Summary  Gary Ferguson RKY:[id number] DOB: [birth year] DOA: 04/11/2021  PCP: Elfredia Nevins, MD  Admit date: 04/11/2021 Discharge date: 04/18/2021  Admitted From: Home.  Disposition:  Home.   Recommendations for Outpatient Follow-up:  Follow up with PCP in 1-2 weeks Please obtain BMP/CBC in one week Please follow up with cardiology as scheduled.   Discharge Condition:stable.  CODE STATUS:full code.  Diet recommendation: Heart Healthy  Brief/Interim Summary: Gary Ferguson is an 66 y.o. male with medical history significant for recent right knee surgery, arthritis, chronic back pain, history of DVT about 12 years ago, GERD, kidney stones, who presented to the hospital because of cough and shortness of breath.  He said cough started about a week before Christmas last year and has progressively worsened.  He also developed shortness of breath about a week ago.  However shortness of breath has progressively worsened.  This morning, he felt so short of breath he could barely do anything.  Tx from [facility] to [facility] for thoracentesis.  2D echo result revealed LVEF less than 20%.  Cardiology paged, Dr. Algie Coffer 04/13/2021, to assist with the management of new acute HFrEF.  The following day 04/14/2021, the writer paged [facility] for consultation.  Appreciate cardiology's assistance.  pt diuresed appropriately.  Holding lasix for low BP and mild bump in creatinine.   Discharge Diagnoses:  Principal Problem:   Pleural effusion, bilateral Active Problems:   Shortness of breath   Legally blind   Wound infection complicating hardware, subsequent encounter   Acute systolic CHF (congestive heart failure) (HCC)    Bilateral pleural effusions secondary to acute systolic heart failure Patient underwent paracentesis on 04/12/2021 by IR and 950 mL of fluid was removed. Echocardiogram revealed left ventricular ejection fraction of 20%, regional wall motion abnormalities, global hypokinesis with akinesis of  the apical septum and lateral/anterior walls Patient appropriately diuresed with IV Lasix. Lasix on hold for slight worsening creatinine.  Continue strict intake and output and daily weights.  Cardiology on board and appreciate recommendations. Patient was started on Lasix and low-dose Coreg. He underwent cardiac catheterization on Thursday, 04/16/2021, showing Moderate non-obstructive CAD Severe NICM EF < 20%.  Normal filling pressures and cardiac output. Cardiac MRI showed no scar, . Medical management to continue.  Continue with lisinopril and coreg .        History of infected hardware and lower back Continue with chronic ciprofloxacin.   S/p right TKA No new issues at this time.       Legal blindness No issues at this time.     Mild AKI probably from overdiuresis Continue to  hold the lasix. .  Creatinine stabilized at 1.2  Discharge Instructions  Discharge Instructions     (HEART FAILURE PATIENTS) Call MD:  Anytime you have any of the following symptoms: 1) 3 pound weight gain in 24 hours or 5 pounds in 1 week 2) shortness of breath, with or without a dry hacking cough 3) swelling in the hands, feet or stomach 4) if you have to sleep on extra pillows at night in order to breathe.   Complete by: As directed    Diet - low sodium heart healthy   Complete by: As directed    Increase activity slowly   Complete by: As directed       Allergies as of 04/18/2021       Reactions   Penicillins Other (See Comments)   REACTION UNSPECIFIED  Has patient had a PCN reaction causing immediate  rash, facial/tongue/throat swelling, SOB or lightheadedness with hypotension: unknown childhood allergy Has patient had a PCN reaction causing severe rash involving mucus membranes or skin necrosis: unsure Has patient had a PCN reaction that required hospitalization unsure Has patient had a PCN reaction occurring within the last 10 years: No If all of the above answers are "NO", then may  proceed with Cephalosporin use.        Medication List     STOP taking these medications    docusate sodium 100 MG capsule Commonly known as: Colace   HYDROcodone-acetaminophen 7.5-325 MG tablet Commonly known as: NORCO   meloxicam 7.5 MG tablet Commonly known as: MOBIC   ondansetron 4 MG tablet Commonly known as: ZOFRAN   senna 8.6 MG Tabs tablet Commonly known as: SENOKOT       TAKE these medications    aspirin EC 81 MG tablet Take 81 mg by mouth daily. Swallow whole.   carvedilol 3.125 MG tablet Commonly known as: COREG Take 1 tablet (3.125 mg total) by mouth 2 (two) times daily with a meal.   ciprofloxacin 500 MG tablet Commonly known as: CIPRO Take 500 mg by mouth 2 (two) times daily.   clotrimazole-betamethasone cream Commonly known as: LOTRISONE Apply 1 application topically 2 (two) times daily.   fexofenadine 60 MG tablet Commonly known as: ALLEGRA Take 60 mg by mouth 2 (two) times daily.   lisinopril 2.5 MG tablet Commonly known as: ZESTRIL Take 1 tablet (2.5 mg total) by mouth at bedtime.   multivitamin capsule Take 1 capsule by mouth daily.   OMEGA 3 500 PO Take 500 mg by mouth daily.   omeprazole 20 MG capsule Commonly known as: PRILOSEC Take 20 mg by mouth every morning.   polyethylene glycol 17 g packet Commonly known as: MIRALAX / GLYCOLAX Take 17 g by mouth daily as needed for mild constipation.        Follow-up Information     [facility]. Go to.   Specialty: Cardiology Why: Jan 18 @ 11AM for [facility] appt within [facility]. Bring all medications with you Garage Code: 1202 at [employer], off [address]. Contact information: [address] [id number] mc [address] [place] [address] [phone]        Elfredia Nevins, MD. Schedule an appointment as soon as possible for a visit in 1 week(s).   Specialty: Internal Medicine Contact  information: [address] [place] [address] [phone]                Allergies  Allergen Reactions   Penicillins Other (See Comments)    REACTION UNSPECIFIED  Has patient had a PCN reaction causing immediate rash, facial/tongue/throat swelling, SOB or lightheadedness with hypotension: unknown childhood allergy Has patient had a PCN reaction causing severe rash involving mucus membranes or skin necrosis: unsure Has patient had a PCN reaction that required hospitalization unsure Has patient had a PCN reaction occurring within the last 10 years: No If all of the above answers are "NO", then may proceed with Cephalosporin use.     Consultations: Cardiology.    Procedures/Studies: DG Chest 1 View  Result Date: 04/12/2021 CLINICAL DATA:  Short of breath. Status post thoracentesis, 950 mL removed. EXAM: CHEST  1 VIEW COMPARISON:  04/12/2021 at 8:46 a.m.  CT, 04/11/2021. FINDINGS: No visualized right pleural effusion.  No pneumothorax. Persistent opacity at the left lung base consistent with a small effusion with associated atelectasis. Prominent  vascular and interstitial markings, stable. IMPRESSION: 1. No residual right pleural effusion visualized. 2. No pneumothorax. 3. Persistent left lung base opacity consistent with a combination of pleural fluid and atelectasis. Electronically Signed   By: Amie Portland M.D.   On: 04/12/2021 12:02   DG Chest 2 View  Result Date: 04/12/2021 CLINICAL DATA:  Pleural effusion.  Shortness of breath. EXAM: CHEST - 2 VIEW COMPARISON:  CT scan April 11, 2021 FINDINGS: The patient has known bilateral pleural effusions seen on the CT scan from yesterday persists but appear to be relatively small on today's imaging. The cardiomediastinal silhouette is unremarkable. No pneumothorax. No nodules or masses. No focal infiltrates. IMPRESSION: Bilateral pleural effusions as above, left greater than right. These are smaller than expected based on  yesterday's CT scan. Electronically Signed   By: Gerome Sam III M.D.   On: 04/12/2021 09:58   CT Angio Chest PE W and/or Wo Contrast  Result Date: 04/11/2021 CLINICAL DATA:  Shortness of breath EXAM: CT ANGIOGRAPHY CHEST WITH CONTRAST TECHNIQUE: Multidetector CT imaging of the chest was performed using the standard protocol during bolus administration of intravenous contrast. Multiplanar CT image reconstructions and MIPs were obtained to evaluate the vascular anatomy. CONTRAST:  75mL OMNIPAQUE IOHEXOL 350 MG/ML SOLN COMPARISON:  None. FINDINGS: Cardiovascular: Satisfactory opacification of the pulmonary arteries to the segmental level. No evidence of pulmonary embolism. Heart is mildly enlarged. Coronary artery atherosclerotic calcifications. No pericardial effusion. Mediastinum/Nodes: No enlarged mediastinal, hilar, or axillary lymph nodes. Thyroid gland, trachea, and esophagus demonstrate no significant findings. Lungs/Pleura: Moderate-to-large bilateral pleural effusions with mild compressive atelectasis. No evidence of pneumonia or pulmonary edema. Upper Abdomen: No acute abnormality. Musculoskeletal: Partially imaged posterior spinal fusion at the thoracolumbar junction. No acute osseous abnormality. Review of the MIP images confirms the above findings. IMPRESSION: 1. No evidence of pulmonary embolism. 2. Moderate-to-large bilateral pleural effusions with compressive atelectasis. No evidence of pneumonia or pulmonary edema. 3. Mild cardiomegaly with coronary artery atherosclerotic disease. Electronically Signed   By: Larose Hires D.O.   On: 04/11/2021 10:59   CARDIAC CATHETERIZATION  Result Date: 04/16/2021   1st [name] lesion is 30% stenosed.   1st Diag lesion is 70% stenosed.   Prox LAD to Mid LAD lesion is 40% stenosed.   Prox RCA to Mid RCA lesion is 30% stenosed.   The left ventricular ejection fraction is less than 25% by visual estimate. Findings: Ao = 85/60 (70) LV = 88/19 RA = 3 RV = 26/4 PA =  29/11 (20) PCW = 16 Fick cardiac output/index = 6.6/3.2 PVR = 0.6 WU SVR = 810 FA sat = 97% PA sat = 72%, 74% SVC sat = 73% Assessment: 1. Moderate non-obstructive CAD 2. Severe NICM EF < 20% 3. Normal filling pressures and cardiac output Plan/Discussion: Will plan cMRI to further evaluate etiology of CM. Can refer to HF Clinic for further titration of HF meds. Arvilla Meres, MD 10:20 AM  MR CARDIAC MORPHOLOGY W WO CONTRAST  Result Date: 04/16/2021 CLINICAL DATA:  63M presents with heart failure. EF <20% on echo. LHC showed moderate nonobstructive CAD. EXAM: CARDIAC MRI TECHNIQUE: The patient was scanned on a 1.5 Tesla Siemens magnet. A dedicated cardiac coil was used. Functional imaging was done using Fiesta sequences. 2,3, and 4 chamber views were done to assess for RWMA's. Modified Simpson's rule using a short axis stack was used to calculate an ejection fraction on a dedicated work [employer]. The patient received 10 cc of  Gadavist. After 10 minutes inversion recovery sequences were used to assess for infiltration and scar tissue. CONTRAST:  10 cc  of Gadavist FINDINGS: Left ventricle: -Severe dilatation -Severe systolic dysfunction -Mild nonspecific ECV elevation (30%) -No LGE LV EF: 22% (Normal 56-78%) Absolute volumes: LV EDV: (Normal 77-195 mL) LV ESV: (Normal 19-72 mL) LV SV: 72mL (Normal 51-133 mL) CO: 6.0L/min (Normal 2.8-8.8 L/min) Indexed volumes: LV EDV: 1108mL/sq-m (Normal 47-92 mL/sq-m) LV ESV: 164mL/sq-m (Normal 13-30 mL/sq-m) LV SV: 85mL/sq-m (Normal 32-62 mL/sq-m) CI: 2.9L/min/sq-m (Normal 1.7-4.2 L/min/sq-m) Right ventricle: Normal RV size and systolic function RV EF:  48% (Normal 47-74%) Absolute volumes: RV EDV: (Normal 88-227 mL) RV ESV: 90mL (Normal 23-103 mL) RV SV: 84mL (Normal 52-138 mL) CO: 7.0L/min (Normal 2.8-8.8 L/min) Indexed volumes: RV EDV: 24mL/sq-m (Normal 55-105 mL/sq-m) RV ESV: 56mL/sq-m (Normal 15-43 mL/sq-m) RV SV: 44mL/sq-m (Normal  32-64 mL/sq-m) CI: 3.3L/min/sq-m (Normal 1.7-4.2 L/min/sq-m) Left atrium:Moderate enlargement Right atrium: Normal size Mitral valve: Mild regurgitation Aortic valve: No regurgitation Tricuspid valve: Trivial regurgitation Pulmonic valve: No regurgitation Aorta: Dilated ascending aorta measuring 40mm Pericardium: Small effusion Extracardiac structures: Small left pleural effusion IMPRESSION: 1.  Severe LV dilatation with severe systolic dysfunction (EF 22%) 2.  Normal RV size and systolic function (EF 48%) 3.  No late gadolinium enhancement to suggest myocardial scar Electronically Signed   By: Epifanio Lesches M.D.   On: 04/16/2021 22:15   ECHOCARDIOGRAM COMPLETE  Result Date: 04/12/2021    ECHOCARDIOGRAM REPORT   Patient Name:   RYDER MAN Date of Exam: 04/12/2021 Medical Rec #:  [id number]      Height:       70.0 in Accession #:    [id number]     Weight:       203.4 lb Date of Birth:  07-29-1955       BSA:          2.102 m Patient Age:    65 years       BP:           118/87 mmHg Patient Gender: M              HR:           109 bpm. Exam Location:  Inpatient Procedure: 2D Echo, Cardiac Doppler and Color Doppler Indications:    Dyspnea  History:        Patient has no prior history of Echocardiogram examinations. Hx                 DVT. GERD.  Sonographer:    Ross Ludwig RDCS (AE) Referring Phys: [id number] Lurene Shadow IMPRESSIONS  1. Left ventricular ejection fraction, by estimation, is <20%. The left ventricle has severely decreased function. The left ventricle demonstrates regional wall motion abnormalities (see scoring diagram/findings for description). The left ventricular internal cavity size was mildly dilated. Indeterminate diastolic filling due to E-A fusion.  2. Right ventricular systolic function is normal. The right ventricular size is normal.  3. Left atrial size was mildly dilated.  4. The mitral valve is grossly normal. Mild mitral valve regurgitation. No evidence of mitral stenosis.  5. The  aortic valve is tricuspid. Aortic valve regurgitation is not visualized. No aortic stenosis is present.  6. The inferior vena cava is dilated in size with >50% respiratory variability, suggesting right atrial pressure of 8 mmHg. Comparison(s): Prior images unable to be directly viewed, comparison made by report only. Per Care Everywhere, Echo 05/05/09: EF 45-50%, global hypokinesis with  akinesis of apical septum/lateral/anterior walls. Conclusion(s)/Recommendation(s): Severely reduced LVEF. No prior in our system to compare. Wall motion abnormalities as noted. Findings communicated with Dr. Benjamine Mola. FINDINGS  Left Ventricle: Left ventricular ejection fraction, by estimation, is <20%. The left ventricle has severely decreased function. The left ventricle demonstrates regional wall motion abnormalities. The left ventricular internal cavity size was mildly dilated. There is no left ventricular hypertrophy. Indeterminate diastolic filling due to E-A fusion.  LV Wall Scoring: The entire septum and entire apex are akinetic. The anterior wall, antero-lateral wall, inferior wall, and posterior wall are hypokinetic. Right Ventricle: The right ventricular size is normal. No increase in right ventricular wall thickness. Right ventricular systolic function is normal. Left Atrium: Left atrial size was mildly dilated. Right Atrium: Right atrial size was not well visualized. Pericardium: There is no evidence of pericardial effusion. Presence of epicardial fat layer. Mitral Valve: The mitral valve is grossly normal. Mild mitral valve regurgitation. No evidence of mitral valve stenosis. MV peak gradient, 6.1 mmHg. The mean mitral valve gradient is 3.0 mmHg. Tricuspid Valve: The tricuspid valve is grossly normal. Tricuspid valve regurgitation is trivial. No evidence of tricuspid stenosis. Aortic Valve: The aortic valve is tricuspid. Aortic valve regurgitation is not visualized. No aortic stenosis is present. Pulmonic Valve: The pulmonic  valve was not well visualized. Pulmonic valve regurgitation is not visualized. No evidence of pulmonic stenosis. Aorta: The aortic root, ascending aorta and aortic arch are all structurally normal, with no evidence of dilitation or obstruction. Venous: The inferior vena cava is dilated in size with greater than 50% respiratory variability, suggesting right atrial pressure of 8 mmHg. IAS/Shunts: The interatrial septum was not well visualized.  LEFT VENTRICLE PLAX 2D LVIDd:         5.00 cm LVIDs:         4.80 cm LV PW:         1.10 cm LV IVS:        1.10 cm LVOT diam:     2.40 cm LVOT Area:     4.52 cm  LV Volumes (MOD) LV vol d, MOD A2C: 268.0 ml LV vol d, MOD A4C: 225.0 ml LV vol s, MOD A2C: 194.0 ml LV vol s, MOD A4C: 179.0 ml LV SV MOD A2C:     74.0 ml LV SV MOD A4C:     225.0 ml LV SV MOD BP:      61.9 ml RIGHT VENTRICLE             IVC RV Basal diam:  3.40 cm     IVC diam: 2.10 cm RV S prime:     11.30 cm/s TAPSE (M-mode): 1.6 cm LEFT ATRIUM             Index LA diam:        3.60 cm 1.71 cm/m LA Vol (A2C):   48.2 ml 22.93 ml/m LA Vol (A4C):   58.1 ml 27.64 ml/m LA Biplane Vol: 56.3 ml 26.78 ml/m   AORTA Ao Root diam: 3.60 cm Ao Asc diam:  3.60 cm MITRAL VALVE MV Peak grad: 6.1 mmHg  SHUNTS MV Mean grad: 3.0 mmHg  Systemic Diam: 2.40 cm MV Vmax:      1.23 m/s MV Vmean:     71.6 cm/s Jodelle Red MD Electronically signed by Jodelle Red MD Signature Date/Time: 04/12/2021/6:56:39 PM    Final    US THORACENTESIS ASP PLEURAL SPACE W/IMG GUIDE  Result Date: 04/12/2021 INDICATION: Shortness of breath, bilateral pleural effusions  seen on chest x-ray. Request for therapeutic and diagnostic thoracentesis. EXAM: ULTRASOUND GUIDED RIGHT THORACENTESIS MEDICATIONS: 10 mL 1% lidocaine COMPLICATIONS: None immediate. PROCEDURE: An ultrasound guided thoracentesis was thoroughly discussed with the patient and questions answered. The benefits, risks, alternatives and complications were also discussed. The  patient understands and wishes to proceed with the procedure. Written consent was obtained. Ultrasound was performed to localize and mark an adequate pocket of fluid in the right chest. The area was then prepped and draped in the normal sterile fashion. 1% Lidocaine was used for local anesthesia. Under ultrasound guidance a 19 gauge, 7-cm, Yueh catheter was introduced. Thoracentesis was performed. The catheter was removed and a dressing applied. FINDINGS: A total of approximately 950 cc of hazy yellow fluid was removed. Samples were sent to the laboratory as requested by the clinical team. Post procedure chest X-ray reviewed, negative for pneumothorax. IMPRESSION: Successful ultrasound guided right thoracentesis yielding 950 cc of pleural fluid. Read by: Lawernce Ion, PA-C Electronically Signed   By: Judie Petit.  Shick M.D.   On: 04/12/2021 14:17      Subjective:  No chest pain or sob.  Discharge Exam: Vitals:   04/18/21 0511 04/18/21 0830  BP: 91/70 94/64  Pulse: 91   Resp: 16   Temp: 98.4 F (36.9 C)   SpO2: 93%    Vitals:   04/17/21 2054 04/17/21 2212 04/18/21 0511 04/18/21 0830  BP: 92/62 96/78 91/70  94/64  Pulse: 92  91   Resp: 16  16   Temp: 97.8 F (36.6 C)  98.4 F (36.9 C)   TempSrc: Oral  Oral   SpO2: 94%  93%   Weight:   89.6 kg   Height:        General: Pt is alert, awake, not in acute distress Cardiovascular: RRR, S1/S2 +, no rubs, no gallops Respiratory: CTA bilaterally, no wheezing, no rhonchi Abdominal: Soft, NT, ND, bowel sounds + Extremities: no edema, no cyanosis    The results of significant diagnostics from this hospitalization (including imaging, microbiology, ancillary and laboratory) are listed below for reference.     Microbiology: Recent Results (from the past 240 hour(s))  Resp Panel by RT-PCR (Flu A&B, Covid) Nasopharyngeal Swab     Status: None   Collection Time: 04/11/21  9:13 AM   Specimen: Nasopharyngeal Swab; Nasopharyngeal(NP) swabs in vial  transport medium  Result Value Ref Range Status   SARS Coronavirus 2 by RT PCR NEGATIVE NEGATIVE Final    Comment: (NOTE) SARS-CoV-2 target nucleic acids are NOT DETECTED.  The SARS-CoV-2 RNA is generally detectable in upper respiratory specimens during the acute phase of infection. The lowest concentration of SARS-CoV-2 viral copies this assay can detect is 138 copies/mL. A negative result does not preclude SARS-Cov-2 infection and should not be used as the sole basis for treatment or other patient management decisions. A negative result may occur with  improper specimen collection/handling, submission of specimen other than nasopharyngeal swab, presence of viral mutation(s) within the areas targeted by this assay, and inadequate number of viral copies(<138 copies/mL). A negative result must be combined with clinical observations, patient history, and epidemiological information. The expected result is Negative.  Fact Sheet for Patients:  06/09/21  Fact Sheet for Healthcare Providers:  BloggerCourse.com  This test is no t yet approved or cleared by the [employer] FDA and  has been authorized for detection and/or diagnosis of SARS-CoV-2 by FDA under an Emergency Use Authorization (EUA). This EUA will remain  in effect (meaning this  test can be used) for the duration of the COVID-19 declaration under Section 564(b)(1) of the Act, 21 U.S.C.section 360bbb-3(b)(1), unless the authorization is terminated  or revoked sooner.       Influenza A by PCR NEGATIVE NEGATIVE Final   Influenza B by PCR NEGATIVE NEGATIVE Final    Comment: (NOTE) The Xpert Xpress SARS-CoV-2/FLU/RSV plus assay is intended as an aid in the diagnosis of influenza from Nasopharyngeal swab specimens and should not be used as a sole basis for treatment. Nasal washings and aspirates are unacceptable for Xpert Xpress SARS-CoV-2/FLU/RSV testing.  Fact  Sheet for Patients: BloggerCourse.com  Fact Sheet for Healthcare Providers: SeriousBroker.it  This test is not yet approved or cleared by the [place] FDA and has been authorized for detection and/or diagnosis of SARS-CoV-2 by FDA under an Emergency Use Authorization (EUA). This EUA will remain in effect (meaning this test can be used) for the duration of the COVID-19 declaration under Section 564(b)(1) of the Act, 21 U.S.C. section 360bbb-3(b)(1), unless the authorization is terminated or revoked.  Performed at [facility], [address], [place], [place] [address]   MRSA Next Gen by PCR, Nasal     Status: None   Collection Time: 04/12/21  5:10 AM   Specimen: Nasal Mucosa; Nasal Swab  Result Value Ref Range Status   MRSA by PCR Next Gen NOT DETECTED NOT DETECTED Final    Comment: (NOTE) The GeneXpert MRSA Assay (FDA approved for NASAL specimens only), is one component of a comprehensive MRSA colonization surveillance program. It is not intended to diagnose MRSA infection nor to guide or monitor treatment for MRSA infections. Test performance is not FDA approved in patients less than 16 years old. Performed at [facility], [address] [place], [place] [address]   Culture, body fluid w Gram Stain-bottle     Status: None   Collection Time: 04/12/21 11:57 AM   Specimen: Pleura  Result Value Ref Range Status   Specimen Description PLEURAL RIGHT LUNG  Final   Special Requests NONE  Final   Culture   Final    NO GROWTH 5 DAYS Performed at [facility], [address], [place], [place] [address]    Report Status 04/17/2021 FINAL  Final  Gram stain     Status: None   Collection Time: 04/12/21 11:57 AM   Specimen: Pleura  Result Value Ref Range Status   Specimen Description PLEURAL RIGHT LUNG  Final   Special Requests NONE  Final   Gram Stain   Final    WBC PRESENT,BOTH PMN AND MONONUCLEAR NO ORGANISMS  SEEN CYTOSPIN SMEAR Performed at [facility], [address] [place], [place] [address]    Report Status 04/12/2021 FINAL  Final     Labs: BNP (last 3 results) Recent Labs    04/11/21 1536  BNP 384.0*   Basic Metabolic Panel: Recent Labs  Lab 04/13/21 0236 04/14/21 0603 04/15/21 0243 04/16/21 0503 04/16/21 0941 04/16/21 0944 04/16/21 0946 04/16/21 0952 04/17/21 0352  NA 136 137 139 135 144 141 144 144 137  K 3.6 3.7 4.0 3.8 3.5 3.9 3.5 3.4* 3.8  CL 103 106 106 108  --   --   --   --  106  CO2 21*  --   --   --   --  22  GLUCOSE 103* 100* 98 106*  --   --   --   --  94  BUN 19 17 22 23   --   --   --   --  21  CREATININE 1.19 1.09 1.29* 1.28*  --   --   --   --  1.26*  CALCIUM 8.7* 8.6* 8.6* 8.6*  --   --   --   --  8.9  MG  --  2.0 2.0  --   --   --   --   --  2.0  PHOS  --  4.3  --   --   --   --   --   --   --    Liver Function Tests: Recent Labs  Lab 04/11/21 1536  AST 21  ALT 16  ALKPHOS 68  BILITOT 1.1  PROT 6.2*  ALBUMIN 3.7   No results for input(s): LIPASE, AMYLASE in the last 168 hours. No results for input(s): AMMONIA in the last 168 hours. CBC: Recent Labs  Lab 04/11/21 2133 04/14/21 0603 04/16/21 0941 04/16/21 0944 04/16/21 0946 04/16/21 0952  WBC 5.9 4.4  --   --   --   --   HGB 13.1 12.8* 11.6* 12.2* 12.2* 11.2*  HCT 39.0 38.3* 34.0* 36.0* 36.0* 33.0*  MCV 86.3 86.1  --   --   --   --   PLT 227 184  --   --   --   --    Cardiac Enzymes: No results for input(s): CKTOTAL, CKMB, CKMBINDEX, TROPONINI in the last 168 hours. BNP: Invalid input(s): POCBNP CBG: No results for input(s): GLUCAP in the last 168 hours. D-Dimer No results for input(s): DDIMER in the last 72 hours. Hgb A1c Recent Labs    04/17/21 0352  HGBA1C 5.2   Lipid Profile No results for input(s): CHOL, HDL, LDLCALC, TRIG, CHOLHDL, LDLDIRECT in the last 72 hours. Thyroid function studies No results for input(s): TSH, T4TOTAL, T3FREE, THYROIDAB in  the last 72 hours.  Invalid input(s): FREET3 Anemia work up No results for input(s): VITAMINB12, FOLATE, FERRITIN, TIBC, IRON, RETICCTPCT in the last 72 hours. Urinalysis    Component Value Date/Time   COLORURINE AMBER (A) 11/18/2012 2002   APPEARANCEUR CLEAR 11/18/2012 2002   LABSPEC 1.020 11/18/2012 2002   PHURINE 6.0 11/18/2012 2002   GLUCOSEU NEGATIVE 11/18/2012 2002   HGBUR NEGATIVE 11/18/2012 2002   BILIRUBINUR NEGATIVE 11/18/2012 2002   KETONESUR NEGATIVE 11/18/2012 2002   PROTEINUR NEGATIVE 11/18/2012 2002   UROBILINOGEN 0.2 11/18/2012 2002   NITRITE NEGATIVE 11/18/2012 2002   LEUKOCYTESUR NEGATIVE 11/18/2012 2002   Sepsis Labs Invalid input(s): PROCALCITONIN,  WBC,  LACTICIDVEN Microbiology Recent Results (from the past 240 hour(s))  Resp Panel by RT-PCR (Flu A&B, Covid) Nasopharyngeal Swab     Status: None   Collection Time: 04/11/21  9:13 AM   Specimen: Nasopharyngeal Swab; Nasopharyngeal(NP) swabs in vial transport medium  Result Value Ref Range Status   SARS Coronavirus 2 by RT PCR NEGATIVE NEGATIVE Final    Comment: (NOTE) SARS-CoV-2 target nucleic acids are NOT DETECTED.  The SARS-CoV-2 RNA is generally detectable in upper respiratory specimens during the acute phase of infection. The lowest concentration of SARS-CoV-2 viral copies this assay can detect is 138 copies/mL. A negative result does not preclude SARS-Cov-2 infection and should not be used as the sole basis for treatment or other patient management decisions. A negative result may occur with  improper specimen collection/handling, submission of specimen other than nasopharyngeal swab, presence of viral mutation(s) within the areas targeted by this assay, and inadequate  number of viral copies(<138 copies/mL). A negative result must be combined with clinical observations, patient history, and epidemiological information. The expected result is Negative.  Fact Sheet for Patients:   BloggerCourse.com  Fact Sheet for Healthcare Providers:  SeriousBroker.it  This test is no t yet approved or cleared by the [place] FDA and  has been authorized for detection and/or diagnosis of SARS-CoV-2 by FDA under an Emergency Use Authorization (EUA). This EUA will remain  in effect (meaning this test can be used) for the duration of the COVID-19 declaration under Section 564(b)(1) of the Act, 21 U.S.C.section 360bbb-3(b)(1), unless the authorization is terminated  or revoked sooner.       Influenza A by PCR NEGATIVE NEGATIVE Final   Influenza B by PCR NEGATIVE NEGATIVE Final    Comment: (NOTE) The Xpert Xpress SARS-CoV-2/FLU/RSV plus assay is intended as an aid in the diagnosis of influenza from Nasopharyngeal swab specimens and should not be used as a sole basis for treatment. Nasal washings and aspirates are unacceptable for Xpert Xpress SARS-CoV-2/FLU/RSV testing.  Fact Sheet for Patients: BloggerCourse.com  Fact Sheet for Healthcare Providers: SeriousBroker.it  This test is not yet approved or cleared by the [place] FDA and has been authorized for detection and/or diagnosis of SARS-CoV-2 by FDA under an Emergency Use Authorization (EUA). This EUA will remain in effect (meaning this test can be used) for the duration of the COVID-19 declaration under Section 564(b)(1) of the Act, 21 U.S.C. section 360bbb-3(b)(1), unless the authorization is terminated or revoked.  Performed at [facility], [address] [place], [place] [address]   MRSA Next Gen by PCR, Nasal     Status: None   Collection Time: 04/12/21  5:10 AM   Specimen: Nasal Mucosa; Nasal Swab  Result Value Ref Range Status   MRSA by PCR Next Gen NOT DETECTED NOT DETECTED Final    Comment: (NOTE) The GeneXpert MRSA Assay (FDA approved for NASAL specimens only), is one component of a  comprehensive MRSA colonization surveillance program. It is not intended to diagnose MRSA infection nor to guide or monitor treatment for MRSA infections. Test performance is not FDA approved in patients less than 64 years old. Performed at [facility], [address] [place], [place] [address]   Culture, body fluid w Gram Stain-bottle     Status: None   Collection Time: 04/12/21 11:57 AM   Specimen: Pleura  Result Value Ref Range Status   Specimen Description PLEURAL RIGHT LUNG  Final   Special Requests NONE  Final   Culture   Final    NO GROWTH 5 DAYS Performed at [facility], [address], [place], [place] [address]    Report Status 04/17/2021 FINAL  Final  Gram stain     Status: None   Collection Time: 04/12/21 11:57 AM   Specimen: Pleura  Result Value Ref Range Status   Specimen Description PLEURAL RIGHT LUNG  Final   Special Requests NONE  Final   Gram Stain   Final    WBC PRESENT,BOTH PMN AND MONONUCLEAR NO ORGANISMS SEEN CYTOSPIN SMEAR Performed at [facility], [address] [place], [place] [address]    Report Status 04/12/2021 FINAL  Final     Time coordinating discharge: 42 minutes.   SIGNED:   Kathlen Mody, MD  [facility] 04/18/2021, 10:22 AM

## 2021-04-18 NOTE — Progress Notes (Signed)
Mobility Specialist Progress Note    04/18/21 1038  Mobility  Activity Refused mobility   Pt waiting to d/c home.  [facility] Mobility Specialist  M.S. 2C and 6E: [phone] M.S. 4E: [phone]

## 2021-04-22 ENCOUNTER — Ambulatory Visit (HOSPITAL_COMMUNITY)
Admission: RE | Admit: 2021-04-22 | Discharge: 2021-04-22 | Disposition: A | Discharge status: Home / Self Care | Payer: No Typology Code available for payment source | Source: Ambulatory Visit | Attending: Adult Health | Admitting: Adult Health

## 2021-04-22 ENCOUNTER — Other Ambulatory Visit (HOSPITAL_COMMUNITY): Payer: Self-pay | Admitting: Adult Health

## 2021-04-22 ENCOUNTER — Ambulatory Visit (HOSPITAL_COMMUNITY)
Admit: 2021-04-22 | Discharge: 2021-04-22 | Disposition: A | Discharge status: Home / Self Care | Payer: No Typology Code available for payment source | Attending: Adult Health | Admitting: Adult Health

## 2021-04-22 ENCOUNTER — Other Ambulatory Visit: Payer: Self-pay

## 2021-04-22 ENCOUNTER — Telehealth (HOSPITAL_COMMUNITY): Payer: Self-pay

## 2021-04-22 VITALS — BP 102/75 | HR 87 | Wt 195.2 lb

## 2021-04-22 DIAGNOSIS — M549 Dorsalgia, unspecified: Secondary | ICD-10-CM | POA: Insufficient documentation

## 2021-04-22 DIAGNOSIS — Z79899 Other long term (current) drug therapy: Secondary | ICD-10-CM | POA: Diagnosis not present

## 2021-04-22 DIAGNOSIS — I493 Ventricular premature depolarization: Secondary | ICD-10-CM

## 2021-04-22 DIAGNOSIS — I5021 Acute systolic (congestive) heart failure: Secondary | ICD-10-CM | POA: Diagnosis not present

## 2021-04-22 DIAGNOSIS — M2550 Pain in unspecified joint: Secondary | ICD-10-CM | POA: Insufficient documentation

## 2021-04-22 DIAGNOSIS — I428 Other cardiomyopathies: Secondary | ICD-10-CM | POA: Diagnosis not present

## 2021-04-22 DIAGNOSIS — I959 Hypotension, unspecified: Secondary | ICD-10-CM | POA: Diagnosis not present

## 2021-04-22 DIAGNOSIS — Z7982 Long term (current) use of aspirin: Secondary | ICD-10-CM | POA: Insufficient documentation

## 2021-04-22 DIAGNOSIS — I2510 Atherosclerotic heart disease of native coronary artery without angina pectoris: Secondary | ICD-10-CM | POA: Diagnosis not present

## 2021-04-22 DIAGNOSIS — Z86718 Personal history of other venous thrombosis and embolism: Secondary | ICD-10-CM | POA: Diagnosis not present

## 2021-04-22 DIAGNOSIS — H547 Unspecified visual loss: Secondary | ICD-10-CM | POA: Diagnosis not present

## 2021-04-22 DIAGNOSIS — Z96659 Presence of unspecified artificial knee joint: Secondary | ICD-10-CM | POA: Insufficient documentation

## 2021-04-22 DIAGNOSIS — Z8249 Family history of ischemic heart disease and other diseases of the circulatory system: Secondary | ICD-10-CM | POA: Diagnosis not present

## 2021-04-22 LAB — MAGNESIUM: Magnesium: 2.3 mg/dL (ref 1.7–2.4)

## 2021-04-22 LAB — BASIC METABOLIC PANEL
Anion gap: 16 — ABNORMAL HIGH (ref 5–15)
BUN: 11 mg/dL (ref 8–23)
CO2: 23 mmol/L (ref 22–32)
Calcium: 10 mg/dL (ref 8.9–10.3)
Chloride: 103 mmol/L (ref 98–111)
Creatinine, Ser: 1.09 mg/dL (ref 0.61–1.24)
GFR, Estimated: >60 mL/min (ref >60)
Glucose, Bld: 102 mg/dL — ABNORMAL HIGH (ref 70–99)
Potassium: 3.9 mmol/L (ref 3.5–5.1)
Sodium: 142 mmol/L (ref 135–145)

## 2021-04-22 LAB — CBC
HCT: 40.4 % (ref 39.0–52.0)
Hemoglobin: 13.1 g/dL (ref 13.0–17.0)
MCH: 28 pg (ref 26.0–34.0)
MCHC: 32.4 g/dL (ref 30.0–36.0)
MCV: 86.3 fL (ref 80.0–100.0)
Platelets: 200 10*3/uL (ref 150–400)
RBC: 4.68 MIL/uL (ref 4.22–5.81)
RDW: 14.4 % (ref 11.5–15.5)
WBC: 4.7 10*3/uL (ref 4.0–10.5)
nRBC: 0 % (ref 0.0–0.2)

## 2021-04-22 LAB — TSH: TSH: 2.007 u[IU]/mL (ref 0.350–4.500)

## 2021-04-22 MED ORDER — FUROSEMIDE 20 MG PO TABS: 20.0000 mg | ORAL_TABLET | ORAL | 0 refills | Status: DC | PRN

## 2021-04-22 NOTE — Patient Instructions (Signed)
Take Furosemide 20mg  as needed  Labs done today, your results will be available in MyChart, we will contact you for abnormal readings.  If you have any questions, issues, or concerns before your next appointment please call our office at [phone], opt. 2 and leave a message for the triage nurse.  Thank you for allowing [phone] to provider your heart failure care after your recent hospitalization. Please follow-up with Dr [place]    Do the following things EVERYDAY: Weigh yourself in the morning before breakfast. Write it down and keep it in a log. Take your medicines as prescribed Eat low salt foods--Limit salt (sodium) to 2000 mg per day.  Stay as active as you can everyday Limit all fluids for the day to less than 2 liters

## 2021-04-22 NOTE — Progress Notes (Signed)
Advanced Heart Failure Follow up   Referring Physician: Dr Johnsie Cancel  Primary Care: Dr Gerarda Fraction Primary Cardiologist: Dr Johnsie Cancel  HF MD; Dr Haroldine Laws.   HPI: [name] is a 66 year old with history of blindness, DVT with IVC filter, TKR , and recently diagnosed HFrEF  04/2021.    Had knee replacement 02/19/21. Post op course uncomplicated.  Completed PT 2 weeks early. 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    Admitted 04/11/20 with increased dyspnea. ECHO with reduced EF < 20%. Transferred to [facility] for HF management/cath. Diuresed with IV lasix.  Cath with mod non obstructive cad and normal filling pressures/preserved CO. CMRI with EF < 22% RV normal and now LGE to suggest scar. GDMT limited due to soft BP. D/C weight 197 pounds.   Overall feeling fine. Having some fatigue. Denies SOB/PND/Orthopnea. Plans to start PT again later this week. SBP at home has been in 80s. He takes his BP every day. He held lisinopril a couple doses due hypotension with SBP < 80s. Appetite ok. No fever or chills. Weight at home 193 pounds. Taking all medications.  Has 108 year old grandson that lives with him. His Dad drives him to appointments. He ultimately would like follow up in [place].   Cardiac Testing  Echo 04/12/21 EF < 20%  CMRI < 22%. Normal RV. No LGE  to suggest scar.    Cath 04/16/2021   1st [name] lesion is 30% stenosed.   1st Diag lesion is 70% stenosed.   Prox LAD to Mid LAD lesion is 40% stenosed.   Prox RCA to Mid RCA lesion is 30% stenosed.   The left ventricular ejection fraction is less than 25% by visual estimate.  Findings:  Ao = 85/60 (70) LV = 88/19 RA = 3 RV = 26/4 PA = 29/11 (20) PCW = 16 Fick cardiac output/index = 6.6/3.2 PVR = 0.6 WU SVR = 810 FA sat = 97% PA sat = 72%, 74% SVC sat = 73%   Assessment:  1. Moderate non-obstructive CAD 2. Severe NICM EF  < 20% 3. Normal filling pressures and cardiac output  Review of Systems: [y] = yes, [ ]  = no   General: Weight gain [ ] ; Weight loss [ ] ; Anorexia [ ] ; Fatigue [ ] ; Fever [ ] ; Chills [ ] ; Weakness [ ]   Cardiac: Chest pain/pressure [ ] ; Resting SOB [ ] ; Exertional SOB [ ] ; Orthopnea [ ] ; Pedal Edema [ ] ; Palpitations [ ] ; Syncope [ ] ; Presyncope [ ] ; Paroxysmal nocturnal dyspnea[ ]   Pulmonary: Cough [ ] ; Wheezing[ ] ; Hemoptysis[ ] ; Sputum [ ] ; Snoring [ ]   GI: Vomiting[ ] ; Dysphagia[ ] ; Melena[ ] ; Hematochezia [ ] ; Heartburn[ ] ; Abdominal pain [ ] ; Constipation [ ] ; Diarrhea [ ] ; BRBPR [ ]   GU: Hematuria[ ] ; Dysuria [ ] ; Nocturia[ ]   Vascular: Pain in legs with walking [ ] ; Pain in feet with lying flat [ ] ; Non-healing sores [ ] ; Stroke [ ] ; TIA [ ] ; Slurred speech [ ] ;  Neuro: Headaches[ ] ; Vertigo[ ] ; Seizures[ ] ; Paresthesias[ ] ;Blurred vision [ ] ; Diplopia [ ] ; Vision changes [ ]   Ortho/Skin: Arthritis [ ] ; Joint pain [ Y]; Muscle pain [ ] ; Joint swelling [ ] ; Back Pain [ Y]; Rash [ ]   Psych: Depression[ ] ; Anxiety[ ]   Heme: Bleeding problems [ ] ; Clotting disorders [ ] ; Anemia [ ]   Endocrine: Diabetes [ ] ; Thyroid dysfunction[ ]    Past Medical History:  Diagnosis Date   Arthritis    Chronic back pain    Complication of anesthesia    Patient said that after surgery his throat closed up in 2011   GERD (gastroesophageal reflux disease)    History of DVT of lower extremity    History of kidney stones    Hx of blood clots 2010   Macular atrophy, retinal    bilateral   Torn rotator cuff    right shoulder    Current Outpatient Medications  Medication Sig Dispense Refill   aspirin EC 81 MG tablet Take 81 mg by mouth daily. Swallow whole.     carvedilol (COREG) 3.125 MG tablet Take 1 tablet (3.125 mg total) by mouth 2 (two) times daily with a meal. 60 tablet 2   ciprofloxacin (CIPRO) 500 MG tablet Take 500 mg by mouth 2 (two) times daily.     clotrimazole-betamethasone (LOTRISONE)  cream Apply 1 application topically 2 (two) times daily.     fexofenadine (ALLEGRA) 60 MG tablet Take 60 mg by mouth 2 (two) times daily.     lisinopril (ZESTRIL) 2.5 MG tablet Take 1 tablet (2.5 mg total) by mouth at bedtime. 30 tablet 2   Multiple Vitamin (MULTIVITAMIN) capsule Take 1 capsule by mouth daily.     Omega-3 Fatty Acids (OMEGA 3 500 PO) Take 500 mg by mouth daily.     omeprazole (PRILOSEC) 20 MG capsule Take 20 mg by mouth every morning.      No current facility-administered medications for this encounter.    Allergies  Allergen Reactions   Penicillins Other (See Comments)    REACTION UNSPECIFIED  Has patient had a PCN reaction causing immediate rash, facial/tongue/throat swelling, SOB or lightheadedness with hypotension: unknown childhood allergy Has patient had a PCN reaction causing severe rash involving mucus membranes or skin necrosis: unsure Has patient had a PCN reaction that required hospitalization unsure Has patient had a PCN reaction occurring within the last 10 years: No If all of the above answers are "NO", then may proceed with Cephalosporin use.       Social History   Socioeconomic History   Marital status: Divorced    Spouse name: Not on file   Number of children: 3   Years of education: Not on file   Highest education level: Some college, no degree  Occupational History   Occupation: semi-retired    Comment: produce farmer  Tobacco Use   Smoking status: Never   Smokeless tobacco: Never  Vaping Use   Vaping Use: Never used  Substance and Sexual Activity   Alcohol use: No   Drug use: Never   Sexual activity: Not on file  Other Topics Concern   Not on file  Social History Narrative   Not on file   Social Determinants of Health   Financial Resource Strain: Low Risk    Difficulty of Paying Living Expenses: Not hard at all  Food Insecurity: No Food Insecurity   Worried About Charity fundraiser in the Last Year: Never true   [place] in the Last Year: Never true  Transportation Needs: No Transportation Needs   Lack of Transportation (Medical): No   Lack of Transportation (Non-Medical): No  Physical Activity: Not on file  Stress: Not on file  Social Connections: Not on file  Intimate Partner Violence: Not on file      Family History  Problem  Relation Age of Onset   Liver disease Mother    Hypertension Father     Vitals:   04/22/21 1054  BP: 102/75  Pulse: 87  SpO2: 98%  Weight: 88.5 kg   Wt Readings from Last 3 Encounters:  04/22/21 88.5 kg  04/18/21 89.6 kg  02/19/21 95 kg    PHYSICAL EXAM: General:  Well appearing. No respiratory difficulty. Walked in the clinic.  HEENT: normal Neck: supple. no JVD. Carotids 2+ bilat; no bruits. No lymphadenopathy or thryomegaly appreciated. Cor: PMI nondisplaced. Irregular rate & rhythm. No rubs, gallops or murmurs. Lungs: clear Abdomen: soft, nontender, nondistended. No hepatosplenomegaly. No bruits or masses. Good bowel sounds. Extremities: no cyanosis, clubbing, rash, edema Neuro: alert & oriented x 3, cranial nerves grossly intact. moves all 4 extremities w/o difficulty. Affect pleasant.  ECG:SR 94 bpm  frequet PVCs.    ASSESSMENT & PLAN: HFrEF, NICM -04/12/21 ECHO EF < 20% RV normal -04/16/21 CMRI EF < 22% RV normal, and no LGE to suggest scar.  -04/16/21 Cath with mod CAD. Does not explain reduced EF.. ? PVCs. Check TSH today. HIV NR.  NYHA II GDMT limited by hypotesni.  - Volume status stable. Will give script for 20 mg lasix as needed.  - Continue low dose coreg . No room to titrate.  - On lisinopril 2.5 mg at bedtime but has held a couple doses due to hypotension. Has had SBP in 80s.  MRA- SBP low. Hold off for now.  SGLT2i- hold off for now. Consider next visit.  - Plan to repeat ECHO in 3 month after HF meds optimized.  - Can consider CPX if no improvement in EF/  - Check BMET, TSH today.   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  - Place Zio  to quantify burden.  - May need to add AA if >10%.  - Consider sleep study   3. CAD -LHC 04/2021 with moderate non-obs CAD - On aspirin.  -Discussed statin and however he declined and prefers to continue omega 3. He is concerned about side effects.   4. Blindness   Follow up Dr Haroldine Laws 3-4 weeks.

## 2021-04-22 NOTE — Telephone Encounter (Signed)
Called to confirm Heart & Vascular Transitions of Care appointment at [place] today 1/18. Patient reminded to bring all medications and pill box organizer with them. Confirmed patient has transportation. Gave directions, instructed to use parking garage.   Confirmed appointment prior to ending call.   Pricilla Holm, MSN, RN Heart Failure Nurse Navigator [phone]

## 2021-04-23 NOTE — Addendum Note (Signed)
Encounter addended by: Linda Hedges, RN on: 04/23/2021 4:16 PM  Actions taken: Clinical Note Signed

## 2021-04-23 NOTE — TOC Progression Note (Signed)
Zio patch placed onto patient.  All instructions and information reviewed with patient, they verbalize understanding with no questions. 

## 2021-04-24 DIAGNOSIS — R2689 Other abnormalities of gait and mobility: Secondary | ICD-10-CM | POA: Diagnosis not present

## 2021-04-24 DIAGNOSIS — Z96651 Presence of right artificial knee joint: Secondary | ICD-10-CM | POA: Diagnosis not present

## 2021-04-24 DIAGNOSIS — M25561 Pain in right knee: Secondary | ICD-10-CM | POA: Diagnosis not present

## 2021-04-24 DIAGNOSIS — R531 Weakness: Secondary | ICD-10-CM | POA: Diagnosis not present

## 2021-04-24 DIAGNOSIS — M25661 Stiffness of right knee, not elsewhere classified: Secondary | ICD-10-CM | POA: Diagnosis not present

## 2021-04-27 DIAGNOSIS — I5033 Acute on chronic diastolic (congestive) heart failure: Secondary | ICD-10-CM | POA: Diagnosis not present

## 2021-04-27 DIAGNOSIS — N182 Chronic kidney disease, stage 2 (mild): Secondary | ICD-10-CM | POA: Diagnosis not present

## 2021-04-27 DIAGNOSIS — M1991 Primary osteoarthritis, unspecified site: Secondary | ICD-10-CM | POA: Diagnosis not present

## 2021-04-27 DIAGNOSIS — Z6828 Body mass index (BMI) 28.0-28.9, adult: Secondary | ICD-10-CM | POA: Diagnosis not present

## 2021-04-27 DIAGNOSIS — E663 Overweight: Secondary | ICD-10-CM | POA: Diagnosis not present

## 2021-05-01 DIAGNOSIS — Z96651 Presence of right artificial knee joint: Secondary | ICD-10-CM | POA: Diagnosis not present

## 2021-05-11 ENCOUNTER — Telehealth (HOSPITAL_COMMUNITY): Payer: Self-pay | Admitting: *Deleted

## 2021-05-11 NOTE — Telephone Encounter (Signed)
Pt called c/o dry cough. Pt said Dr.Bensimhon told him to call if he noticed a cough. Pt denies swelling, shortness of breath, and said his weight is stable. Pt said he's had this cough since he was discharged from the hospital but it has worsened the last few days.  No fever or other symptoms at this time.   Routed to Dr.Bensimhon for advice

## 2021-05-14 NOTE — Telephone Encounter (Signed)
Pt has office visit 2/10.

## 2021-05-15 ENCOUNTER — Ambulatory Visit (HOSPITAL_COMMUNITY)
Admission: RE | Admit: 2021-05-15 | Discharge: 2021-05-15 | Disposition: A | Discharge status: Home / Self Care | Payer: No Typology Code available for payment source | Source: Ambulatory Visit | Attending: Internal Medicine | Admitting: Internal Medicine

## 2021-05-15 ENCOUNTER — Other Ambulatory Visit (HOSPITAL_COMMUNITY): Payer: Self-pay

## 2021-05-15 ENCOUNTER — Other Ambulatory Visit: Payer: Self-pay

## 2021-05-15 VITALS — BP 134/84 | HR 89 | Ht 70.0 in | Wt 198.8 lb

## 2021-05-15 DIAGNOSIS — I472 Ventricular tachycardia, unspecified: Secondary | ICD-10-CM | POA: Diagnosis not present

## 2021-05-15 DIAGNOSIS — Z96659 Presence of unspecified artificial knee joint: Secondary | ICD-10-CM | POA: Diagnosis not present

## 2021-05-15 DIAGNOSIS — I2510 Atherosclerotic heart disease of native coronary artery without angina pectoris: Secondary | ICD-10-CM | POA: Diagnosis not present

## 2021-05-15 DIAGNOSIS — Z4789 Encounter for other orthopedic aftercare: Secondary | ICD-10-CM | POA: Diagnosis not present

## 2021-05-15 DIAGNOSIS — Z86718 Personal history of other venous thrombosis and embolism: Secondary | ICD-10-CM | POA: Insufficient documentation

## 2021-05-15 DIAGNOSIS — Z471 Aftercare following joint replacement surgery: Secondary | ICD-10-CM | POA: Diagnosis not present

## 2021-05-15 DIAGNOSIS — R059 Cough, unspecified: Secondary | ICD-10-CM | POA: Insufficient documentation

## 2021-05-15 DIAGNOSIS — I428 Other cardiomyopathies: Secondary | ICD-10-CM | POA: Insufficient documentation

## 2021-05-15 DIAGNOSIS — I471 Supraventricular tachycardia: Secondary | ICD-10-CM | POA: Insufficient documentation

## 2021-05-15 DIAGNOSIS — Z79899 Other long term (current) drug therapy: Secondary | ICD-10-CM | POA: Diagnosis not present

## 2021-05-15 DIAGNOSIS — H547 Unspecified visual loss: Secondary | ICD-10-CM | POA: Insufficient documentation

## 2021-05-15 DIAGNOSIS — I493 Ventricular premature depolarization: Secondary | ICD-10-CM | POA: Insufficient documentation

## 2021-05-15 DIAGNOSIS — I959 Hypotension, unspecified: Secondary | ICD-10-CM | POA: Diagnosis not present

## 2021-05-15 DIAGNOSIS — I5022 Chronic systolic (congestive) heart failure: Secondary | ICD-10-CM | POA: Diagnosis not present

## 2021-05-15 DIAGNOSIS — Z7982 Long term (current) use of aspirin: Secondary | ICD-10-CM | POA: Diagnosis not present

## 2021-05-15 DIAGNOSIS — Z96651 Presence of right artificial knee joint: Secondary | ICD-10-CM | POA: Diagnosis not present

## 2021-05-15 DIAGNOSIS — R008 Other abnormalities of heart beat: Secondary | ICD-10-CM | POA: Insufficient documentation

## 2021-05-15 LAB — BASIC METABOLIC PANEL
Anion gap: 8 (ref 5–15)
BUN: 15 mg/dL (ref 8–23)
CO2: 25 mmol/L (ref 22–32)
Calcium: 9.2 mg/dL (ref 8.9–10.3)
Chloride: 105 mmol/L (ref 98–111)
Creatinine, Ser: 1.23 mg/dL (ref 0.61–1.24)
GFR, Estimated: >60 mL/min (ref >60)
Glucose, Bld: 118 mg/dL — ABNORMAL HIGH (ref 70–99)
Potassium: 4.2 mmol/L (ref 3.5–5.1)
Sodium: 138 mmol/L (ref 135–145)

## 2021-05-15 LAB — BRAIN NATRIURETIC PEPTIDE: B Natriuretic Peptide: 130 pg/mL — ABNORMAL HIGH (ref 0.0–100.0)

## 2021-05-15 MED ORDER — EMPAGLIFLOZIN 10 MG PO TABS: 10.0000 mg | ORAL_TABLET | Freq: Every day | ORAL | 3 refills | Status: DC

## 2021-05-15 MED ORDER — MEXILETINE HCL 200 MG PO CAPS: 200.0000 mg | ORAL_CAPSULE | Freq: Two times a day (BID) | ORAL | 3 refills | Status: DC

## 2021-05-15 MED ORDER — LOSARTAN POTASSIUM 25 MG PO TABS: 12.5000 mg | ORAL_TABLET | Freq: Every day | ORAL | 3 refills | Status: DC

## 2021-05-15 NOTE — Patient Instructions (Addendum)
Medication Changes:  Stop Lisinopril  Start Losartan 12.5 mg (1/2 tab) Daily at Bedtime  Start Jardiance 10 mg Daily  Lab Work:  Labs done today, your results will be available in MyChart, we will contact you for abnormal readings.  Testing/Procedures:  Your physician has requested that you have an echocardiogram. Echocardiography is a painless test that uses sound waves to create images of your heart. It provides your doctor with information about the size and shape of your heart and how well your hearts chambers and valves are working. This procedure takes approximately one hour. There are no restrictions for this procedure. IN 1 MONTH  Referrals:  None  Special Instructions // Education:  Do the following things EVERYDAY: Weigh yourself in the morning before breakfast. Write it down and keep it in a log. Take your medicines as prescribed Eat low salt foods--Limit salt (sodium) to 2000 mg per day.  Stay as active as you can everyday Limit all fluids for the day to less than 2 liters  Follow-Up in: 1 month with an echocardiogram  At the [facility], you and your health needs are our priority. We have a designated team specialized in the treatment of Heart Failure. This Care Team includes your primary Heart Failure Specialized Cardiologist (physician), Advanced Practice Providers (APPs- Physician Assistants and Nurse Practitioners), and Pharmacist who all work together to provide you with the care you need, when you need it.   You may see any of the following providers on your designated Care Team at your next follow up:  Dr Arvilla Meres Dr Carron Curie, NP Robbie Lis, [place] [facility] [place], [place] Karle Plumber, PharmD   Please be sure to bring in all your medications bottles to every appointment.   Need to Contact us:  If you have any questions or concerns before your next appointment please send [place] a message through  [place] or call our office at [phone].    TO LEAVE A MESSAGE FOR THE NURSE SELECT OPTION 2, PLEASE LEAVE A MESSAGE INCLUDING: YOUR NAME DATE OF BIRTH CALL BACK NUMBER REASON FOR CALL**this is important as we prioritize the call backs  YOU WILL RECEIVE A CALL BACK THE SAME DAY AS LONG AS YOU CALL BEFORE 4:00 PM

## 2021-05-15 NOTE — Progress Notes (Signed)
Advanced Heart Failure Clinic Note  Referring Physician: Dr Johnsie Cancel  Primary Care: Dr Gerarda Fraction Primary Cardiologist: Dr Johnsie Cancel  HF MD; Dr Haroldine Laws.   HPI: Gary Ferguson is a 66 year old with history of blindness, DVT with IVC filter, TKR , and recently diagnosed HFrEF  04/2021.    Had knee replacement 02/19/21. Post op course uncomplicated.  Completed PT 2 weeks early. 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    Admitted 04/11/20 with increased dyspnea. ECHO with reduced EF < 20%. Transferred to [facility] for HF management/cath. Diuresed with IV lasix.  Cath with mod non obstructive cad and normal filling pressures/preserved CO. CMRI with EF < 22% RV normal and now LGE to suggest scar. GDMT limited due to soft BP. D/C weight 197 pounds.   Here for f/u. Says he has good days and bad days. Main complaint is coughing spells. Can come at any time. No CP, orthopnea or PND. Working on his farm. Doesn't push himself but says he is feeling much better.  Thinks cough may be lisinopril. SBP 90-110 at home   [facility] 1/23 1. Sinus rhythm - avg HR of 90 bpm. 2. Five runs of nonsustained Ventricular Tachycardia roccurred, the run with the fastest interval lasting 4 beats with a max rate of 207 bpm, the longest lasting 5 beats with an avg rate of 169 bpm. 3. Ten runs of Supraventricular Tachycardia  occurred, the run with the fastest interval lasting 4 beats with a max rate of 182 bpm, the longest lasting 15 beats with an avg rate of 121 bpm. 4 Frequent PVCs (9.0%, 78220) - primarily one morphology 5. Ventricular Bigeminy and Trigeminy were present.   Cardiac Testing  Echo 04/12/21 EF < 20%  CMRI < 22%. Normal RV. No LGE  to suggest scar.    Cath 04/16/2021   1st [name] lesion is 30% stenosed.   1st Diag lesion is 70% stenosed.   Prox LAD to Mid LAD lesion is 40% stenosed.   Prox RCA to Mid RCA  lesion is 30% stenosed.   The left ventricular ejection fraction is less than 25% by visual estimate.  Findings:  Ao = 85/60 (70) LV = 88/19 RA = 3 RV = 26/4 PA = 29/11 (20) PCW = 16 Fick cardiac output/index = 6.6/3.2 PVR = 0.6 WU SVR = 810 FA sat = 97% PA sat = 72%, 74% SVC sat = 73%   Assessment:  1. Moderate non-obstructive CAD 2. Severe NICM EF < 20% 3. Normal filling pressures and cardiac output    Past Medical History:  Diagnosis Date   Arthritis    Chronic back pain    Complication of anesthesia    Patient said that after surgery his throat closed up in 2011   GERD (gastroesophageal reflux disease)    History of DVT of lower extremity    History of kidney stones    Hx of blood clots 2010   Macular atrophy, retinal    bilateral   Torn rotator cuff    right shoulder    Current Outpatient Medications  Medication Sig Dispense Refill   ACETAMINOPHEN PO Take 2 tablets by mouth as needed.     aspirin EC 81 MG tablet Take 81 mg by mouth daily. Swallow whole.     carvedilol (COREG) 3.125 MG tablet Take 1 tablet (3.125 mg total) by mouth 2 (  two) times daily with a meal. 60 tablet 2   ciprofloxacin (CIPRO) 500 MG tablet Take 500 mg by mouth 2 (two) times daily.     clotrimazole-betamethasone (LOTRISONE) cream Apply 1 application topically 2 (two) times daily.     fexofenadine (ALLEGRA) 60 MG tablet Take 60 mg by mouth as needed.     furosemide (LASIX) 20 MG tablet Take 1 tablet (20 mg total) by mouth as needed. 8 tablet 0   lisinopril (ZESTRIL) 2.5 MG tablet Take 1 tablet (2.5 mg total) by mouth at bedtime. 30 tablet 2   Multiple Vitamin (MULTIVITAMIN) capsule Take 1 capsule by mouth daily.     Omega-3 Fatty Acids (OMEGA 3 500 PO) Take 500 mg by mouth 2 (two) times daily.     omeprazole (PRILOSEC) 20 MG capsule Take 20 mg by mouth every morning.      No current facility-administered medications for this encounter.    Allergies  Allergen Reactions   Penicillins  Other (See Comments)    REACTION UNSPECIFIED  Has patient had a PCN reaction causing immediate rash, facial/tongue/throat swelling, SOB or lightheadedness with hypotension: unknown childhood allergy Has patient had a PCN reaction causing severe rash involving mucus membranes or skin necrosis: unsure Has patient had a PCN reaction that required hospitalization unsure Has patient had a PCN reaction occurring within the last 10 years: No If all of the above answers are "NO", then may proceed with Cephalosporin use.       Social History   Socioeconomic History   Marital status: Divorced    Spouse name: Not on file   Number of children: 3   Years of education: Not on file   Highest education level: Some college, no degree  Occupational History   Occupation: semi-retired    Comment: produce farmer  Tobacco Use   Smoking status: Never   Smokeless tobacco: Never  Vaping Use   Vaping Use: Never used  Substance and Sexual Activity   Alcohol use: No   Drug use: Never   Sexual activity: Not on file  Other Topics Concern   Not on file  Social History Narrative   Not on file   Social Determinants of Health   Financial Resource Strain: Low Risk    Difficulty of Paying Living Expenses: Not hard at all  Food Insecurity: No Food Insecurity   Worried About Charity fundraiser in the Last Year: Never true   [place] in the Last Year: Never true  Transportation Needs: No Transportation Needs   Lack of Transportation (Medical): No   Lack of Transportation (Non-Medical): No  Physical Activity: Not on file  Stress: Not on file  Social Connections: Not on file  Intimate Partner Violence: Not on file      Family History  Problem Relation Age of Onset   Liver disease Mother    Hypertension Father     Vitals:   05/15/21 1034  BP: 134/84  Pulse: 89  SpO2: 97%  Weight: 90.2 kg (198 lb 12.8 oz)  Height: 5\' 10"  (1.778 m)   Wt Readings from Last 3 Encounters:  05/15/21 90.2  kg (198 lb 12.8 oz)  04/22/21 88.5 kg (195 lb 3.2 oz)  04/18/21 89.6 kg (197 lb 8 oz)   Vitals:   05/15/21 1034  BP: 134/84  Pulse: 89  SpO2: 97%     PHYSICAL EXAM: General:  Well appearing. No resp difficulty HEENT: normal Neck: supple. no JVD. Carotids 2+ bilat;  no bruits. No lymphadenopathy or thryomegaly appreciated. Cor: PMI nondisplaced. Regular rate & rhythm. No rubs, gallops or murmurs. Lungs: clear Abdomen: soft, nontender, nondistended. No hepatosplenomegaly. No bruits or masses. Good bowel sounds. Extremities: no cyanosis, clubbing, rash, RL 2+ chronic  edema LLE no ede,a Neuro: alert & orientedx3, cranial nerves grossly intact. moves all 4 extremities w/o difficulty. Affect pleasant  Reds 37%   ASSESSMENT & PLAN: HFrEF, NICM -04/12/21 ECHO EF < 20% RV normal -04/16/21 CMRI EF < 22% RV normal, and no LGE to suggest scar.  -04/16/21 Cath with mod CAD. Does not explain reduced EF.. ? PVCs.  - NYHA II GDMT limited by hypotension - Volume status mildly elevated at 37% - Continue low dose coreg . No room to titrate.  - Having cough with lisinopril 2.5. Switch to losartan 12.5 qhs - Add Jardiance 10  - See back 1 month NP/PA - Echo 2-3 months - labs today  2.PVC - Zio 1/23 9% PVCs - start mexilitene 200 bid - Consider sleep study   3. CAD -LHC 04/2021 with moderate non-obs CAD - On aspirin.  - No s/s angina - Discussed statin and however he declined and prefers to continue omega 3. He is concerned about side effects.   4. Blindness   Glori Bickers, MD  9:36 PM

## 2021-05-15 NOTE — Progress Notes (Signed)
ReDS Vest / Clip - 05/15/21 1100       ReDS Vest / Clip   Station Marker C    Ruler Value 26    ReDS Value Range Moderate volume overload    ReDS Actual Value 37

## 2021-05-18 ENCOUNTER — Telehealth (HOSPITAL_COMMUNITY): Payer: Self-pay | Admitting: Pharmacist

## 2021-05-18 NOTE — Telephone Encounter (Signed)
Patient Advocate Encounter   Received notification from [employer] that prior authorization for Mexiletine is required.   PA submitted on CoverMyMeds Key [id number] Status is pending   Will continue to follow.   Karle Plumber, PharmD, BCPS, BCCP, CPP Heart Failure Clinic Pharmacist [phone]

## 2021-05-18 NOTE — Telephone Encounter (Signed)
Advanced Heart Failure Patient Advocate Encounter  Prior Authorization for mexiletine has been approved.    Effective dates: 05/18/21 through 05/18/22  Audry Riles, PharmD, BCPS, BCCP, CPP Heart Failure Clinic Pharmacist [phone]

## 2021-06-01 ENCOUNTER — Encounter (HOSPITAL_COMMUNITY): Payer: No Typology Code available for payment source | Admitting: Cardiology

## 2021-06-09 DIAGNOSIS — M25661 Stiffness of right knee, not elsewhere classified: Secondary | ICD-10-CM | POA: Diagnosis not present

## 2021-06-09 DIAGNOSIS — M25561 Pain in right knee: Secondary | ICD-10-CM | POA: Diagnosis not present

## 2021-06-09 DIAGNOSIS — R2689 Other abnormalities of gait and mobility: Secondary | ICD-10-CM | POA: Diagnosis not present

## 2021-06-09 DIAGNOSIS — Z96651 Presence of right artificial knee joint: Secondary | ICD-10-CM | POA: Diagnosis not present

## 2021-06-09 DIAGNOSIS — R531 Weakness: Secondary | ICD-10-CM | POA: Diagnosis not present

## 2021-06-10 NOTE — Progress Notes (Signed)
Advanced Heart Failure Clinic Note  Referring Physician: Dr Eden Emms  Primary Care: Dr Sherwood Gambler Primary Cardiologist: Dr Eden Emms  HF MD; Dr Gala Romney.   HPI: Gary Ferguson is a 66 year old with history of blindness, DVT with IVC filter, TKR , and recently diagnosed HFrEF 04/2021.    Had knee replacement 02/19/21. Post op course uncomplicated.  Completed PT 2 weeks early. 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    Admitted 04/11/20 with increased dyspnea. ECHO with reduced EF < 20%. Transferred to [facility] for HF management/cath. Diuresed with IV lasix.  Cath with mod non obstructive cad and normal filling pressures/preserved CO. CMRI with EF < 22% RV normal and now LGE to suggest scar. GDMT limited due to soft BP. D/C weight 197 pounds.   Saw Dr Gala Romney on 05/15/21. Had high PVC burden so mexiletine was started. Had cough so lisinopril was switched to losartan.   Overall feeling fine. Last visit he was started on Mexiletine due to PVC burden. Feels ok. Says he gets a little tired he just slows down. 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 Denies SOB/PND/Orthopnea. Appetite ok. No fever or chills. Weight at home has been stable. Taking all medications.   Zio 1/23 1. Sinus rhythm - avg HR of 90 bpm. 2. Five runs of nonsustained Ventricular Tachycardia roccurred, the run with the fastest interval lasting 4 beats with a max rate of 207 bpm, the longest lasting 5 beats with an avg rate of 169 bpm. 3. Ten runs of Supraventricular Tachycardia  occurred, the run with the fastest interval lasting 4 beats with a max rate of 182 bpm, the longest lasting 15 beats with an avg rate of 121 bpm. 4 Frequent PVCs (9.0%, 78220) - primarily one morphology 5. Ventricular Bigeminy and Trigeminy were present.   Cardiac Testing  Echo 04/12/21 EF < 20%  CMRI < 22%. Normal  RV. No LGE  to suggest scar.    Cath 04/16/2021   1st [name] lesion is 30% stenosed.   1st Diag lesion is 70% stenosed.   Prox LAD to Mid LAD lesion is 40% stenosed.   Prox RCA to Mid RCA lesion is 30% stenosed.   The left ventricular ejection fraction is less than 25% by visual estimate.  Findings:  Ao = 85/60 (70) LV = 88/19 RA = 3 RV = 26/4 PA = 29/11 (20) PCW = 16 Fick cardiac output/index = 6.6/3.2 PVR = 0.6 WU SVR = 810 FA sat = 97% PA sat = 72%, 74% SVC sat = 73%   Assessment:  1. Moderate non-obstructive CAD 2. Severe NICM EF < 20% 3. Normal filling pressures and cardiac output    Past Medical History:  Diagnosis Date   Arthritis    Chronic back pain    Complication of anesthesia    Patient said that after surgery his throat closed up in 2011   GERD (gastroesophageal reflux disease)    History of DVT of lower extremity    History of kidney stones    Hx of blood clots 2010   Macular atrophy, retinal    bilateral   Torn rotator cuff    right shoulder    Current Outpatient Medications  Medication Sig Dispense Refill   ACETAMINOPHEN PO Take 2 tablets by mouth as needed.     aspirin EC 81 MG tablet  Take 81 mg by mouth daily. Swallow whole.     carvedilol (COREG) 3.125 MG tablet Take 1 tablet (3.125 mg total) by mouth 2 (two) times daily with a meal. 60 tablet 2   ciprofloxacin (CIPRO) 500 MG tablet Take 500 mg by mouth 2 (two) times daily.     clotrimazole-betamethasone (LOTRISONE) cream Apply 1 application topically 2 (two) times daily.     cyclobenzaprine (FLEXERIL) 10 MG tablet Take 10 mg by mouth 3 (three) times daily as needed.     empagliflozin (JARDIANCE) 10 MG TABS tablet Take 1 tablet (10 mg total) by mouth daily before breakfast. 90 tablet 3   fexofenadine (ALLEGRA) 60 MG tablet Take 60 mg by mouth daily as needed.     losartan (COZAAR) 25 MG tablet Take 0.5 tablets (12.5 mg total) by mouth at bedtime. 15 tablet 3   mexiletine (MEXITIL) 200 MG capsule  Take 1 capsule (200 mg total) by mouth 2 (two) times daily. 60 capsule 3   Multiple Vitamin (MULTIVITAMIN) capsule Take 1 capsule by mouth daily.     Omega-3 Fatty Acids (OMEGA 3 500 PO) Take 500 mg by mouth 2 (two) times daily.     omeprazole (PRILOSEC) 20 MG capsule Take 20 mg by mouth every morning.      traMADol (ULTRAM) 50 MG tablet Take 50 mg by mouth every 6 (six) hours as needed.     No current facility-administered medications for this encounter.   Facility-Administered Medications Ordered in Other Encounters  Medication Dose Route Frequency Provider Last Rate Last Admin   perflutren lipid microspheres (DEFINITY) IV suspension  1-10 mL Intravenous PRN Ramya Vanbergen D, NP   4 mL at 06/11/21 1157    Allergies  Allergen Reactions   Penicillins Other (See Comments)    REACTION UNSPECIFIED  Has patient had a PCN reaction causing immediate rash, facial/tongue/throat swelling, SOB or lightheadedness with hypotension: unknown childhood allergy Has patient had a PCN reaction causing severe rash involving mucus membranes or skin necrosis: unsure Has patient had a PCN reaction that required hospitalization unsure Has patient had a PCN reaction occurring within the last 10 years: No If all of the above answers are "NO", then may proceed with Cephalosporin use.       Social History   Socioeconomic History   Marital status: Divorced    Spouse name: Not on file   Number of children: 3   Years of education: Not on file   Highest education level: Some college, no degree  Occupational History   Occupation: semi-retired    Comment: produce farmer  Tobacco Use   Smoking status: Never   Smokeless tobacco: Never  Vaping Use   Vaping Use: Never used  Substance and Sexual Activity   Alcohol use: No   Drug use: Never   Sexual activity: Not on file  Other Topics Concern   Not on file  Social History Narrative   Not on file   Social Determinants of Health   Financial Resource Strain:  Low Risk    Difficulty of Paying Living Expenses: Not hard at all  Food Insecurity: No Food Insecurity   Worried About Programme researcher, broadcasting/film/video in the Last Year: Never true   Ran Out of Food in the Last Year: Never true  Transportation Needs: No Transportation Needs   Lack of Transportation (Medical): No   Lack of Transportation (Non-Medical): No  Physical Activity: Not on file  Stress: Not on file  Social Connections: Not on  file  Intimate Partner Violence: Not on file      Family History  Problem Relation Age of Onset   Liver disease Mother    Hypertension Father     Vitals:   06/11/21 1205  BP: 112/72  Pulse: 77  SpO2: 100%  Weight: 89 kg (196 lb 3.2 oz)    Wt Readings from Last 3 Encounters:  06/11/21 89 kg (196 lb 3.2 oz)  05/15/21 90.2 kg (198 lb 12.8 oz)  04/22/21 88.5 kg (195 lb 3.2 oz)   Vitals:   06/11/21 1205  BP: 112/72  Pulse: 77  SpO2: 100%      ReDS Vest / Clip - 06/11/21 1200       ReDS Vest / Clip   Station Marker C    Ruler Value 28    ReDS Value Range Low volume    ReDS Actual Value 28             PHYSICAL EXAM: General:  Well appearing. No resp difficulty HEENT: normal Neck: supple. no JVD. Carotids 2+ bilat; no bruits. No lymphadenopathy or thryomegaly appreciated. Cor: PMI nondisplaced. Regular rate & rhythm. No rubs, gallops or murmurs. Lungs: clear Abdomen: soft, nontender, nondistended. No hepatosplenomegaly. No bruits or masses. Good bowel sounds. Extremities: no cyanosis, clubbing, rash, edema Neuro: alert & orientedx3, cranial nerves grossly intact. moves all 4 extremities w/o difficulty. Affect pleasant  EKG: SR 79 bpm personally checked.   ASSESSMENT & PLAN: HFrEF, NICM -04/12/21 ECHO EF < 20% RV normal -04/16/21 CMRI EF < 22% RV normal, and no LGE to suggest scar.  -04/16/21 Cath with mod CAD. Does not explain reduced EF. ? PVCs.  - NYHA II GDMT limited by hypotension - Echo briefly reviewed by Dr Shirlee Latch. EF ~30%. Formal  read pending. Needs additional time and will repeat ECHO in a few months. Discussed if EF remains < 30% will been to consider referral to EP for ICD.  -Volume status stable. Reds Clip is not high. He does not need loop diuretics.  - Continue low dose coreg . No room to titrate.  - Continue losartan 12.5 qhs - Continue Jardiance 10 mg daily  - Add 12.5 mg spironolactone daily.  - Check BMET in 7 days. He will check at [facility].   2.PVC - Zio 1/23 9% PVCs  - EKG today. No PVCs.  - Continue mexilitene 200 bid - Consider sleep study   3. CAD -LHC 04/2021 with moderate non-obs CAD - On aspirin.  - No chest pain.  - Discussed statin and however he declined and prefers to continue omega 3. He is concerned about side effects.   4. Blindness  Follow up with Dr Gala Romney in 6 weeks. Repeat ECHO in a few months.    Gary Becket, NP  12:11 PM

## 2021-06-11 ENCOUNTER — Encounter (HOSPITAL_COMMUNITY): Payer: Self-pay

## 2021-06-11 ENCOUNTER — Ambulatory Visit (HOSPITAL_COMMUNITY)
Admission: RE | Admit: 2021-06-11 | Discharge: 2021-06-11 | Disposition: A | Discharge status: Home / Self Care | Payer: No Typology Code available for payment source | Source: Ambulatory Visit | Attending: Adult Health | Admitting: Adult Health

## 2021-06-11 ENCOUNTER — Other Ambulatory Visit: Payer: Self-pay

## 2021-06-11 ENCOUNTER — Ambulatory Visit (HOSPITAL_BASED_OUTPATIENT_CLINIC_OR_DEPARTMENT_OTHER)
Admission: RE | Admit: 2021-06-11 | Discharge: 2021-06-11 | Disposition: A | Discharge status: Home / Self Care | Payer: No Typology Code available for payment source | Source: Ambulatory Visit | Attending: Adult Health | Admitting: Adult Health

## 2021-06-11 VITALS — BP 112/72 | HR 77 | Wt 196.2 lb

## 2021-06-11 DIAGNOSIS — I959 Hypotension, unspecified: Secondary | ICD-10-CM | POA: Diagnosis not present

## 2021-06-11 DIAGNOSIS — Z7984 Long term (current) use of oral hypoglycemic drugs: Secondary | ICD-10-CM | POA: Diagnosis not present

## 2021-06-11 DIAGNOSIS — I5022 Chronic systolic (congestive) heart failure: Secondary | ICD-10-CM | POA: Insufficient documentation

## 2021-06-11 DIAGNOSIS — I493 Ventricular premature depolarization: Secondary | ICD-10-CM | POA: Diagnosis not present

## 2021-06-11 DIAGNOSIS — I358 Other nonrheumatic aortic valve disorders: Secondary | ICD-10-CM | POA: Diagnosis not present

## 2021-06-11 DIAGNOSIS — I472 Ventricular tachycardia, unspecified: Secondary | ICD-10-CM | POA: Diagnosis not present

## 2021-06-11 DIAGNOSIS — I700 Atherosclerosis of aorta: Secondary | ICD-10-CM | POA: Diagnosis not present

## 2021-06-11 DIAGNOSIS — Z7982 Long term (current) use of aspirin: Secondary | ICD-10-CM | POA: Diagnosis not present

## 2021-06-11 DIAGNOSIS — R008 Other abnormalities of heart beat: Secondary | ICD-10-CM | POA: Insufficient documentation

## 2021-06-11 DIAGNOSIS — Z86718 Personal history of other venous thrombosis and embolism: Secondary | ICD-10-CM | POA: Insufficient documentation

## 2021-06-11 DIAGNOSIS — H547 Unspecified visual loss: Secondary | ICD-10-CM | POA: Diagnosis not present

## 2021-06-11 DIAGNOSIS — I471 Supraventricular tachycardia: Secondary | ICD-10-CM | POA: Insufficient documentation

## 2021-06-11 DIAGNOSIS — I517 Cardiomegaly: Secondary | ICD-10-CM | POA: Diagnosis not present

## 2021-06-11 DIAGNOSIS — R9431 Abnormal electrocardiogram [ECG] [EKG]: Secondary | ICD-10-CM | POA: Insufficient documentation

## 2021-06-11 DIAGNOSIS — Z96659 Presence of unspecified artificial knee joint: Secondary | ICD-10-CM | POA: Insufficient documentation

## 2021-06-11 DIAGNOSIS — I428 Other cardiomyopathies: Secondary | ICD-10-CM | POA: Insufficient documentation

## 2021-06-11 DIAGNOSIS — Z79899 Other long term (current) drug therapy: Secondary | ICD-10-CM | POA: Insufficient documentation

## 2021-06-11 DIAGNOSIS — I340 Nonrheumatic mitral (valve) insufficiency: Secondary | ICD-10-CM | POA: Diagnosis not present

## 2021-06-11 DIAGNOSIS — I2510 Atherosclerotic heart disease of native coronary artery without angina pectoris: Secondary | ICD-10-CM | POA: Diagnosis not present

## 2021-06-11 LAB — ECHOCARDIOGRAM COMPLETE
AR max vel: 3.35 cm2
AV Peak grad: 5.2 mmHg
Ao pk vel: 1.14 m/s
Area-P 1/2: 5.5 cm2
S' Lateral: 5.4 cm

## 2021-06-11 MED ORDER — SPIRONOLACTONE 25 MG PO TABS: 12.5000 mg | ORAL_TABLET | Freq: Every day | ORAL | 3 refills | Status: DC

## 2021-06-11 MED ORDER — PERFLUTREN LIPID MICROSPHERE
1.0000 mL | INTRAVENOUS | Status: DC | PRN
  Administered 2021-06-11: 12:00:00 4 mL via INTRAVENOUS
  Filled 2021-06-11: qty 10

## 2021-06-11 NOTE — Progress Notes (Signed)
ReDS Vest / Clip - 06/11/21 1200   ? ?  ? ReDS Vest / Clip  ? Station Marker C   ? Ruler Value 28   ? ReDS Value Range Low volume   ? ReDS Actual Value 28   ? ?  ?  ? ?  ? ? ?

## 2021-06-11 NOTE — Progress Notes (Signed)
Echocardiogram ?2D Echocardiogram has been performed. ? ?Eduard Roux ?06/11/2021, 11:57 AM ?

## 2021-06-11 NOTE — Patient Instructions (Signed)
Start Spironolactone 12.5 mg (1/2 tab) Daily AT BEDTIME ? ?Your physician recommends that you return for lab work in: 1 week, this can be done at [facility] ? ?Your physician recommends that you schedule a follow-up appointment in: 6-8 weeks with Dr Gala Romney ? ?If you have any questions or concerns before your next appointment please send [place] a message through Olga or call our office at [phone].   ? ?TO LEAVE A MESSAGE FOR THE NURSE SELECT OPTION 2, PLEASE LEAVE A MESSAGE INCLUDING: ?YOUR NAME ?DATE OF BIRTH ?CALL BACK NUMBER ?REASON FOR CALL**this is important as we prioritize the call backs ? ?YOU WILL RECEIVE A CALL BACK THE SAME DAY AS LONG AS YOU CALL BEFORE 4:00 PM ? ?At the [facility], you and your health needs are our priority. As part of our continuing mission to provide you with exceptional heart care, we have created designated Provider Care Teams. These Care Teams include your primary Cardiologist (physician) and Advanced Practice Providers (APPs- Physician Assistants and Nurse Practitioners) who all work together to provide you with the care you need, when you need it.  ? ?You may see any of the following providers on your designated Care Team at your next follow up: ?Dr Arvilla Meres ?Dr Marca Ancona ?Tonye Becket, NP ?Robbie Lis, PA ?Jessica Milford,NP ?Anna Genre, PA ?Karle Plumber, PharmD ? ? ?Please be sure to bring in all your medications bottles to every appointment.  ? ? ?

## 2021-06-30 ENCOUNTER — Encounter: Payer: Self-pay | Admitting: *Deleted

## 2021-07-03 DIAGNOSIS — E782 Mixed hyperlipidemia: Secondary | ICD-10-CM | POA: Diagnosis not present

## 2021-07-03 DIAGNOSIS — K219 Gastro-esophageal reflux disease without esophagitis: Secondary | ICD-10-CM | POA: Diagnosis not present

## 2021-07-09 DIAGNOSIS — Z79899 Other long term (current) drug therapy: Secondary | ICD-10-CM | POA: Diagnosis not present

## 2021-07-09 DIAGNOSIS — Z6828 Body mass index (BMI) 28.0-28.9, adult: Secondary | ICD-10-CM | POA: Diagnosis not present

## 2021-07-09 DIAGNOSIS — E6609 Other obesity due to excess calories: Secondary | ICD-10-CM | POA: Diagnosis not present

## 2021-07-09 DIAGNOSIS — Z1331 Encounter for screening for depression: Secondary | ICD-10-CM | POA: Diagnosis not present

## 2021-07-09 DIAGNOSIS — Z125 Encounter for screening for malignant neoplasm of prostate: Secondary | ICD-10-CM | POA: Diagnosis not present

## 2021-07-09 DIAGNOSIS — Z0001 Encounter for general adult medical examination with abnormal findings: Secondary | ICD-10-CM | POA: Diagnosis not present

## 2021-07-09 DIAGNOSIS — I5033 Acute on chronic diastolic (congestive) heart failure: Secondary | ICD-10-CM | POA: Diagnosis not present

## 2021-07-09 DIAGNOSIS — N1831 Chronic kidney disease, stage 3a: Secondary | ICD-10-CM | POA: Diagnosis not present

## 2021-07-23 ENCOUNTER — Encounter (HOSPITAL_COMMUNITY): Payer: Self-pay | Admitting: Internal Medicine

## 2021-07-23 ENCOUNTER — Ambulatory Visit (HOSPITAL_COMMUNITY)
Admission: RE | Admit: 2021-07-23 | Discharge: 2021-07-23 | Disposition: A | Discharge status: Home / Self Care | Payer: No Typology Code available for payment source | Source: Ambulatory Visit | Attending: Internal Medicine | Admitting: Internal Medicine

## 2021-07-23 VITALS — BP 110/78 | HR 90 | Wt 200.6 lb

## 2021-07-23 DIAGNOSIS — Z8249 Family history of ischemic heart disease and other diseases of the circulatory system: Secondary | ICD-10-CM | POA: Diagnosis not present

## 2021-07-23 DIAGNOSIS — I5022 Chronic systolic (congestive) heart failure: Secondary | ICD-10-CM | POA: Diagnosis not present

## 2021-07-23 DIAGNOSIS — H547 Unspecified visual loss: Secondary | ICD-10-CM | POA: Insufficient documentation

## 2021-07-23 DIAGNOSIS — I428 Other cardiomyopathies: Secondary | ICD-10-CM | POA: Diagnosis not present

## 2021-07-23 DIAGNOSIS — Z79899 Other long term (current) drug therapy: Secondary | ICD-10-CM | POA: Diagnosis not present

## 2021-07-23 DIAGNOSIS — Z86718 Personal history of other venous thrombosis and embolism: Secondary | ICD-10-CM | POA: Insufficient documentation

## 2021-07-23 DIAGNOSIS — I2510 Atherosclerotic heart disease of native coronary artery without angina pectoris: Secondary | ICD-10-CM | POA: Diagnosis not present

## 2021-07-23 DIAGNOSIS — Z7982 Long term (current) use of aspirin: Secondary | ICD-10-CM | POA: Diagnosis not present

## 2021-07-23 DIAGNOSIS — I493 Ventricular premature depolarization: Secondary | ICD-10-CM | POA: Insufficient documentation

## 2021-07-23 NOTE — Progress Notes (Signed)
? ?Advanced [facility] Failure Clinic Note ? ?Referring Physician: Dr Johnsie Cancel  ?Primary Care: Dr Gerarda Fraction ?Primary Cardiologist: Dr Johnsie Cancel  ?HF MD; Dr Haroldine Laws.  ? ?HPI: ?[name] is a 66 year old with history of blindness, DVT with IVC filter, TKR , and recently diagnosed HFrEF 04/2021.   ? ?Had knee replacement 02/19/21. Post op course uncomplicated.  Completed PT 2 weeks early. 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   ? ?Admitted 04/11/20 with increased dyspnea. ECHO with reduced EF < 20%. Transferred to [facility] for HF management/cath. Diuresed with IV lasix.  Cath with mod non obstructive cad and normal filling pressures/preserved CO. CMRI with EF < 22% RV normal and now LGE to suggest scar. GDMT limited due to soft BP. D/C weight 197 pounds.  ? ?Saw Dr Haroldine Laws on 05/15/21. Had high PVC burden so mexiletine was started. Had cough so lisinopril was switched to losartan.  ? ?Today he returns for HF follow up.Overall feeling fine. Denies SOB/PND/Orthopnea. Appetite ok. No fever or chills. Weight at home 200-->198 pounds. .  Continues work in his garden. Very active at home. Taking all medications . ? ? ?Zio 1/23 ?1. Sinus rhythm - avg HR of 90 bpm. ?2. Five runs of nonsustained Ventricular Tachycardia roccurred, the run with the fastest interval lasting 4 beats with a max rate of 207 bpm, the longest lasting 5 beats with an avg rate of 169 bpm. ?3. Ten runs of Supraventricular Tachycardia  occurred, the run with the fastest interval lasting 4 beats with a max rate of 182 bpm, the longest lasting 15 beats with an avg rate of 121 bpm. ?4 Frequent PVCs (9.0%, [id number]) - primarily one morphology ?5. Ventricular Bigeminy and Trigeminy were present.  ? ?Cardiac Testing  ?Echo 04/12/21 EF < 20% ?Echo 06/2021 EF 20-25% RV normal  ? ?CMRI < 22%. Normal RV. No LGE  to suggest scar.   ? ?Cath 04/16/2021  ? 1st [name]  lesion is 30% stenosed. ?  1st Diag lesion is 70% stenosed. ?  Prox LAD to Mid LAD lesion is 40% stenosed. ?  Prox RCA to Mid RCA lesion is 30% stenosed. ?  The left ventricular ejection fraction is less than 25% by visual estimate.  ?Findings:  ?Ao = 85/60 (70) ?LV = 88/19 ?RA = 3 ?RV = 26/4 ?PA = 29/11 (20) ?PCW = 16 ?Fick cardiac output/index = 6.6/3.2 ?PVR = 0.6 WU ?SVR = 810 ?FA sat = 97% ?PA sat = 72%, 74% ?SVC sat = 73%   ?Assessment:  ?1. Moderate non-obstructive CAD ?2. Severe NICM EF < 20% ?3. Normal filling pressures and cardiac output ? ? ? ?Past Medical History:  ?Diagnosis Date  ? Arthritis   ? Chronic back pain   ? Complication of anesthesia   ? Patient said that after surgery his throat closed up in 2011  ? GERD (gastroesophageal reflux disease)   ? History of DVT of lower extremity   ? History of kidney stones   ? Hx of blood clots 2010  ? 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 Torn rotator cuff   ? right shoulder  ? ? ?Current Outpatient Medications  ?Medication Sig Dispense Refill  ? ACETAMINOPHEN PO Take 2 tablets by mouth as needed.    ? aspirin EC 81 MG tablet Take 81 mg by mouth daily. Swallow whole.    ?  carvedilol (COREG) 3.125 MG tablet Take 1 tablet (3.125 mg total) by mouth 2 (two) times daily with a meal. 60 tablet 2  ? ciprofloxacin (CIPRO) 500 MG tablet Take 500 mg by mouth 2 (two) times daily.    ? clotrimazole-betamethasone (LOTRISONE) cream Apply 1 application topically 2 (two) times daily.    ? empagliflozin (JARDIANCE) 10 MG TABS tablet Take 1 tablet (10 mg total) by mouth daily before breakfast. 90 tablet 3  ? fexofenadine (ALLEGRA) 60 MG tablet Take 60 mg by mouth daily as needed.    ? losartan (COZAAR) 25 MG tablet Take 0.5 tablets (12.5 mg total) by mouth at bedtime. 15 tablet 3  ? mexiletine (MEXITIL) 200 MG capsule Take 1 capsule (200 mg total) by mouth 2 (two) times daily. 60 capsule 3  ? Multiple Vitamin (MULTIVITAMIN) capsule Take 1 capsule by mouth daily.    ? Omega-3  Fatty Acids (OMEGA 3 500 PO) Take 500 mg by mouth 2 (two) times daily.    ? omeprazole (PRILOSEC) 20 MG capsule Take 20 mg by mouth every morning.     ? spironolactone (ALDACTONE) 25 MG tablet Take 0.5 tablets (12.5 mg total) by mouth at bedtime. 15 tablet 3  ? ?No current facility-administered medications for this encounter.  ? ? ?Allergies  ?Allergen Reactions  ? Penicillins Other (See Comments)  ?  REACTION UNSPECIFIED  ?Has patient had a PCN reaction causing immediate rash, facial/tongue/throat swelling, SOB or lightheadedness with hypotension: unknown childhood allergy ?Has patient had a PCN reaction causing severe rash involving mucus membranes or skin necrosis: unsure ?Has patient had a PCN reaction that required hospitalization unsure ?Has patient had a PCN reaction occurring within the last 10 years: No ?If all of the above answers are "NO", then may proceed with Cephalosporin use. ?  ? ? ?  ?Social History  ? ?Socioeconomic History  ? Marital status: Divorced  ?  Spouse name: Not on file  ? Number of children: 3  ? Years of education: Not on file  ? Highest education level: Some college, no degree  ?Occupational History  ? 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 Smoking status: Never  ? Smokeless tobacco: Never  ?Vaping Use  ? Vaping Use: Never used  ?Substance and Sexual Activity  ? Alcohol use: No  ? Drug use: Never  ? Sexual activity: Not on file  ?Other Topics Concern  ? Not on file  ?Social History Narrative  ? Not on file  ? ?Social Determinants of Health  ? ?Financial Resource Strain: Low Risk   ? Difficulty of Paying Living Expenses: Not hard at all  ?Food Insecurity: No Food Insecurity  ? Worried About Charity fundraiser in the Last Year: Never true  ? Ran Out of Food in the Last Year: Never true  ?Transportation Needs: No Transportation Needs  ? Lack of Transportation (Medical): No  ? Lack of Transportation (Non-Medical): No  ?Physical Activity: Not on file   ?Stress: Not on file  ?Social Connections: Not on file  ?Intimate Partner Violence: Not on file  ? ? ?  ?Family History  ?Problem Relation Age of Onset  ? Liver disease Mother   ? Hypertension Father   ? ? ?Vitals:  ? 07/23/21 1519  ?BP: 110/78  ?Pulse: 90  ?SpO2: 93%  ?Weight: 91 kg (200 lb 9.6 oz)  ? ? ?Wt Readings from Last 3 Encounters:  ?07/23/21 91 kg (200 lb 9.6 oz)  ?  06/11/21 89 kg (196 lb 3.2 oz)  ?05/15/21 90.2 kg (198 lb 12.8 oz)  ? ?Vitals:  ? 07/23/21 1519  ?BP: 110/78  ?Pulse: 90  ?SpO2: 93%  ? ? ? ? ReDS Vest / Clip - 07/23/21 1500   ? ?  ? ReDS Vest / Clip  ? Station Marker D   ? Ruler Value 32   ? ReDS Value Range Low volume   ? ReDS Actual Value 26   ? ?  ?  ? ?  ? ? ?PHYSICAL EXAM: ?General:  Well appearing. No resp difficulty ?HEENT: normal ?Neck: supple. no JVD. Carotids 2+ bilat; no bruits. No lymphadenopathy or thryomegaly appreciated. ?Cor: PMI nondisplaced. Regular rate & rhythm. No rubs, gallops or murmurs. ?Lungs: clear ?Abdomen: soft, nontender, nondistended. No hepatosplenomegaly. No bruits or masses. Good bowel sounds. ?Extremities: no cyanosis, clubbing, rash, edema ?Neuro: alert & orientedx3, cranial nerves grossly intact. moves all 4 extremities w/o difficulty. Affect pleasant ? ?ASSESSMENT & PLAN: ?HFrEF, NICM ?-04/12/21 ECHO EF < 20% RV normal ?-04/16/21 CMRI EF < 22% RV normal, and no LGE to suggest scar.  ?-04/16/21 Cath with mod CAD. Does not explain reduced EF. ? PVCs.  ?- NYHA II GDMT limited by hypotension ?- Echo 3/23 EF 20-25%. Discussed if EF remains < 30% will been to consider referral to EP for ICD.  ?-Reds Clip 26%. He does not need loop diuretics.  ?- Continue low dose coreg . No room to titrate.  ?- Continue losartan 12.5 qhs ?- Continue Jardiance 10 mg daily  ?- Continue 12.5 mg spironolactone daily.  ? ? ?2.PVC ?- Zio 1/23 9% PVCs  ?- EKG today. No PVCs.  ?- Continue mexilitene 200 bid ?- Consider sleep study  ? ?3. CAD ?-LHC 04/2021 with moderate non-obs CAD ?- On  aspirin.  ?- No chest pain.  ?- Discussed statin and however he declined and prefers to continue omega 3. He is concerned about side effects.  ? ?4. Blindness ? ?We will ask his PCP for lab work.  ? ? ? ?Amy Ninfa Meeker

## 2021-07-23 NOTE — Progress Notes (Signed)
ReDS Vest / Clip - 07/23/21 1500   ? ?  ? ReDS Vest / Clip  ? Station Marker D   ? Ruler Value 32   ? ReDS Value Range Low volume   ? ReDS Actual Value 26   ? ?  ?  ? ?  ? ? ?

## 2021-07-23 NOTE — Patient Instructions (Signed)
Your physician recommends that you schedule a follow-up appointment in: 6 weeks and again 3 months with echocardiogram ? ?Do the following things EVERYDAY: ?Weigh yourself in the morning before breakfast. Write it down and keep it in a log. ?Take your medicines as prescribed ?Eat low salt foods--Limit salt (sodium) to 2000 mg per day.  ?Stay as active as you can everyday ?Limit all fluids for the day to less than 2 liters ? ?If you have any questions or concerns before your next appointment please send [place] a message through [place] or call our office at [phone].   ? ?TO LEAVE A MESSAGE FOR THE NURSE SELECT OPTION 2, PLEASE LEAVE A MESSAGE INCLUDING: ?YOUR NAME ?DATE OF BIRTH ?CALL BACK NUMBER ?REASON FOR CALL**this is important as we prioritize the call backs ? ?YOU WILL RECEIVE A CALL BACK THE SAME DAY AS LONG AS YOU CALL BEFORE 4:00 PM ? ?At the [facility], you and your health needs are our priority. As part of our continuing mission to provide you with exceptional heart care, we have created designated Provider Care Teams. These Care Teams include your primary Cardiologist (physician) and Advanced Practice Providers (APPs- Physician Assistants and Nurse Practitioners) who all work together to provide you with the care you need, when you need it.  ? ?You may see any of the following providers on your designated Care Team at your next follow up: ?Dr Arvilla Meres ?Dr Marca Ancona ?Tonye Becket, NP ?Robbie Lis, PA ?Jessica Milford,NP ?Anna Genre, PA ?Karle Plumber, PharmD ? ? ?Please be sure to bring in all your medications bottles to every appointment.  ? ?

## 2021-08-11 ENCOUNTER — Other Ambulatory Visit (HOSPITAL_COMMUNITY): Payer: Self-pay

## 2021-08-11 MED ORDER — CARVEDILOL 3.125 MG PO TABS: 3.1250 mg | ORAL_TABLET | Freq: Two times a day (BID) | ORAL | 5 refills | Status: DC

## 2021-08-17 DIAGNOSIS — Z471 Aftercare following joint replacement surgery: Secondary | ICD-10-CM | POA: Diagnosis not present

## 2021-08-17 DIAGNOSIS — Z96651 Presence of right artificial knee joint: Secondary | ICD-10-CM | POA: Diagnosis not present

## 2021-09-01 NOTE — Progress Notes (Signed)
Advanced Heart Failure Clinic Note  Referring Physician: Dr Johnsie Cancel  Primary Care: Dr Gerarda Fraction Primary Cardiologist: Dr Johnsie Cancel  HF MD; Dr Haroldine Laws.   HPI: Gary Ferguson is a 66 year old with history of blindness, DVT with IVC filter, TKR , and recently diagnosed HFrEF 04/2021.    Had knee replacement 02/19/21. Post op course uncomplicated.  Completed PT 2 weeks early. 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    Admitted 04/11/20 with increased dyspnea. ECHO with reduced EF < 20%. Transferred to [facility] for HF management/cath. Diuresed with IV lasix.  Cath with mod non obstructive cad and normal filling pressures/preserved CO. CMRI with EF < 22% RV normal and now LGE to suggest scar. GDMT limited due to soft BP. D/C weight 197 pounds.   Saw Dr Haroldine Laws on 05/15/21. Had high PVC burden so mexiletine was started. Had cough so lisinopril was switched to losartan.   Today he returns for HF follow up.Overall feeling fine. Denies SOB/PND/Orthopnea. Appetite ok. No fever or chills. Weight at home 200-->198 pounds. .  Continues work in his garden. Very active at home. Taking all medications .   Zio 1/23 1. Sinus rhythm - avg HR of 90 bpm. 2. Five runs of nonsustained Ventricular Tachycardia roccurred, the run with the fastest interval lasting 4 beats with a max rate of 207 bpm, the longest lasting 5 beats with an avg rate of 169 bpm. 3. Ten runs of Supraventricular Tachycardia  occurred, the run with the fastest interval lasting 4 beats with a max rate of 182 bpm, the longest lasting 15 beats with an avg rate of 121 bpm. 4 Frequent PVCs (9.0%, 78220) - primarily one morphology 5. Ventricular Bigeminy and Trigeminy were present.   Cardiac Testing  Echo 04/12/21 EF < 20% Echo 06/2021 EF 20-25% RV normal   CMRI < 22%. Normal RV. No LGE  to suggest scar.    Cath 04/16/2021   1st [name]  lesion is 30% stenosed.   1st Diag lesion is 70% stenosed.   Prox LAD to Mid LAD lesion is 40% stenosed.   Prox RCA to Mid RCA lesion is 30% stenosed.   The left ventricular ejection fraction is less than 25% by visual estimate.  Findings:  Ao = 85/60 (70) LV = 88/19 RA = 3 RV = 26/4 PA = 29/11 (20) PCW = 16 Fick cardiac output/index = 6.6/3.2 PVR = 0.6 WU SVR = 810 FA sat = 97% PA sat = 72%, 74% SVC sat = 73%   Assessment:  1. Moderate non-obstructive CAD 2. Severe NICM EF < 20% 3. Normal filling pressures and cardiac output    Past Medical History:  Diagnosis Date   Arthritis    Chronic back pain    Complication of anesthesia    Patient said that after surgery his throat closed up in 2011   GERD (gastroesophageal reflux disease)    History of DVT of lower extremity    History of kidney stones    Hx of blood clots 2010   Macular atrophy, retinal    bilateral   Torn rotator cuff    right shoulder    Current Outpatient Medications  Medication Sig Dispense Refill   ACETAMINOPHEN PO Take 2 tablets by mouth as needed.     aspirin EC 81 MG tablet Take 81 mg by mouth daily. Swallow whole.  carvedilol (COREG) 3.125 MG tablet Take 1 tablet (3.125 mg total) by mouth 2 (two) times daily with a meal. 60 tablet 5   ciprofloxacin (CIPRO) 500 MG tablet Take 500 mg by mouth 2 (two) times daily.     clotrimazole-betamethasone (LOTRISONE) cream Apply 1 application topically 2 (two) times daily.     empagliflozin (JARDIANCE) 10 MG TABS tablet Take 1 tablet (10 mg total) by mouth daily before breakfast. 90 tablet 3   fexofenadine (ALLEGRA) 60 MG tablet Take 60 mg by mouth daily as needed.     losartan (COZAAR) 25 MG tablet Take 0.5 tablets (12.5 mg total) by mouth at bedtime. 15 tablet 3   mexiletine (MEXITIL) 200 MG capsule Take 1 capsule (200 mg total) by mouth 2 (two) times daily. 60 capsule 3   Multiple Vitamin (MULTIVITAMIN) capsule Take 1 capsule by mouth daily.     Omega-3  Fatty Acids (OMEGA 3 500 PO) Take 500 mg by mouth 2 (two) times daily.     omeprazole (PRILOSEC) 20 MG capsule Take 20 mg by mouth every morning.      spironolactone (ALDACTONE) 25 MG tablet Take 0.5 tablets (12.5 mg total) by mouth at bedtime. 15 tablet 3   No current facility-administered medications for this visit.    Allergies  Allergen Reactions   Penicillins Other (See Comments)    REACTION UNSPECIFIED  Has patient had a PCN reaction causing immediate rash, facial/tongue/throat swelling, SOB or lightheadedness with hypotension: unknown childhood allergy Has patient had a PCN reaction causing severe rash involving mucus membranes or skin necrosis: unsure Has patient had a PCN reaction that required hospitalization unsure Has patient had a PCN reaction occurring within the last 10 years: No If all of the above answers are "NO", then may proceed with Cephalosporin use.       Social History   Socioeconomic History   Marital status: Divorced    Spouse name: Not on file   Number of children: 3   Years of education: Not on file   Highest education level: Some college, no degree  Occupational History   Occupation: semi-retired    Comment: produce farmer  Tobacco Use   Smoking status: Never   Smokeless tobacco: Never  Vaping Use   Vaping Use: Never used  Substance and Sexual Activity   Alcohol use: No   Drug use: Never   Sexual activity: Not on file  Other Topics Concern   Not on file  Social History Narrative   Not on file   Social Determinants of Health   Financial Resource Strain: Low Risk    Difficulty of Paying Living Expenses: Not hard at all  Food Insecurity: No Food Insecurity   Worried About Charity fundraiser in the Last Year: Never true   [place] in the Last Year: Never true  Transportation Needs: No Transportation Needs   Lack of Transportation (Medical): No   Lack of Transportation (Non-Medical): No  Physical Activity: Not on file  Stress:  Not on file  Social Connections: Not on file  Intimate Partner Violence: Not on file      Family History  Problem Relation Age of Onset   Liver disease Mother    Hypertension Father     There were no vitals filed for this visit.   Wt Readings from Last 3 Encounters:  07/23/21 91 kg (200 lb 9.6 oz)  06/11/21 89 kg (196 lb 3.2 oz)  05/15/21 90.2 kg (198 lb 12.8  oz)   There were no vitals filed for this visit.       PHYSICAL EXAM: General:  Well appearing. No resp difficulty HEENT: normal Neck: supple. no JVD. Carotids 2+ bilat; no bruits. No lymphadenopathy or thryomegaly appreciated. Cor: PMI nondisplaced. Regular rate & rhythm. No rubs, gallops or murmurs. Lungs: clear Abdomen: soft, nontender, nondistended. No hepatosplenomegaly. No bruits or masses. Good bowel sounds. Extremities: no cyanosis, clubbing, rash, edema Neuro: alert & orientedx3, cranial nerves grossly intact. moves all 4 extremities w/o difficulty. Affect pleasant  ASSESSMENT & PLAN: HFrEF, NICM -04/12/21 ECHO EF < 20% RV normal -04/16/21 CMRI EF < 22% RV normal, and no LGE to suggest scar.  -04/16/21 Cath with mod CAD. Does not explain reduced EF. ? PVCs.  - NYHA II GDMT limited by hypotension - Echo 3/23 EF 20-25%. Discussed if EF remains < 30% will been to consider referral to EP for ICD.  -Reds Clip 26%. He does not need loop diuretics.  - Continue low dose coreg . No room to titrate.  - Continue losartan 12.5 qhs - Continue Jardiance 10 mg daily  - Continue 12.5 mg spironolactone daily.    2.PVC - Zio 1/23 9% PVCs  - EKG today. No PVCs.  - Continue mexilitene 200 bid - Consider sleep study   3. CAD -LHC 04/2021 with moderate non-obs CAD - On aspirin.  - No chest pain.  - Discussed statin and however he declined and prefers to continue omega 3. He is concerned about side effects.   4. Blindness  We will ask his PCP for lab work.     Gary Bihari, FNP  1:10 PM  Patient seen and  examined with the above-signed Advanced Practice Provider and/or Housestaff. I personally reviewed laboratory data, imaging studies and relevant notes. I independently examined the patient and formulated the important aspects of the plan. I have edited the note to reflect any of my changes or salient points. I have personally discussed the plan with the patient and/or family.  He is doing well. NYHA II. Volume status ok. BP too low to titrate meds. Echo from 1/23 EF 20-25%  PVCs now suppressed.    General:  Well appearing. No resp difficulty HEENT: normal Neck: supple. no JVD. Carotids 2+ bilat; no bruits. No lymphadenopathy or thryomegaly appreciated. Cor: PMI nondisplaced. Regular rate & rhythm. No rubs, gallops or murmurs. Lungs: clear Abdomen: soft, nontender, nondistended. No hepatosplenomegaly. No bruits or masses. Good bowel sounds. Extremities: no cyanosis, clubbing, rash, edema Neuro: alert & orientedx3, cranial nerves grossly intact x for blindness. moves all 4 extremities w/o difficulty. Affect pleasant  Doing well NYHA II. Volume status looks good. EF still < 35% despite PVC suppression. Discussed timing f referral for ICD. Will wait 3 more months to see if HE will improve. If not, will refer for ICD.  BP too low to titrate GDMT. Check labs.   Gary Bihari, FNP  1:10 PM

## 2021-09-03 ENCOUNTER — Ambulatory Visit (HOSPITAL_COMMUNITY)
Admission: RE | Admit: 2021-09-03 | Discharge: 2021-09-03 | Disposition: A | Discharge status: Home / Self Care | Payer: No Typology Code available for payment source | Source: Ambulatory Visit | Attending: Family Medicine | Admitting: Family Medicine

## 2021-09-03 ENCOUNTER — Encounter (HOSPITAL_COMMUNITY): Payer: Self-pay

## 2021-09-03 VITALS — BP 100/70 | HR 77 | Wt 203.8 lb

## 2021-09-03 DIAGNOSIS — I428 Other cardiomyopathies: Secondary | ICD-10-CM | POA: Insufficient documentation

## 2021-09-03 DIAGNOSIS — Z8249 Family history of ischemic heart disease and other diseases of the circulatory system: Secondary | ICD-10-CM | POA: Insufficient documentation

## 2021-09-03 DIAGNOSIS — I5022 Chronic systolic (congestive) heart failure: Secondary | ICD-10-CM | POA: Insufficient documentation

## 2021-09-03 DIAGNOSIS — H547 Unspecified visual loss: Secondary | ICD-10-CM | POA: Diagnosis not present

## 2021-09-03 DIAGNOSIS — I2510 Atherosclerotic heart disease of native coronary artery without angina pectoris: Secondary | ICD-10-CM | POA: Insufficient documentation

## 2021-09-03 DIAGNOSIS — Z79899 Other long term (current) drug therapy: Secondary | ICD-10-CM | POA: Insufficient documentation

## 2021-09-03 DIAGNOSIS — Z96659 Presence of unspecified artificial knee joint: Secondary | ICD-10-CM | POA: Insufficient documentation

## 2021-09-03 DIAGNOSIS — Z86718 Personal history of other venous thrombosis and embolism: Secondary | ICD-10-CM | POA: Insufficient documentation

## 2021-09-03 DIAGNOSIS — Z7984 Long term (current) use of oral hypoglycemic drugs: Secondary | ICD-10-CM | POA: Diagnosis not present

## 2021-09-03 DIAGNOSIS — I493 Ventricular premature depolarization: Secondary | ICD-10-CM | POA: Diagnosis not present

## 2021-09-03 LAB — BASIC METABOLIC PANEL
Anion gap: 5 (ref 5–15)
BUN: 15 mg/dL (ref 8–23)
CO2: 26 mmol/L (ref 22–32)
Calcium: 8.9 mg/dL (ref 8.9–10.3)
Chloride: 109 mmol/L (ref 98–111)
Creatinine, Ser: 1.17 mg/dL (ref 0.61–1.24)
GFR, Estimated: >60 mL/min (ref >60)
Glucose, Bld: 117 mg/dL — ABNORMAL HIGH (ref 70–99)
Potassium: 3.9 mmol/L (ref 3.5–5.1)
Sodium: 140 mmol/L (ref 135–145)

## 2021-09-03 MED ORDER — CARVEDILOL 3.125 MG PO TABS: 3.1250 mg | ORAL_TABLET | Freq: Two times a day (BID) | ORAL | 6 refills | Status: DC

## 2021-09-03 MED ORDER — LOSARTAN POTASSIUM 25 MG PO TABS: 12.5000 mg | ORAL_TABLET | Freq: Every day | ORAL | 3 refills | Status: DC

## 2021-09-03 MED ORDER — SPIRONOLACTONE 25 MG PO TABS
12.5000 mg | ORAL_TABLET | Freq: Every day | ORAL | 3 refills | Status: DC
Start: 2021-09-03 — End: 2022-01-06

## 2021-09-03 MED ORDER — EMPAGLIFLOZIN 10 MG PO TABS: 10.0000 mg | ORAL_TABLET | Freq: Every day | ORAL | 3 refills | Status: DC

## 2021-09-03 MED ORDER — MEXILETINE HCL 150 MG PO CAPS: 300.0000 mg | ORAL_CAPSULE | Freq: Two times a day (BID) | ORAL | 6 refills | Status: DC

## 2021-09-03 NOTE — Patient Instructions (Signed)
It was great to see you today! No medication changes are needed at this time. -all cardiology medications have been refilled  Labs today We will only contact you if something comes back abnormal or we need to make some changes. Otherwise no news is good news!  Keep cardiology follow up as scheduled   Do the following things EVERYDAY: Weigh yourself in the morning before breakfast. Write it down and keep it in a log. Take your medicines as prescribed Eat low salt foods--Limit salt (sodium) to 2000 mg per day.  Stay as active as you can everyday Limit all fluids for the day to less than 2 liters  At the [facility], you and your health needs are our priority. As part of our continuing mission to provide you with exceptional heart care, we have created designated Provider Care Teams. These Care Teams include your primary Cardiologist (physician) and Advanced Practice Providers (APPs- Physician Assistants and Nurse Practitioners) who all work together to provide you with the care you need, when you need it.   You may see any of the following providers on your designated Care Team at your next follow up: Dr Glori Bickers Dr Haynes Kerns, NP Lyda Jester, [place] [facility] [place], [place] Audry Riles, PharmD   Please be sure to bring in all your medications bottles to every appointment.

## 2021-09-09 ENCOUNTER — Telehealth (HOSPITAL_COMMUNITY): Payer: Self-pay

## 2021-09-09 NOTE — Telephone Encounter (Signed)
No answer, Left message to return call. ? ?

## 2021-09-09 NOTE — Telephone Encounter (Signed)
Patient called wanting to know if him taking tumeric could be the cause of him having an irregular heart beat.

## 2021-09-11 ENCOUNTER — Encounter (HOSPITAL_COMMUNITY): Payer: Self-pay | Admitting: *Deleted

## 2021-10-27 ENCOUNTER — Ambulatory Visit (HOSPITAL_BASED_OUTPATIENT_CLINIC_OR_DEPARTMENT_OTHER)
Admission: RE | Admit: 2021-10-27 | Discharge: 2021-10-27 | Disposition: A | Discharge status: Home / Self Care | Payer: No Typology Code available for payment source | Source: Ambulatory Visit | Attending: Internal Medicine | Admitting: Internal Medicine

## 2021-10-27 ENCOUNTER — Ambulatory Visit (HOSPITAL_COMMUNITY)
Admission: RE | Admit: 2021-10-27 | Discharge: 2021-10-27 | Disposition: A | Discharge status: Home / Self Care | Payer: No Typology Code available for payment source | Source: Ambulatory Visit | Attending: Internal Medicine | Admitting: Internal Medicine

## 2021-10-27 ENCOUNTER — Other Ambulatory Visit (HOSPITAL_COMMUNITY): Payer: Self-pay | Admitting: Internal Medicine

## 2021-10-27 ENCOUNTER — Encounter (HOSPITAL_COMMUNITY): Payer: Self-pay | Admitting: Internal Medicine

## 2021-10-27 VITALS — BP 110/70 | HR 67 | Wt 189.0 lb

## 2021-10-27 DIAGNOSIS — Z86718 Personal history of other venous thrombosis and embolism: Secondary | ICD-10-CM | POA: Insufficient documentation

## 2021-10-27 DIAGNOSIS — Z7982 Long term (current) use of aspirin: Secondary | ICD-10-CM | POA: Insufficient documentation

## 2021-10-27 DIAGNOSIS — I493 Ventricular premature depolarization: Secondary | ICD-10-CM | POA: Diagnosis not present

## 2021-10-27 DIAGNOSIS — Z7984 Long term (current) use of oral hypoglycemic drugs: Secondary | ICD-10-CM | POA: Insufficient documentation

## 2021-10-27 DIAGNOSIS — I5022 Chronic systolic (congestive) heart failure: Secondary | ICD-10-CM

## 2021-10-27 DIAGNOSIS — I2510 Atherosclerotic heart disease of native coronary artery without angina pectoris: Secondary | ICD-10-CM

## 2021-10-27 DIAGNOSIS — I428 Other cardiomyopathies: Secondary | ICD-10-CM | POA: Diagnosis not present

## 2021-10-27 DIAGNOSIS — Z79899 Other long term (current) drug therapy: Secondary | ICD-10-CM | POA: Insufficient documentation

## 2021-10-27 DIAGNOSIS — Z7901 Long term (current) use of anticoagulants: Secondary | ICD-10-CM | POA: Diagnosis not present

## 2021-10-27 DIAGNOSIS — H547 Unspecified visual loss: Secondary | ICD-10-CM | POA: Diagnosis not present

## 2021-10-27 LAB — ECHOCARDIOGRAM COMPLETE
AR max vel: 3.18 cm2
AV Peak grad: 4 mmHg
Ao pk vel: 1 m/s
Area-P 1/2: 2.87 cm2
S' Lateral: 5.3 cm
Single Plane A4C EF: 23.2 %

## 2021-10-27 LAB — COMPREHENSIVE METABOLIC PANEL
ALT: 21 U/L (ref 0–44)
AST: 25 U/L (ref 15–41)
Albumin: 4.1 g/dL (ref 3.5–5.0)
Alkaline Phosphatase: 62 U/L (ref 38–126)
Anion gap: 7 (ref 5–15)
BUN: 10 mg/dL (ref 8–23)
CO2: 25 mmol/L (ref 22–32)
Calcium: 9.5 mg/dL (ref 8.9–10.3)
Chloride: 107 mmol/L (ref 98–111)
Creatinine, Ser: 1.03 mg/dL (ref 0.61–1.24)
GFR, Estimated: >60 mL/min (ref >60)
Glucose, Bld: 109 mg/dL — ABNORMAL HIGH (ref 70–99)
Potassium: 3.9 mmol/L (ref 3.5–5.1)
Sodium: 139 mmol/L (ref 135–145)
Total Bilirubin: 0.5 mg/dL (ref 0.3–1.2)
Total Protein: 6.7 g/dL (ref 6.5–8.1)

## 2021-10-27 LAB — BRAIN NATRIURETIC PEPTIDE: B Natriuretic Peptide: 48.1 pg/mL (ref 0.0–100.0)

## 2021-10-27 NOTE — Addendum Note (Signed)
Encounter addended by: Linda Hedges, RN on: 10/27/2021 10:44 AM  Actions taken: Order list changed, Diagnosis association updated, Charge Capture section accepted, Clinical Note Signed

## 2021-10-27 NOTE — Patient Instructions (Signed)
There has been no changes to your medications.  Labs done today, your results will be available in MyChart, we will contact you for abnormal readings.  Your provider has recommended that  you wear a Zio Patch for 7 days.  This monitor will record your heart rhythm for our review.  IF you have any symptoms while wearing the monitor please press the button.  If you have any issues with the patch or you notice a red or orange light on it please call the company at [phone].  Once you remove the patch please mail it back to the company as soon as possible so we can get the results.  Your physician has requested that you have an echocardiogram. Echocardiography is a painless test that uses sound waves to create images of your heart. It provides your doctor with information about the size and shape of your heart and how well your heart's chambers and valves are working. This procedure takes approximately one hour. There are no restrictions for this procedure.  Your physician recommends that you schedule a follow-up appointment in: 4 months ( November 2023)  ** please call the in September to arrange your follow up appointment **  If you have any questions or concerns before your next appointment please send [place] a message through [place] or call our office at [phone].    TO LEAVE A MESSAGE FOR THE NURSE SELECT OPTION 2, PLEASE LEAVE A MESSAGE INCLUDING: YOUR NAME DATE OF BIRTH CALL BACK NUMBER REASON FOR CALL**this is important as we prioritize the call backs  YOU WILL RECEIVE A CALL BACK THE SAME DAY AS LONG AS YOU CALL BEFORE 4:00 PM  At the [facility], you and your health needs are our priority. As part of our continuing mission to provide you with exceptional heart care, we have created designated Provider Care Teams. These Care Teams include your primary Cardiologist (physician) and Advanced Practice Providers (APPs- Physician Assistants and Nurse Practitioners) who all  work together to provide you with the care you need, when you need it.   You may see any of the following providers on your designated Care Team at your next follow up: Dr Arvilla Meres Dr Carron Curie, NP Robbie Lis, [place] [facility] [place], [place] Karle Plumber, PharmD   Please be sure to bring in all your medications bottles to every appointment.

## 2021-10-27 NOTE — Progress Notes (Signed)
Zio patch placed onto patient.  All instructions and information reviewed with patient, they verbalize understanding with no questions. 

## 2021-10-27 NOTE — Progress Notes (Signed)
Advanced [facility] Failure Clinic Note   Primary Care: Dr Sherwood Gambler Primary Cardiologist: Dr Eden Emms  HF MD: Dr Gala Romney.   HPI: Gary Ferguson is a 66 y.o.with history of blindness, DVT with IVC filter, TKR , and recently diagnosed HFrEF 04/2021.    Had knee replacement 02/19/21. Post op course uncomplicated.  Completed PT 2 weeks early. 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    Admitted 04/11/20 with increased dyspnea. ECHO with reduced EF < 20%. Transferred to [facility] for HF management/cath. Diuresed with IV lasix.  Cath with mod non obstructive cad and normal filling pressures/preserved CO. CMRI with EF < 22% RV normal and now LGE to suggest scar. GDMT limited due to soft BP. D/C weight 197 pounds.   Saw Dr Gala Romney on 05/15/21. Had high PVC burden so mexiletine was started. Had cough so lisinopril was switched to losartan.   Echo 3/23 with EF 20-25%, RV ok  Follow up 4/23, NYHA II, volume good. GDMT limited by low BP.   Today he returns for HF follow up. Feels good. Working every day on the farm. Says he is working harder than he has in the last 10 year. No CP. Does get SOB when he pushes himself. No edema, orthopnea or PND. Complaint with meds.   Echo today 10/27/21 EF 20-25% RV normal Personally reviewed  Zio 1/23 1. Sinus rhythm - avg HR of 90 bpm. 2. Five runs of nonsustained Ventricular Tachycardia roccurred, the run with the fastest interval lasting 4 beats with a max rate of 207 bpm, the longest lasting 5 beats with an avg rate of 169 bpm. 3. Ten runs of Supraventricular Tachycardia  occurred, the run with the fastest interval lasting 4 beats with a max rate of 182 bpm, the longest lasting 15 beats with an avg rate of 121 bpm. 4 Frequent PVCs (9.0%, 78220) - primarily one morphology 5. Ventricular Bigeminy and Trigeminy were present.   Cardiac Testing  - Echo  (3/23): EF 20-25% RV normal   - Echo (1/23): EF < 20%  - CMRI (1/23): LVEF < 22%. Normal RV. No LGE  to suggest scar.    - Cath (1/23):  1st [name] lesion is 30% stenosed.   1st Diag lesion is 70% stenosed.   Prox LAD to Mid LAD lesion is 40% stenosed.   Prox RCA to Mid RCA lesion is 30% stenosed.   The left ventricular ejection fraction is less than 25% by visual estimate.   Ao = 85/60 (70) LV = 88/19 RA = 3 RV = 26/4 PA = 29/11 (20) PCW = 16 Fick cardiac output/index = 6.6/3.2 PVR = 0.6 WU SVR = 810 FA sat = 97% PA sat = 72%, 74% SVC sat = 73%    1. Moderate non-obstructive CAD 2. Severe NICM EF < 20% 3. Normal filling pressures and cardiac output  Past Medical History:  Diagnosis Date   Arthritis    Chronic back pain    Complication of anesthesia    Patient said that after surgery his throat closed up in 2011   GERD (gastroesophageal reflux disease)    History of DVT of lower extremity    History of kidney stones    Hx of blood clots 2010   Macular atrophy, retinal    bilateral   Torn rotator cuff    right shoulder   Current Outpatient Medications  Medication Sig Dispense Refill   ACETAMINOPHEN PO Take 2 tablets by mouth as needed.     aspirin EC 81 MG tablet Take 81 mg by mouth daily. Swallow whole.     carvedilol (COREG) 3.125 MG tablet Take 1 tablet (3.125 mg total) by mouth 2 (two) times daily with a meal. 60 tablet 6   ciprofloxacin (CIPRO) 500 MG tablet Take 500 mg by mouth 2 (two) times daily.     clotrimazole-betamethasone (LOTRISONE) cream Apply 1 application topically 2 (two) times daily.     empagliflozin (JARDIANCE) 10 MG TABS tablet Take 1 tablet (10 mg total) by mouth daily before breakfast. 90 tablet 3   fexofenadine (ALLEGRA) 60 MG tablet Take 60 mg by mouth daily as needed.     losartan (COZAAR) 25 MG tablet Take 0.5 tablets (12.5 mg total) by mouth at bedtime. 15 tablet 3   mexiletine (MEXITIL) 150 MG capsule Take 150 mg by mouth 2 (two) times  daily.     Multiple Vitamin (MULTIVITAMIN) capsule Take 1 capsule by mouth daily.     Omega 3 1000 MG CAPS Take 1 capsule by mouth daily in the afternoon.     omeprazole (PRILOSEC) 20 MG capsule Take 20 mg by mouth every morning.      spironolactone (ALDACTONE) 25 MG tablet Take 0.5 tablets (12.5 mg total) by mouth at bedtime. 15 tablet 3   No current facility-administered medications for this encounter.   Allergies  Allergen Reactions   Penicillins Other (See Comments)    REACTION UNSPECIFIED  Has patient had a PCN reaction causing immediate rash, facial/tongue/throat swelling, SOB or lightheadedness with hypotension: unknown childhood allergy Has patient had a PCN reaction causing severe rash involving mucus membranes or skin necrosis: unsure Has patient had a PCN reaction that required hospitalization unsure Has patient had a PCN reaction occurring within the last 10 years: No If all of the above answers are "NO", then may proceed with Cephalosporin use.    Social History   Socioeconomic History   Marital status: Divorced    Spouse name: Not on file   Number of children: 3   Years of education: Not on file   Highest education level: Some college, no degree  Occupational History   Occupation: semi-retired    Comment: produce farmer  Tobacco Use   Smoking status: Never   Smokeless tobacco: Never  Vaping Use   Vaping Use: Never used  Substance and Sexual Activity   Alcohol use: No   Drug use: Never   Sexual activity: Not on file  Other Topics Concern   Not on file  Social History Narrative   Not on file   Social Determinants of Health   Financial Resource Strain: Low Risk  (04/13/2021)   Overall Financial Resource Strain (CARDIA)    Difficulty of Paying Living Expenses: Not hard at all  Food Insecurity: No Food Insecurity (04/13/2021)   Hunger Vital Sign    Worried About Running Out of Food in the Last Year: Never true    Ran Out of Food in the Last Year: Never true   Transportation Needs: No Transportation Needs (04/13/2021)   PRAPARE - Administrator, Civil Service (Medical): No    Lack of Transportation (Non-Medical): No  Physical Activity: Not on file  Stress: Not on file  Social Connections: Not on file  Intimate Partner Violence: Not on file   Family History  Problem Relation Age of Onset   Liver disease Mother  Hypertension Father    BP 110/70   Pulse 67   Wt 85.7 kg (189 lb)   SpO2 98%   BMI 27.12 kg/m   Wt Readings from Last 3 Encounters:  10/27/21 85.7 kg (189 lb)  09/03/21 92.4 kg (203 lb 12.8 oz)  07/23/21 91 kg (200 lb 9.6 oz)   PHYSICAL EXAM: General:  Well appearing. No resp difficulty HEENT: normal Neck: supple. no JVD. Carotids 2+ bilat; no bruits. No lymphadenopathy or thryomegaly appreciated. Cor: PMI nondisplaced. Regular rate & rhythm. No rubs, gallops or murmurs. Lungs: clear Abdomen: soft, nontender, nondistended. No hepatosplenomegaly. No bruits or masses. Good bowel sounds. Extremities: no cyanosis, clubbing, rash, edema Neuro: alert & orientedx3, cranial nerves grossly intact. moves all 4 extremities w/o difficulty. Affect pleasant   ECG (personally reviewed): Sinus brady 58 no pvcs. Personally reviewed  ASSESSMENT & PLAN: HFrEF, NICM - Echo (1/23): EF < 20% RV normal - CMRI (1/23): EF < 22% RV normal, and no LGE to suggest scar.  - Cath (1/23): with mod CAD. Does not explain reduced EF. ? PVCs.  - Echo (3/23): EF 20-25%.  - NYHA II, volume looks good today. GDMT limited by hypotension - He does not need loop diuretics.  - Continue Coreg 3.125 mg bid. No room to titrate.  - Increase losartan to 25 mg qhs. Watch BP  - Continue Jardiance 10 mg daily.  - Continue spironolactone 12.5 mg daily.  - Labs today. - Seems like PVCs now fully suppressed. Will continue current therapy for 3-4 more months. Rpeat Echo to see if EF improving prior to considering ICD implant - Repeat zio to ensure PVC  suppression  2. PVC - Zio (1/23) 9% PVCs  - Frequent PVCs on ECG today. Asymptomatic. - Continue mexilitene 150 mg bid. - We discussed sleep study, he declined. - Repeat zio to ensure PVC suppression  3. CAD - LHC (04/2021) with moderate non-obs CAD - No angina  - On ASA, declines starting statin. He is concerned about side effects.  4. Blindness  Arvilla Meres, MD  10:30 AM

## 2021-10-27 NOTE — Addendum Note (Signed)
Encounter addended by: Suezanne Cheshire, RN on: 10/27/2021 11:19 AM  Actions taken: Clinical Note Signed

## 2021-10-28 ENCOUNTER — Telehealth (HOSPITAL_COMMUNITY): Payer: Self-pay | Admitting: *Deleted

## 2021-10-28 NOTE — Telephone Encounter (Signed)
Pt left vm stating his zio was placed yesterday and it is falling off. Pt said he didn't think it would stick because he works outside a lot and gets sweaty. Called pt back to see if we could place a new one and no answer.

## 2021-10-29 NOTE — Telephone Encounter (Signed)
Spoke with pt he said he does not want to try another zio patch because he knows it will fall off because he sweats too much working outside. Pt asked if there was another test that he needed.   Routed to Dr.Bensimhon

## 2021-11-10 ENCOUNTER — Telehealth (HOSPITAL_COMMUNITY): Payer: Self-pay

## 2021-11-10 NOTE — Telephone Encounter (Signed)
Received a fax requesting medical records from [facility]: Raynelle Fanning Case, Records were successfully faxed to: 11/10/21 ,which was the number provided.. Medical request form will be scanned into patients chart.

## 2021-12-04 DIAGNOSIS — Z96651 Presence of right artificial knee joint: Secondary | ICD-10-CM | POA: Diagnosis not present

## 2021-12-04 DIAGNOSIS — Z471 Aftercare following joint replacement surgery: Secondary | ICD-10-CM | POA: Diagnosis not present

## 2021-12-04 DIAGNOSIS — M1712 Unilateral primary osteoarthritis, left knee: Secondary | ICD-10-CM | POA: Diagnosis not present

## 2021-12-09 ENCOUNTER — Telehealth (HOSPITAL_COMMUNITY): Payer: Self-pay | Admitting: Surgery

## 2021-12-09 NOTE — Telephone Encounter (Signed)
I called Gary Ferguson in reference to his Zio monitor status.  I see from previous notes and he tells me as well that he was unable to complete the study secondary to working outside and being "drenched with sweat".  The device fell off.  He defers another device application at this time and wants to wait until it is cooler perhaps.  He returns for appt in Nov.

## 2021-12-10 DIAGNOSIS — Z9889 Other specified postprocedural states: Secondary | ICD-10-CM | POA: Diagnosis not present

## 2021-12-10 DIAGNOSIS — M5136 Other intervertebral disc degeneration, lumbar region: Secondary | ICD-10-CM | POA: Diagnosis not present

## 2021-12-10 DIAGNOSIS — M5416 Radiculopathy, lumbar region: Secondary | ICD-10-CM | POA: Diagnosis not present

## 2021-12-22 DIAGNOSIS — I493 Ventricular premature depolarization: Secondary | ICD-10-CM | POA: Diagnosis not present

## 2021-12-24 NOTE — Addendum Note (Signed)
Encounter addended by: Micki Riley, RN on: 12/24/2021 3:24 PM  Actions taken: Imaging Exam ended

## 2022-01-04 DIAGNOSIS — Z6829 Body mass index (BMI) 29.0-29.9, adult: Secondary | ICD-10-CM | POA: Diagnosis not present

## 2022-01-04 DIAGNOSIS — L209 Atopic dermatitis, unspecified: Secondary | ICD-10-CM | POA: Diagnosis not present

## 2022-01-04 DIAGNOSIS — E663 Overweight: Secondary | ICD-10-CM | POA: Diagnosis not present

## 2022-01-04 DIAGNOSIS — N1831 Chronic kidney disease, stage 3a: Secondary | ICD-10-CM | POA: Diagnosis not present

## 2022-01-04 DIAGNOSIS — R051 Acute cough: Secondary | ICD-10-CM | POA: Diagnosis not present

## 2022-01-06 ENCOUNTER — Other Ambulatory Visit (HOSPITAL_COMMUNITY): Payer: Self-pay | Admitting: Family Medicine

## 2022-01-19 DIAGNOSIS — M5416 Radiculopathy, lumbar region: Secondary | ICD-10-CM | POA: Diagnosis not present

## 2022-01-19 DIAGNOSIS — M545 Low back pain, unspecified: Secondary | ICD-10-CM | POA: Diagnosis not present

## 2022-01-26 DIAGNOSIS — M545 Low back pain, unspecified: Secondary | ICD-10-CM | POA: Diagnosis not present

## 2022-01-26 DIAGNOSIS — M5416 Radiculopathy, lumbar region: Secondary | ICD-10-CM | POA: Diagnosis not present

## 2022-02-10 DIAGNOSIS — E261 Secondary hyperaldosteronism: Secondary | ICD-10-CM | POA: Diagnosis not present

## 2022-02-10 DIAGNOSIS — Z6827 Body mass index (BMI) 27.0-27.9, adult: Secondary | ICD-10-CM | POA: Diagnosis not present

## 2022-02-10 DIAGNOSIS — I493 Ventricular premature depolarization: Secondary | ICD-10-CM | POA: Diagnosis not present

## 2022-02-10 DIAGNOSIS — Z008 Encounter for other general examination: Secondary | ICD-10-CM | POA: Diagnosis not present

## 2022-02-10 DIAGNOSIS — E663 Overweight: Secondary | ICD-10-CM | POA: Diagnosis not present

## 2022-02-10 DIAGNOSIS — I5020 Unspecified systolic (congestive) heart failure: Secondary | ICD-10-CM | POA: Diagnosis not present

## 2022-02-17 ENCOUNTER — Ambulatory Visit (HOSPITAL_BASED_OUTPATIENT_CLINIC_OR_DEPARTMENT_OTHER)
Admission: RE | Admit: 2022-02-17 | Discharge: 2022-02-17 | Disposition: A | Discharge status: Home / Self Care | Payer: No Typology Code available for payment source | Source: Ambulatory Visit | Attending: Family Medicine | Admitting: Family Medicine

## 2022-02-17 ENCOUNTER — Encounter (HOSPITAL_COMMUNITY): Payer: Self-pay

## 2022-02-17 ENCOUNTER — Ambulatory Visit (HOSPITAL_COMMUNITY)
Admission: RE | Admit: 2022-02-17 | Discharge: 2022-02-17 | Disposition: A | Discharge status: Home / Self Care | Payer: No Typology Code available for payment source | Source: Ambulatory Visit | Attending: Family Medicine | Admitting: Family Medicine

## 2022-02-17 VITALS — BP 116/80 | HR 61 | Wt 195.4 lb

## 2022-02-17 DIAGNOSIS — I493 Ventricular premature depolarization: Secondary | ICD-10-CM | POA: Insufficient documentation

## 2022-02-17 DIAGNOSIS — I428 Other cardiomyopathies: Secondary | ICD-10-CM | POA: Diagnosis not present

## 2022-02-17 DIAGNOSIS — I2510 Atherosclerotic heart disease of native coronary artery without angina pectoris: Secondary | ICD-10-CM | POA: Insufficient documentation

## 2022-02-17 DIAGNOSIS — H543 Unqualified visual loss, both eyes: Secondary | ICD-10-CM

## 2022-02-17 DIAGNOSIS — I5022 Chronic systolic (congestive) heart failure: Secondary | ICD-10-CM | POA: Insufficient documentation

## 2022-02-17 DIAGNOSIS — H547 Unspecified visual loss: Secondary | ICD-10-CM | POA: Insufficient documentation

## 2022-02-17 DIAGNOSIS — Z79899 Other long term (current) drug therapy: Secondary | ICD-10-CM | POA: Insufficient documentation

## 2022-02-17 DIAGNOSIS — Z7984 Long term (current) use of oral hypoglycemic drugs: Secondary | ICD-10-CM | POA: Insufficient documentation

## 2022-02-17 DIAGNOSIS — R059 Cough, unspecified: Secondary | ICD-10-CM | POA: Diagnosis not present

## 2022-02-17 DIAGNOSIS — Z86718 Personal history of other venous thrombosis and embolism: Secondary | ICD-10-CM | POA: Diagnosis not present

## 2022-02-17 LAB — ECHOCARDIOGRAM COMPLETE
Area-P 1/2: 2.82 cm2
Calc EF: 43.9 %
MV VTI: 2.29 cm2
S' Lateral: 4.5 cm
Single Plane A2C EF: 43.7 %
Single Plane A4C EF: 46.3 %

## 2022-02-17 LAB — COMPREHENSIVE METABOLIC PANEL
ALT: 22 U/L (ref 0–44)
AST: 24 U/L (ref 15–41)
Albumin: 3.9 g/dL (ref 3.5–5.0)
Alkaline Phosphatase: 58 U/L (ref 38–126)
Anion gap: 7 (ref 5–15)
BUN: 11 mg/dL (ref 8–23)
CO2: 25 mmol/L (ref 22–32)
Calcium: 9.1 mg/dL (ref 8.9–10.3)
Chloride: 109 mmol/L (ref 98–111)
Creatinine, Ser: 1.12 mg/dL (ref 0.61–1.24)
GFR, Estimated: >60 mL/min (ref >60)
Glucose, Bld: 105 mg/dL — ABNORMAL HIGH (ref 70–99)
Potassium: 3.8 mmol/L (ref 3.5–5.1)
Sodium: 141 mmol/L (ref 135–145)
Total Bilirubin: 0.6 mg/dL (ref 0.3–1.2)
Total Protein: 6.5 g/dL (ref 6.5–8.1)

## 2022-02-17 NOTE — Progress Notes (Signed)
Advanced Heart Failure Clinic Note   Primary Care: Dr Sherwood Gambler Primary Cardiologist: Dr Eden Emms  HF Cardiologist: Dr Gala Romney.   HPI: Gary Ferguson is a 66 y.o.with history of blindness, DVT with IVC filter, TKR , and recently diagnosed HFrEF 04/2021.    Had knee replacement 02/19/21. Post op course uncomplicated.  Completed PT 2 weeks early. 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    Admitted 04/11/20 with increased dyspnea. ECHO with reduced EF < 20%. Transferred to [facility] for HF management/cath. Diuresed with IV lasix.  Cath with mod non obstructive cad and normal filling pressures/preserved CO. CMRI with EF < 22% RV normal and now LGE to suggest scar. GDMT limited due to soft BP. D/C weight 197 pounds.   Saw Dr Gala Romney on 05/15/21. Had high PVC burden so mexiletine was started. Had cough so lisinopril was switched to losartan.   Echo 3/23 with EF 20-25%, RV ok  Echo 10/27/21 EF 20-25% RV normal. Repeat Zio placed to ensure PVCs adequately suppressed with plans to repeat echo, then refer for ICD if EF remains < 35%.  Zio (9/23) showed 1% PVC burden, mostly NSR.   Today he returns for HF follow up. Overall feeling fine. 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 Limited by knee OA. Denies palpitations, abnormal bleeding, CP, dizziness, edema, or PND/Orthopnea. Appetite ok. No fever or chills. Weight at home 189 pounds. Taking all medications. He had a confusion over mexilitine, had only been taking 150 daily up until 3 weeks ago, now taking 300 mg bid.  Echo today 02/17/22, results pending.  Cardiac Testing   - Zio (9/23): mostly NSR, 1% PVC burden  - Echo (7/23): EF 20-25%, RV ok  - Echo (3/23): EF 20-25% RV normal   - Zio (1/23): mostly NSR, 5 runs NSVT, frequent PVCs (9% burden)  - Echo (1/23): EF < 20%  - CMRI  (1/23): LVEF < 22%. Normal RV. No LGE  to suggest scar.    - Cath (1/23):  1st [name] lesion is 30% stenosed.   1st Diag lesion is 70% stenosed.   Prox LAD to Mid LAD lesion is 40% stenosed.   Prox RCA to Mid RCA lesion is 30% stenosed.   The left ventricular ejection fraction is less than 25% by visual estimate.   Ao = 85/60 (70) LV = 88/19 RA = 3 RV = 26/4 PA = 29/11 (20) PCW = 16 Fick cardiac output/index = 6.6/3.2 PVR = 0.6 WU SVR = 810 FA sat = 97% PA sat = 72%, 74% SVC sat = 73%    1. Moderate non-obstructive CAD 2. Severe NICM EF < 20% 3. Normal filling pressures and cardiac output  Past Medical History:  Diagnosis Date   Arthritis    Chronic back pain    Complication of anesthesia    Patient said that after surgery his throat closed up in 2011   GERD (gastroesophageal reflux disease)    History of DVT of lower extremity    History of kidney stones    Hx of blood clots 2010   Macular atrophy, retinal    bilateral   Torn rotator cuff    right shoulder   Current Outpatient Medications  Medication Sig Dispense Refill   ACETAMINOPHEN PO Take 2 tablets by mouth as needed.  aspirin EC 81 MG tablet Take 81 mg by mouth daily. Swallow whole.     carvedilol (COREG) 3.125 MG tablet Take 1 tablet (3.125 mg total) by mouth 2 (two) times daily with a meal. 60 tablet 6   ciprofloxacin (CIPRO) 500 MG tablet Take 500 mg by mouth 2 (two) times daily.     clotrimazole-betamethasone (LOTRISONE) cream Apply 1 application topically 2 (two) times daily.     empagliflozin (JARDIANCE) 10 MG TABS tablet Take 1 tablet (10 mg total) by mouth daily before breakfast. 90 tablet 3   fexofenadine (ALLEGRA) 60 MG tablet Take 60 mg by mouth daily as needed.     losartan (COZAAR) 25 MG tablet TAKE ONE-HALF TABLET (12.5MG  TOTAL) BY MOUTH AT BEDTIME 45 tablet 3   mexiletine (MEXITIL) 150 MG capsule Patient takes 2 capsules by mouth twice a day.     Multiple Vitamin (MULTIVITAMIN) capsule Take 1  capsule by mouth daily.     Omega-3 Fatty Acids (OMEGA 3 500 PO) Patient takes 2 capsule by mouth once a day.     omeprazole (PRILOSEC) 20 MG capsule Take 20 mg by mouth every morning.      spironolactone (ALDACTONE) 25 MG tablet TAKE ONE-HALF TABLET (12.5MG  TOTAL) BY MOUTH AT BEDTIME 45 tablet 3   No current facility-administered medications for this encounter.   Allergies  Allergen Reactions   Penicillins Other (See Comments)    REACTION UNSPECIFIED  Has patient had a PCN reaction causing immediate rash, facial/tongue/throat swelling, SOB or lightheadedness with hypotension: unknown childhood allergy Has patient had a PCN reaction causing severe rash involving mucus membranes or skin necrosis: unsure Has patient had a PCN reaction that required hospitalization unsure Has patient had a PCN reaction occurring within the last 10 years: No If all of the above answers are "NO", then may proceed with Cephalosporin use.    Social History   Socioeconomic History   Marital status: Divorced    Spouse name: Not on file   Number of children: 3   Years of education: Not on file   Highest education level: Some college, no degree  Occupational History   Occupation: semi-retired    Comment: produce farmer  Tobacco Use   Smoking status: Never   Smokeless tobacco: Never  Vaping Use   Vaping Use: Never used  Substance and Sexual Activity   Alcohol use: No   Drug use: Never   Sexual activity: Not on file  Other Topics Concern   Not on file  Social History Narrative   Not on file   Social Determinants of Health   Financial Resource Strain: Low Risk  (04/13/2021)   Overall Financial Resource Strain (CARDIA)    Difficulty of Paying Living Expenses: Not hard at all  Food Insecurity: No Food Insecurity (04/13/2021)   Hunger Vital Sign    Worried About Running Out of Food in the Last Year: Never true    Ran Out of Food in the Last Year: Never true  Transportation Needs: No Transportation  Needs (04/13/2021)   PRAPARE - Administrator, Civil Service (Medical): No    Lack of Transportation (Non-Medical): No  Physical Activity: Not on file  Stress: Not on file  Social Connections: Not on file  Intimate Partner Violence: Not on file   Family History  Problem Relation Age of Onset   Liver disease Mother    Hypertension Father    BP 116/80   Pulse 61   Wt 88.6 kg (  195 lb 6.4 oz)   SpO2 97%   BMI 28.04 kg/m   Wt Readings from Last 3 Encounters:  02/17/22 88.6 kg (195 lb 6.4 oz)  10/27/21 85.7 kg (189 lb)  09/03/21 92.4 kg (203 lb 12.8 oz)   PHYSICAL EXAM: General:  NAD. No resp difficulty HEENT: Blind Neck: Supple. No JVD. Carotids 2+ bilat; no bruits. No lymphadenopathy or thryomegaly appreciated. Cor: PMI nondisplaced. Regular rate & rhythm. No rubs, gallops or murmurs. Lungs: Clear Abdomen: Soft, nontender, nondistended. No hepatosplenomegaly. No bruits or masses. Good bowel sounds. Extremities: No cyanosis, clubbing, rash, edema Neuro: Alert & oriented x 3, cranial nerves grossly intact. Moves all 4 extremities w/o difficulty. Affect pleasant.  ECG (personally reviewed): Sinus bradycardia 57 bpm  ASSESSMENT & PLAN: HFrEF, NICM - Echo (1/23): EF < 20% RV normal - CMRI (1/23): EF < 22% RV normal, and no LGE to suggest scar.  - Cath (1/23): with mod CAD. Does not explain reduced EF. ? PVCs.  - Echo (3/23): EF 20-25%.  - Echo (7/23): EF 20-25%, RV ok - NYHA II, volume looks good today. GDMT limited by hypotension - He does not need loop diuretics.  - Continue Coreg 3.125 mg bid. No room to titrate.  - Continue losartan 12.5 mg qhs. - Continue Jardiance 10 mg daily.  - Continue spironolactone 12.5 mg daily.  - Labs today. - Echo today results pending, If EF < 35% refer to EP for ICD. We discussed this today.  2. PVC - Zio (1/23) 9% PVCs  - Repeat zio (9/23) showed 1.1% PVC burden, mostly NSR (only wore for 1 day) - Continue mexilitene 300 mg  bid. - None on ECG today. - We discussed sleep study, he declined.  3. CAD - LHC (04/2021) with moderate non-obs CAD - No angina  - On ASA, declines starting statin. He is concerned about side effects.  4. Blindness  Follow up in 4 months with Dr. Gala Romney.  Jacklynn Ganong, FNP  10:27 AM

## 2022-02-17 NOTE — Patient Instructions (Signed)
It was great to see you today! No medication changes are needed at this time.   Labs today We will only contact you if something comes back abnormal or we need to make some changes. Otherwise no news is good news!  Your physician wants you to follow-up in: 4 months with Dr Bensimhon. You will receive a reminder letter in the mail two months in advance. If you don't receive a letter, please call our office to schedule the follow-up appointment.    Do the following things EVERYDAY: Weigh yourself in the morning before breakfast. Write it down and keep it in a log. Take your medicines as prescribed Eat low salt foods--Limit salt (sodium) to 2000 mg per day.  Stay as active as you can everyday Limit all fluids for the day to less than 2 liters  At the [facility], you and your health needs are our priority. As part of our continuing mission to provide you with exceptional heart care, we have created designated Provider Care Teams. These Care Teams include your primary Cardiologist (physician) and Advanced Practice Providers (APPs- Physician Assistants and Nurse Practitioners) who all work together to provide you with the care you need, when you need it.   You may see any of the following providers on your designated Care Team at your next follow up: Dr Daniel Bensimhon Dr Dalton McLean Dr. Aditya Sabharwal Amy Clegg, NP Brittainy Simmons, PA Jessica Milford,NP Lindsay Finch, PA Alma Diaz, NP Lauren Kemp, PharmD   Please be sure to bring in all your medications bottles to every appointment.   If you have any questions or concerns before your next appointment please send us a message through mychart or call our office at [phone].    TO LEAVE A MESSAGE FOR THE NURSE SELECT OPTION 2, PLEASE LEAVE A MESSAGE INCLUDING: YOUR NAME DATE OF BIRTH CALL BACK NUMBER REASON FOR CALL**this is important as we prioritize the call backs  YOU WILL RECEIVE A CALL BACK THE  SAME DAY AS LONG AS YOU CALL BEFORE 4:00 PM  

## 2022-02-17 NOTE — Progress Notes (Signed)
  Echocardiogram 2D Echocardiogram has been performed.  Milda Smart 02/17/2022, 9:56 AM

## 2022-02-19 DIAGNOSIS — Z471 Aftercare following joint replacement surgery: Secondary | ICD-10-CM | POA: Diagnosis not present

## 2022-02-19 DIAGNOSIS — Z96651 Presence of right artificial knee joint: Secondary | ICD-10-CM | POA: Diagnosis not present

## 2022-02-19 DIAGNOSIS — M1712 Unilateral primary osteoarthritis, left knee: Secondary | ICD-10-CM | POA: Diagnosis not present

## 2022-02-22 ENCOUNTER — Telehealth: Payer: Self-pay | Admitting: *Deleted

## 2022-02-22 DIAGNOSIS — Z880 Allergy status to penicillin: Secondary | ICD-10-CM | POA: Diagnosis not present

## 2022-02-22 DIAGNOSIS — Z9889 Other specified postprocedural states: Secondary | ICD-10-CM | POA: Diagnosis not present

## 2022-02-22 DIAGNOSIS — M5116 Intervertebral disc disorders with radiculopathy, lumbar region: Secondary | ICD-10-CM | POA: Diagnosis not present

## 2022-02-22 DIAGNOSIS — M5136 Other intervertebral disc degeneration, lumbar region: Secondary | ICD-10-CM | POA: Diagnosis not present

## 2022-02-22 DIAGNOSIS — M5416 Radiculopathy, lumbar region: Secondary | ICD-10-CM | POA: Diagnosis not present

## 2022-02-22 NOTE — Telephone Encounter (Signed)
   Name: Gary Ferguson  DOB: May 26, 1955  MRN: [id number]  Primary Cardiologist: Arvilla Meres, MD   Preoperative team, please contact this patient and set up a phone call appointment for further preoperative risk assessment. Please obtain consent and complete medication review. Thank you for your help.  I confirm that guidance regarding antiplatelet and oral anticoagulation therapy has been completed and, if necessary, noted below.  His aspirin may be held for 5 to 7 days prior to his procedure.  Please resume as soon as hemostasis is achieved   Ronney Asters, NP 02/22/2022, 11:07 AM [facility]

## 2022-02-22 NOTE — Telephone Encounter (Signed)
   Pre-operative Risk Assessment    Patient Name: Gary Ferguson  DOB: 21-Mar-1956 MRN: [id number]      Request for Surgical Clearance    Procedure:   LEFT TOTAL KNEE ARTHROPLASTY  Date of Surgery:  Clearance TBD                                 Surgeon:  DR. [name] Surgeon's Group or Practice Name:  [facility] Phone number:  [phone] Fax number:  [phone] ATTN: KERRI MAZE   Type of Clearance Requested:   - Medical ; ASA    Type of Anesthesia:  Spinal   Additional requests/questions:    Gary Ferguson   02/22/2022, 10:41 AM

## 2022-02-22 NOTE — Telephone Encounter (Signed)
I just s/w the pt and he tells me he just saw Dr. Gala Romney last week. Pt also states he is waiting to hear about his echo results as well. I assured the pt that I will reach out to Dr. Gala Romney in regard to pre op clearance and that I will also let their office know that he is waiting to hear about his echo results. Pt I assured the pt that I will call him back in regard to the pre op clearance and his nurse will back about the echo. Pt thanked me for the help.   Dr. Gala Romney, needing to confirm if you have cleared the pt and if so will need to have your ov note amended as well as recommendations for hold ASA 5-7 days prior. Once pt has been cleared I will be happy to fax notes to the surgeon office.

## 2022-02-23 ENCOUNTER — Telehealth (HOSPITAL_COMMUNITY): Payer: Self-pay

## 2022-02-23 NOTE — Telephone Encounter (Signed)
Received a fax requesting medical records from [facility]. Records were successfully faxed to: [phone] ,which was the number provided.. Medical request form will be scanned into patients chart.

## 2022-03-01 NOTE — Telephone Encounter (Signed)
Kerri, surgery scheduler called checking if pt has been cleared. I assured Lynnea Ferrier that I will send a message tp Dr. Gala Romney and the pre op team to review if Dr. Gala Romney has cleared the pt. Once the pt has been cleared our office will fax notes to surgeon office.

## 2022-03-01 NOTE — Telephone Encounter (Signed)
Helping in preop today. Chart reviewed - as below, awaiting input from Dr. Gala Romney. Echo result note not yet completed so appreciate MD thoughts on surgical clearance as requested below for left total knee surgery. Dr. Gala Romney. - Please route response to P CV DIV PREOP (the pre-op pool). Thank you. (Already routed to MD below so will note route second time to avoid duplicate in inbox.)

## 2022-03-02 NOTE — Telephone Encounter (Signed)
° °  Pt is calling back to f/u °

## 2022-03-02 NOTE — Telephone Encounter (Signed)
Patient is returning call.  °

## 2022-03-02 NOTE — Telephone Encounter (Signed)
Pt is calling back to s/w the pre op provider. I will forward the call to the pre op pool.

## 2022-03-02 NOTE — Telephone Encounter (Signed)
Attempted to reach out to the patient to discuss risk. I left a VM.   Per Dr. Gala Romney: Given low EF would be at moderate to high risk for peri-op CV complications with surgery.  Nonobstructive CAD by heart cath, on ASA.

## 2022-03-03 NOTE — Telephone Encounter (Signed)
     Primary Cardiologist: Arvilla Meres, MD  Chart reviewed as part of pre-operative protocol coverage. Given past medical history and time since last visit, based on ACC/AHA guidelines, Gary Ferguson would be at acceptable risk for the planned procedure without further cardiovascular testing.   Patient was advised that if he develops new symptoms prior to surgery to contact our office to arrange a follow-up appointment.  He verbalized understanding.  Due to his low ejection fraction he is considered to be a moderate risk-high risk for perioperative cardiovascular complications.  His aspirin may be held for 5 to 7 days prior to his procedure.  Please resume as soon as hemostasis is achieved.  I will route this recommendation to the requesting party via Epic fax function and remove from pre-op pool.  Please call with questions.  Thomasene Ripple. Jamita Mckelvin NP-C     03/03/2022, 8:18 AM [facility] [address] Office [phone] Fax [phone]

## 2022-03-03 NOTE — Telephone Encounter (Signed)
Patient returned Angie's call.

## 2022-03-07 NOTE — Progress Notes (Deleted)
Cardiology Office Note:    Date:  03/07/2022   ID:  Gary Ferguson, DOB 1956-02-29, MRN [id number]  PCP:  Elfredia Nevins, MD   [facility] Providers Cardiologist:  Arvilla Meres, MD { Click to update primary MD,subspecialty MD or APP then REFRESH:1}    Referring MD: Elfredia Nevins, MD   Chief Complaint: ***  History of Present Illness:    Gary Ferguson is a *** 66 y.o. male with a hx of blindness, DVT with IVC filter,   Had knee replacement 02/19/2021 with uncomplicated postop course.  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  The week prior to Christmas 2022 he did developed a cough that got progressively worse with increased shortness of breath.  Admission 04/11/2021 with increased dyspnea.  Echo with reduced EF less than 20%.  Transferred to [facility] for HF management/cath.  Diuresed with IV Lasix.  Cath with moderate nonobstructive coronary artery disease on normal filling pressures/preserved CO.  CMRI with EF less than 22% RV normal and low LGE to suggest scar.  GDMT limited due to soft BP.  Seen in follow-up by Dr. Gala Romney on 05/15/2021 with high PVC burden. Mexiletine was started and he was switched from lisinopril to losartan due to cough.  Echo 06/2021 with EF 20 to 25%, RV okay.  Echo 10/27/2021 EF 20 to 25%, RV normal.  Repeat ZIO to ensure PVCs adequately suppressed with plans to repeat echo then refer for ICD if EF remains less than 35%.  Zio patch 12/2021 showed 1% PVC burden, mostly NSR.   Seen by HF NP on 02/17/22, overall feeling fine. Had confusion regarding mexilitine dosing and had only been taking 150 mg daily until 3 weeks prior to the visit at which time he started 300 mg bid.    Past Medical History:  Diagnosis Date   Arthritis    Chronic back pain    Complication of anesthesia    Patient said that after surgery his throat closed up in 2011   GERD (gastroesophageal reflux disease)    History of DVT of lower extremity    History  of kidney stones    Hx of blood clots 2010   Macular atrophy, retinal    bilateral   Torn rotator cuff    right shoulder    Past Surgical History:  Procedure Laterality Date   BACK SURGERY  1997   BACK SURGERY  04/2009   3 separate surgies for infection    COLONOSCOPY  07/21/2011   Procedure: COLONOSCOPY;  Surgeon: [name], MD;  [facility];  Service: Endoscopy;  Laterality: N/A;  12:45 PM   KNEE ARTHROPLASTY Right 02/19/2021   Procedure: COMPUTER ASSISTED TOTAL KNEE ARTHROPLASTY;  Surgeon: [name], MD;  Location: [facility];  Service: Orthopedics;  Laterality: Right;   RIGHT/LEFT HEART CATH AND CORONARY ANGIOGRAPHY N/A 04/16/2021   Procedure: RIGHT/LEFT HEART CATH AND CORONARY ANGIOGRAPHY;  Surgeon: [name], MD;  [facility]: [facility];  Service: Cardiovascular;  Laterality: N/A;   TONSILLECTOMY     TOTAL HIP ARTHROPLASTY Left 11/10/2015   TOTAL HIP ARTHROPLASTY Left 11/10/2015   Procedure: TOTAL LEFT HIP ARTHROPLASTY ANTERIOR APPROACH;  Surgeon: [name], MD;  Location: [facility] OR;  Service: Orthopedics;  Laterality: Left;    Current Medications: No outpatient medications have been marked as taking for the 03/08/22 encounter (Appointment) with [name], [name], NP.     Allergies:   Penicillins  Social History   Socioeconomic History   Marital status: Divorced    Spouse name: Not on file   Number of children: 3   Years of education: Not on file   Highest education level: Some college, no degree  Occupational History   Occupation: semi-retired    Comment: produce farmer  Tobacco Use   Smoking status: Never   Smokeless tobacco: Never  Vaping Use   Vaping Use: Never used  Substance and Sexual Activity   Alcohol use: No   Drug use: Never   Sexual activity: Not on file  Other Topics Concern   Not on file  Social History Narrative   Not on file   Social Determinants of Health   Financial Resource Strain: Low Risk  (04/13/2021)    Overall Financial Resource Strain (CARDIA)    Difficulty of Paying Living Expenses: Not hard at all  Food Insecurity: No Food Insecurity (04/13/2021)   Hunger Vital Sign    Worried About Running Out of Food in the Last Year: Never true    Ran Out of Food in the Last Year: Never true  Transportation Needs: No Transportation Needs (04/13/2021)   PRAPARE - Administrator, Civil Service (Medical): No    Lack of Transportation (Non-Medical): No  Physical Activity: Not on file  Stress: Not on file  Social Connections: Not on file     Family History: The patient's ***family history includes Hypertension in his father; Liver disease in his mother.  ROS:   Please see the history of present illness.    *** All other systems reviewed and are negative.  Labs/Other Studies Reviewed:    The following studies were reviewed today:  Echo 02/17/22 1. Left ventricular ejection fraction, by estimation, is 35 to 40%. The  left ventricle has moderately decreased function. The left ventricle  demonstrates global hypokinesis. The left ventricular internal cavity size  was mildly dilated. Left ventricular  diastolic parameters are consistent with Grade I diastolic dysfunction  (impaired relaxation).   2. Right ventricular systolic function is normal. The right ventricular  size is normal.   3. Left atrial size was mildly dilated.   4. The mitral valve is normal in structure. Trivial mitral valve  regurgitation. No evidence of mitral stenosis.   5. The aortic valve is tricuspid. There is mild calcification of the  aortic valve. Aortic valve regurgitation is not visualized. No aortic  stenosis is present.   6. Aortic dilatation noted. There is mild dilatation of the aortic root,  measuring 40 mm. There is borderline dilatation of the ascending aorta,  measuring 38 mm.   7. The inferior vena cava is normal in size with greater than 50%  respiratory variability, suggesting right atrial  pressure of 3 mmHg.   Cardiac Monitor 12/26/21 1. Sinus rhythm - avg HR of 83 bpm.  2. Rare PACs 3. Occasional PVCs (1.1%)  4. Ventricular Trigeminy was present.  5. No events in diary   [facility] 04/16/21   1st [name] lesion is 30% stenosed.   1st Diag lesion is 70% stenosed.   Prox LAD to Mid LAD lesion is 40% stenosed.   Prox RCA to Mid RCA lesion is 30% stenosed.   The left ventricular ejection fraction is less than 25% by visual estimate.   Findings: Ao = 85/60 (70) LV = 88/19 RA = 3 RV = 26/4 PA = 29/11 (20) PCW = 16 Fick cardiac output/index = 6.6/3.2 PVR = 0.6 WU SVR =  810 FA sat = 97% PA sat = 72%, 74% SVC sat = 73%   Assessment:   1. Moderate non-obstructive CAD 2. Severe NICM EF < 20% 3. Normal filling pressures and cardiac output   Cardiac MRI 04/16/21 IMPRESSION: 1.  Severe LV dilatation with severe systolic dysfunction (EF 22%)   2.  Normal RV size and systolic function (EF 48%)   3.  No late gadolinium enhancement to suggest myocardial scar   Recent Labs: 04/22/2021: Hemoglobin 13.1; Magnesium 2.3; Platelets 200; TSH 2.007 10/27/2021: B Natriuretic Peptide 48.1 02/17/2022: ALT 22; BUN 11; Creatinine, Ser 1.12; Potassium 3.8; Sodium 141  Recent Lipid Panel No results found for: "CHOL", "TRIG", "HDL", "CHOLHDL", "VLDL", "LDLCALC", "LDLDIRECT"   Risk Assessment/Calculations:   {Does this patient have ATRIAL FIBRILLATION?:[phone]}       Physical Exam:    VS:  There were no vitals taken for this visit.    Wt Readings from Last 3 Encounters:  02/17/22 195 lb 6.4 oz (88.6 kg)  10/27/21 189 lb (85.7 kg)  09/03/21 203 lb 12.8 oz (92.4 kg)     GEN: *** Well nourished, well developed in no acute distress HEENT: Normal NECK: No JVD; No carotid bruits CARDIAC: ***RRR, no murmurs, rubs, gallops RESPIRATORY:  Clear to auscultation without rales, wheezing or rhonchi  ABDOMEN: Soft, non-tender, non-distended MUSCULOSKELETAL:  No edema; No deformity.  *** pedal pulses, ***bilaterally SKIN: Warm and dry NEUROLOGIC:  Alert and oriented x 3 PSYCHIATRIC:  Normal affect   EKG:  EKG is *** ordered today.  The ekg ordered today demonstrates ***  No BP recorded.  {Refresh Note OR Click here to enter BP  :1}***    Diagnoses:    No diagnosis found. Assessment and Plan:     1.    {Are you ordering a CV Procedure (e.g. stress test, cath, DCCV, TEE, etc)?   Press F2        :[id number]}   Disposition:  Medication Adjustments/Labs and Tests Ordered: Current medicines are reviewed at length with the patient today.  Concerns regarding medicines are outlined above.  No orders of the defined types were placed in this encounter.  No orders of the defined types were placed in this encounter.   There are no Patient Instructions on file for this visit.   Signed, Levi Aland, NP  03/07/2022 9:00 AM    [facility]

## 2022-03-08 ENCOUNTER — Telehealth: Payer: Self-pay | Admitting: *Deleted

## 2022-03-08 ENCOUNTER — Ambulatory Visit: Payer: No Typology Code available for payment source | Admitting: Nurse Practitioner

## 2022-03-08 NOTE — Telephone Encounter (Signed)
I received a call from [facility] surgery scheduler for Dr. Linna Caprice that she has not yet received the clearance request. I will fax over the clearance notes to Jamison Oka today at fax # [phone]. Left message for Lynnea Ferrier to please let me know if she has not received the clearance notes still.

## 2022-03-08 NOTE — Telephone Encounter (Signed)
S/w pt is aware has been cleared for upcoming surgery by Dr. Jones Broom. Does not need appt today.

## 2022-03-09 DIAGNOSIS — E261 Secondary hyperaldosteronism: Secondary | ICD-10-CM | POA: Diagnosis not present

## 2022-03-09 DIAGNOSIS — Z6826 Body mass index (BMI) 26.0-26.9, adult: Secondary | ICD-10-CM | POA: Diagnosis not present

## 2022-03-09 DIAGNOSIS — Z008 Encounter for other general examination: Secondary | ICD-10-CM | POA: Diagnosis not present

## 2022-03-09 DIAGNOSIS — I5020 Unspecified systolic (congestive) heart failure: Secondary | ICD-10-CM | POA: Diagnosis not present

## 2022-03-09 DIAGNOSIS — E663 Overweight: Secondary | ICD-10-CM | POA: Diagnosis not present

## 2022-03-31 DIAGNOSIS — H524 Presbyopia: Secondary | ICD-10-CM | POA: Diagnosis not present

## 2022-04-08 ENCOUNTER — Ambulatory Visit: Payer: Self-pay | Admitting: Student

## 2022-04-09 DIAGNOSIS — M25562 Pain in left knee: Secondary | ICD-10-CM | POA: Diagnosis not present

## 2022-04-12 ENCOUNTER — Other Ambulatory Visit (HOSPITAL_COMMUNITY): Payer: Self-pay | Admitting: Family Medicine

## 2022-04-14 ENCOUNTER — Ambulatory Visit: Payer: Self-pay | Admitting: Student

## 2022-04-14 NOTE — H&P (Signed)
TOTAL KNEE ADMISSION H&P  Patient is being admitted for left total knee arthroplasty.  Subjective:  Chief Complaint:left knee pain.  HPI: Gary Ferguson, 67 y.o. male, has a history of pain and functional disability in the left knee due to arthritis and has failed non-surgical conservative treatments for greater than 12 weeks to includeNSAID's and/or analgesics, corticosteriod injections, flexibility and strengthening excercises, and activity modification.  Onset of symptoms was gradual, starting 10 years ago with rapidlly worsening course since that time. The patient noted no past surgery on the left knee(s).  Patient currently rates pain in the left knee(s) at 10 out of 10 with activity. Patient has night pain, worsening of pain with activity and weight bearing, pain that interferes with activities of daily living, pain with passive range of motion, crepitus, and joint swelling.  Patient has evidence of subchondral cysts, subchondral sclerosis, periarticular osteophytes, and joint space narrowing by imaging studies. There is no active infection.  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   Wound infection complicating hardware, subsequent encounter 04/12/2021   Pleural effusion, bilateral 04/11/2021   Shortness of breath 04/11/2021   Degenerative joint disease of right knee 02/19/2021   Osteoarthritis of right knee 02/19/2021   Primary osteoarthritis of left hip 11/10/2015   Past Medical History:  Diagnosis Date   Arthritis    CHF (congestive heart failure) (H[facility])    Chronic back pain    Complication of anesthesia    Patient said that after surgery his throat closed up in 2011   GERD (gastroesophageal reflux disease)    History of DVT of lower extremity    History of kidney stones    Hx of blood clots 2010   Macular atrophy, retinal    bilateral   PONV (postoperative nausea and vomiting)     Torn rotator cuff    right shoulder    Past Surgical History:  Procedure Laterality Date   [place]  04/2009   3 separate surgies for infection    COLONOSCOPY  07/21/2011   Procedure: COLONOSCOPY;  Surgeon: Daneil Dolin, MD;  [facility];  Service: Endoscopy;  Laterality: N/A;  12:45 PM   KNEE ARTHROPLASTY Right 02/19/2021   Procedure: COMPUTER ASSISTED TOTAL KNEE ARTHROPLASTY;  Surgeon: Rod Can, MD;  Location: [facility];  Service: Orthopedics;  Laterality: Right;   right shoulder surgery      RIGHT/LEFT HEART CATH AND CORONARY ANGIOGRAPHY N/A 04/16/2021   Procedure: RIGHT/LEFT HEART CATH AND CORONARY ANGIOGRAPHY;  Surgeon: Jolaine Artist, MD;  Location: [place] [facility];  Service: Cardiovascular;  Laterality: N/A;   TONSILLECTOMY     TOTAL HIP ARTHROPLASTY Left 11/10/2015   TOTAL HIP ARTHROPLASTY Left 11/10/2015   Procedure: TOTAL LEFT HIP ARTHROPLASTY ANTERIOR APPROACH;  Surgeon: Rod Can, MD;  Location: [place];  Service: Orthopedics;  Laterality: Left;    Current Outpatient Medications  Medication Sig Dispense Refill Last Dose   acetaminophen (TYLENOL) 500 MG tablet Take 1,000 mg by mouth every 6 (six) hours as needed for moderate pain.      aspirin EC 81 MG tablet Take 81 mg by mouth daily. Swallow whole.      carvedilol (COREG) 3.125 MG tablet TAKE ONE TABLET (3.125MG  TOTAL) BY MOUTHTWO TIMES DAILY WITH A MEAL 60 tablet 6    ciprofloxacin (CIPRO) 500 MG tablet Take 500  mg by mouth 2 (two) times daily.      clotrimazole-betamethasone (LOTRISONE) cream Apply 1 application topically 2 (two) times daily.      diphenhydramine-acetaminophen (TYLENOL PM) 25-500 MG TABS tablet Take 2 tablets by mouth at bedtime.      empagliflozin (JARDIANCE) 10 MG TABS tablet Take 1 tablet (10 mg total) by mouth daily before breakfast. 90 tablet 3    fexofenadine (ALLEGRA) 60 MG tablet Take 60 mg by mouth daily as needed for allergies.      losartan  (COZAAR) 25 MG tablet TAKE ONE-HALF TABLET (12.5MG  TOTAL) BY MOUTH AT BEDTIME 45 tablet 3    mexiletine (MEXITIL) 150 MG capsule Take 300 mg by mouth 2 (two) times daily.      Multiple Vitamin (MULTIVITAMIN) capsule Take 1 capsule by mouth daily.      Omega 3 1000 MG CAPS Take 1,000 mg by mouth daily.      omeprazole (PRILOSEC) 20 MG capsule Take 20 mg by mouth every morning.       spironolactone (ALDACTONE) 25 MG tablet TAKE ONE-HALF TABLET (12.5MG  TOTAL) BY MOUTH AT BEDTIME 45 tablet 3    No current facility-administered medications for this visit.   Allergies  Allergen Reactions   Penicillins Other (See Comments)    REACTION UNSPECIFIED  Has patient had a PCN reaction causing immediate rash, facial/tongue/throat swelling, SOB or lightheadedness with hypotension: unknown childhood allergy Has patient had a PCN reaction causing severe rash involving mucus membranes or skin necrosis: unsure Has patient had a PCN reaction that required hospitalization unsure Has patient had a PCN reaction occurring within the last 10 years: No If all of the above answers are "NO", then may proceed with Cephalosporin use.     Social History   Tobacco Use   Smoking status: Never   Smokeless tobacco: Never  Substance Use Topics   Alcohol use: No    Family History  Problem Relation Age of Onset   Liver disease Mother    Hypertension Father      Review of Systems  Musculoskeletal:  Positive for arthralgias, back pain, gait problem and joint swelling.  All other systems reviewed and are negative.   Objective:  Physical Exam Constitutional:      Appearance: Normal appearance.  HENT:     Head: Normocephalic and atraumatic.     Nose: Nose normal.     Mouth/Throat:     Mouth: Mucous membranes are moist.     Pharynx: Oropharynx is clear.  Eyes:     Conjunctiva/sclera: Conjunctivae normal.  Cardiovascular:     Rate and Rhythm: Normal rate and regular rhythm.     Pulses: Normal pulses.      Heart sounds: Normal heart sounds.  Pulmonary:     Effort: Pulmonary effort is normal.     Breath sounds: Normal breath sounds.  Abdominal:     General: Abdomen is flat.     Palpations: Abdomen is soft.  Genitourinary:    Comments: deferred Musculoskeletal:     Cervical back: Normal range of motion and neck supple.     Comments: Examination of the left knee reveals no skin wounds or lesions. He has some swelling. No warmth, erythema, or effusion. Mild valgus deformity. Tenderness to palpation medial joint line, lateral joint line, peripatellar retinacular tissues with a positive grind side. Range of motion 0 to 115 degrees without any ligamentous instability. Painless range of motion of the hip.  Calves soft and nontender. He is neurovascularly intact  distally  Skin:    General: Skin is warm and dry.     Capillary Refill: Capillary refill takes less than 2 seconds.  Neurological:     General: No focal deficit present.     Mental Status: He is alert and oriented to person, place, and time.  Psychiatric:        Mood and Affect: Mood normal.        Behavior: Behavior normal.        Thought Content: Thought content normal.        Judgment: Judgment normal.     Vital signs in last 24 hours: @VSRANGES @  Labs:   Estimated body mass index is 24.33 kg/m as calculated from the following:   Height as of 04/21/22: 5' 10.5" (1.791 m).   Weight as of 04/21/22: 78 kg.   Imaging Review Plain radiographs demonstrate severe degenerative joint disease of the left knee(s). The overall alignment ismild valgus. The bone quality appears to be adequate for age and reported activity level.      Assessment/Plan:  End stage arthritis, left knee   The patient history, physical examination, clinical judgment of the provider and imaging studies are consistent with end stage degenerative joint disease of the left knee(s) and total knee arthroplasty is deemed medically necessary. The treatment  options including medical management, injection therapy arthroscopy and arthroplasty were discussed at length. The risks and benefits of total knee arthroplasty were presented and reviewed. The risks due to aseptic loosening, infection, stiffness, patella tracking problems, thromboembolic complications and other imponderables were discussed. The patient acknowledged the explanation, agreed to proceed with the plan and consent was signed. Patient is being admitted for inpatient treatment for surgery, pain control, PT, OT, prophylactic antibiotics, VTE prophylaxis, progressive ambulation and ADL's and discharge planning. The patient is planning to be discharged home with OPPT after an overnight stay.   Therapy Plans: outpatient therapy. [facility] [place]. 1st PT appointment 05/03/22.  Disposition: Home with Grandson Planned DVT Prophylaxis: Eliquis 2.5 mg BID DME needed: None has rolling walker and ice machine.  PCP: Cleared. Cardiology: Cleared. Hold aspirin 5-7 days prior to surgery.  TXA: IV Allergies:  - Penicillin - childhood. Reaction unknown.  Anesthesia Concerns: None.  BMI: 28.1 Last HgbA1c: Not diabetic.  Other: - ASA 81mg  daily - History of RLE DVT. Has IVC filter.  - CAD, HFrEF 20-25%.  - Chronic low back pain.  - History of surgical infection.  - Oxycodone, zofran, methocarbamol.  - NO NSAIDs.  - 04/21/22: Cr. 1.14, Hgb 15.2    Patient's anticipated LOS is less than 2 midnights, meeting these requirements: - Younger than 48 - Lives within 1 hour of care - Has a competent adult at home to recover with post-op recover - NO history of  - Chronic pain requiring opiods  - Diabetes  - Coronary Artery Disease  - Heart failure  - Heart attack  - Stroke  - DVT/VTE  - Cardiac arrhythmia  - Respiratory Failure/COPD  - Renal failure  - Anemia  - Advanced Liver disease

## 2022-04-14 NOTE — H&P (View-Only) (Signed)
TOTAL KNEE ADMISSION H&P  Patient is being admitted for left total knee arthroplasty.  Subjective:  Chief Complaint:left knee pain.  HPI: Gary Ferguson, 67 y.o. male, has a history of pain and functional disability in the left knee due to arthritis and has failed non-surgical conservative treatments for greater than 12 weeks to includeNSAID's and/or analgesics, corticosteriod injections, flexibility and strengthening excercises, and activity modification.  Onset of symptoms was gradual, starting 10 years ago with rapidlly worsening course since that time. The patient noted no past surgery on the left knee(s).  Patient currently rates pain in the left knee(s) at 10 out of 10 with activity. Patient has night pain, worsening of pain with activity and weight bearing, pain that interferes with activities of daily living, pain with passive range of motion, crepitus, and joint swelling.  Patient has evidence of subchondral cysts, subchondral sclerosis, periarticular osteophytes, and joint space narrowing by imaging studies. There is no active infection.  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   Wound infection complicating hardware, subsequent encounter 04/12/2021   Pleural effusion, bilateral 04/11/2021   Shortness of breath 04/11/2021   Degenerative joint disease of right knee 02/19/2021   Osteoarthritis of right knee 02/19/2021   Primary osteoarthritis of left hip 11/10/2015   Past Medical History:  Diagnosis Date   Arthritis    CHF (congestive heart failure) (HCC)    Chronic back pain    Complication of anesthesia    Patient said that after surgery his throat closed up in 2011   GERD (gastroesophageal reflux disease)    History of DVT of lower extremity    History of kidney stones    Hx of blood clots 2010   Macular atrophy, retinal    bilateral   PONV (postoperative nausea and vomiting)     Torn rotator cuff    right shoulder    Past Surgical History:  Procedure Laterality Date   [place]  04/2009   3 separate surgies for infection    COLONOSCOPY  07/21/2011   Procedure: COLONOSCOPY;  Surgeon: Daneil Dolin, MD;  [facility];  Service: Endoscopy;  Laterality: N/A;  12:45 PM   KNEE ARTHROPLASTY Right 02/19/2021   Procedure: COMPUTER ASSISTED TOTAL KNEE ARTHROPLASTY;  Surgeon: Rod Can, MD;  Location: [facility];  Service: Orthopedics;  Laterality: Right;   right shoulder surgery      RIGHT/LEFT HEART CATH AND CORONARY ANGIOGRAPHY N/A 04/16/2021   Procedure: RIGHT/LEFT HEART CATH AND CORONARY ANGIOGRAPHY;  Surgeon: Jolaine Artist, MD;  Location: [facility];  Service: Cardiovascular;  Laterality: N/A;   TONSILLECTOMY     TOTAL HIP ARTHROPLASTY Left 11/10/2015   TOTAL HIP ARTHROPLASTY Left 11/10/2015   Procedure: TOTAL LEFT HIP ARTHROPLASTY ANTERIOR APPROACH;  Surgeon: Rod Can, MD;  Location: [facility];  Service: Orthopedics;  Laterality: Left;    Current Outpatient Medications  Medication Sig Dispense Refill Last Dose   acetaminophen (TYLENOL) 500 MG tablet Take 1,000 mg by mouth every 6 (six) hours as needed for moderate pain.      aspirin EC 81 MG tablet Take 81 mg by mouth daily. Swallow whole.      carvedilol (COREG) 3.125 MG tablet TAKE ONE TABLET (3.125MG  TOTAL) BY MOUTHTWO TIMES DAILY WITH A MEAL 60 tablet 6    ciprofloxacin (CIPRO) 500 MG tablet Take 500  mg by mouth 2 (two) times daily.      clotrimazole-betamethasone (LOTRISONE) cream Apply 1 application topically 2 (two) times daily.      diphenhydramine-acetaminophen (TYLENOL PM) 25-500 MG TABS tablet Take 2 tablets by mouth at bedtime.      empagliflozin (JARDIANCE) 10 MG TABS tablet Take 1 tablet (10 mg total) by mouth daily before breakfast. 90 tablet 3    fexofenadine (ALLEGRA) 60 MG tablet Take 60 mg by mouth daily as needed for allergies.      losartan  (COZAAR) 25 MG tablet TAKE ONE-HALF TABLET (12.5MG TOTAL) BY MOUTH AT BEDTIME 45 tablet 3    mexiletine (MEXITIL) 150 MG capsule Take 300 mg by mouth 2 (two) times daily.      Multiple Vitamin (MULTIVITAMIN) capsule Take 1 capsule by mouth daily.      Omega 3 1000 MG CAPS Take 1,000 mg by mouth daily.      omeprazole (PRILOSEC) 20 MG capsule Take 20 mg by mouth every morning.       spironolactone (ALDACTONE) 25 MG tablet TAKE ONE-HALF TABLET (12.5MG TOTAL) BY MOUTH AT BEDTIME 45 tablet 3    No current facility-administered medications for this visit.   Allergies  Allergen Reactions   Penicillins Other (See Comments)    REACTION UNSPECIFIED  Has patient had a PCN reaction causing immediate rash, facial/tongue/throat swelling, SOB or lightheadedness with hypotension: unknown childhood allergy Has patient had a PCN reaction causing severe rash involving mucus membranes or skin necrosis: unsure Has patient had a PCN reaction that required hospitalization unsure Has patient had a PCN reaction occurring within the last 10 years: No If all of the above answers are "NO", then may proceed with Cephalosporin use.     Social History   Tobacco Use   Smoking status: Never   Smokeless tobacco: Never  Substance Use Topics   Alcohol use: No    Family History  Problem Relation Age of Onset   Liver disease Mother    Hypertension Father      Review of Systems  Musculoskeletal:  Positive for arthralgias, back pain, gait problem and joint swelling.  All other systems reviewed and are negative.   Objective:  Physical Exam Constitutional:      Appearance: Normal appearance.  HENT:     Head: Normocephalic and atraumatic.     Nose: Nose normal.     Mouth/Throat:     Mouth: Mucous membranes are moist.     Pharynx: Oropharynx is clear.  Eyes:     Conjunctiva/sclera: Conjunctivae normal.  Cardiovascular:     Rate and Rhythm: Normal rate and regular rhythm.     Pulses: Normal pulses.      Heart sounds: Normal heart sounds.  Pulmonary:     Effort: Pulmonary effort is normal.     Breath sounds: Normal breath sounds.  Abdominal:     General: Abdomen is flat.     Palpations: Abdomen is soft.  Genitourinary:    Comments: deferred Musculoskeletal:     Cervical back: Normal range of motion and neck supple.     Comments: Examination of the left knee reveals no skin wounds or lesions. He has some swelling. No warmth, erythema, or effusion. Mild valgus deformity. Tenderness to palpation medial joint line, lateral joint line, peripatellar retinacular tissues with a positive grind side. Range of motion 0 to 115 degrees without any ligamentous instability. Painless range of motion of the hip.  Calves soft and nontender. He is neurovascularly intact   distally  Skin:    General: Skin is warm and dry.     Capillary Refill: Capillary refill takes less than 2 seconds.  Neurological:     General: No focal deficit present.     Mental Status: He is alert and oriented to person, place, and time.  Psychiatric:        Mood and Affect: Mood normal.        Behavior: Behavior normal.        Thought Content: Thought content normal.        Judgment: Judgment normal.     Vital signs in last 24 hours: @VSRANGES @  Labs:   Estimated body mass index is 24.33 kg/m as calculated from the following:   Height as of 04/21/22: 5' 10.5" (1.791 m).   Weight as of 04/21/22: 78 kg.   Imaging Review Plain radiographs demonstrate severe degenerative joint disease of the left knee(s). The overall alignment ismild valgus. The bone quality appears to be adequate for age and reported activity level.      Assessment/Plan:  End stage arthritis, left knee   The patient history, physical examination, clinical judgment of the provider and imaging studies are consistent with end stage degenerative joint disease of the left knee(s) and total knee arthroplasty is deemed medically necessary. The treatment  options including medical management, injection therapy arthroscopy and arthroplasty were discussed at length. The risks and benefits of total knee arthroplasty were presented and reviewed. The risks due to aseptic loosening, infection, stiffness, patella tracking problems, thromboembolic complications and other imponderables were discussed. The patient acknowledged the explanation, agreed to proceed with the plan and consent was signed. Patient is being admitted for inpatient treatment for surgery, pain control, PT, OT, prophylactic antibiotics, VTE prophylaxis, progressive ambulation and ADL's and discharge planning. The patient is planning to be discharged home with OPPT after an overnight stay.   Therapy Plans: outpatient therapy. EO [place]. 1st PT appointment 05/03/22.  Disposition: Home with Grandson Planned DVT Prophylaxis: Eliquis 2.5 mg BID DME needed: None has rolling walker and ice machine.  PCP: Cleared. Cardiology: Cleared. Hold aspirin 5-7 days prior to surgery.  TXA: IV Allergies:  - Penicillin - childhood. Reaction unknown.  Anesthesia Concerns: None.  BMI: 28.1 Last HgbA1c: Not diabetic.  Other: - ASA 81mg  daily - History of RLE DVT. Has IVC filter.  - CAD, HFrEF 20-25%.  - Chronic low back pain.  - History of surgical infection.  - Oxycodone, zofran, methocarbamol.  - NO NSAIDs.  - 04/21/22: Cr. 1.14, Hgb 15.2    Patient's anticipated LOS is less than 2 midnights, meeting these requirements: - Younger than 48 - Lives within 1 hour of care - Has a competent adult at home to recover with post-op recover - NO history of  - Chronic pain requiring opiods  - Diabetes  - Coronary Artery Disease  - Heart failure  - Heart attack  - Stroke  - DVT/VTE  - Cardiac arrhythmia  - Respiratory Failure/COPD  - Renal failure  - Anemia  - Advanced Liver disease

## 2022-04-19 NOTE — Progress Notes (Addendum)
Anesthesia Review:  PCP: [facility]  Cardiologist :  Johnsie Cancel- cardiologist, [place]- HF cardiologist Floyde Parkins, FNP LOV 02/17/22  Chest x-ray : 04/22/21- 2 view  EKG : 02/17/22  Echo : 02/17/22  Monitor- 12/26/21  Card MRi- 04/16/21  Stress test: Cardiac Cath :  04/16/21  Activity level: can do a flight of stairs without difficutly  Sleep Study/ CPAP : none  Fasting Blood Sugar :      / Checks Blood Sugar -- times a day:   Blood Thinner/ Instructions /Last Dose: ASA / Instructions/ Last Dose :    81 mg Aspirin    Jardiance is used for heart failure per pt .  PT is not diabetic.   Legally blind per pt. PT aware not to take am of surgery per Lyndon Code, [facility]

## 2022-04-19 NOTE — Patient Instructions (Signed)
SURGICAL WAITING ROOM VISITATION  Patients having surgery or a procedure may have no more than 2 support people in the waiting area - these visitors may rotate.    Children under the age of 84 must have an adult with them who is not the patient.  Due to an increase in RSV and influenza rates and associated hospitalizations, children ages 10 and under may not visit patients in [place].  If the patient needs to stay at the hospital during part of their recovery, the visitor guidelines for inpatient rooms apply. Pre-op nurse will coordinate an appropriate time for 1 support person to accompany patient in pre-op.  This support person may not rotate.    Please refer to the [facility] website for the visitor guidelines for Inpatients (after your surgery is over and you are in a regular room).       Your procedure is scheduled on:  04/28/22    Report to [facility] Main Entrance    Report to admitting at  1115 AM   Call this number if you have problems the morning of surgery [phone]   Do not eat food :After Midnight.   After Midnight you may have the following liquids until __ 1045____ AM  DAY OF SURGERY  Water Non-Citrus Juices (without pulp, NO RED-Apple, White grape, White cranberry) Black Coffee (NO MILK/CREAM OR CREAMERS, sugar ok)  Clear Tea (NO MILK/CREAM OR CREAMERS, sugar ok) regular and decaf                             Plain Jell-O (NO RED)                                           Fruit ices (not with fruit pulp, NO RED)                                     Popsicles (NO RED)                                                               Sports drinks like Gatorade (NO RED)                     The day of surgery:  Drink ONE (1) Pre-Surgery Clear Ensure or G2 at   1045AM  ( have compled by ) the morning of surgery. Drink in one sitting. Do not sip.  This drink was given to you during your hospital  pre-op appointment visit. Nothing else to drink  after completing the  Pre-Surgery Clear Ensure or G2.          If you have questions, please contact your surgeon's office.        Oral Hygiene is also important to reduce your risk of infection.                                    Remember - BRUSH YOUR TEETH THE MORNING  OF SURGERY WITH YOUR REGULAR TOOTHPASTE  DENTURES WILL BE REMOVED PRIOR TO SURGERY PLEASE DO NOT APPLY "Poly grip" OR ADHESIVES!!!   Do NOT smoke after Midnight   Take these medicines the morning of surgery with A SIP OF WATER:  coreg, allegra if needed, mexilitene, omeprazole    Hold jardiance for 72 hours prior to procedure.    DO NOT TAKE ANY ORAL DIABETIC MEDICATIONS DAY OF YOUR SURGERY  Bring CPAP mask and tubing day of surgery.                              You may not have any metal on your body including hair pins, jewelry, and body piercing             Do not wear make-up, lotions, powders, perfumes/cologne, or deodorant  Do not wear nail polish including gel and S&S, artificial/acrylic nails, or any other type of covering on natural nails including finger and toenails. If you have artificial nails, gel coating, etc. that needs to be removed by a nail salon please have this removed prior to surgery or surgery may need to be canceled/ delayed if the surgeon/ anesthesia feels like they are unable to be safely monitored.   Do not shave  48 hours prior to surgery.               Men may shave face and neck.   Do not bring valuables to the hospital. [name].   Contacts, glasses, dentures or bridgework may not be worn into surgery.   Bring small overnight bag day of surgery.   DO NOT [place]. PHARMACY WILL DISPENSE MEDICATIONS LISTED ON YOUR MEDICATION LIST TO YOU DURING YOUR ADMISSION Gary Ferguson!    Patients discharged on the day of surgery will not be allowed to drive home.  Someone NEEDS to stay with you for the  first 24 hours after anesthesia.   Special Instructions: Bring a copy of your healthcare power of attorney and living will documents the day of surgery if you haven't scanned them before.              Please read over the following fact sheets you were given: IF [name] [phone]   If you received a COVID test during your pre-op visit  it is requested that you wear a mask when out in public, stay away from anyone that may not be feeling well and notify your surgeon if you develop symptoms. If you test positive for Covid or have been in contact with anyone that has tested positive in the last 10 days please notify you surgeon.    [name] - Preparing for Surgery Before surgery, you can play an important role.  Because skin is not sterile, your skin needs to be as free of germs as possible.  You can reduce the number of germs on your skin by washing with CHG (chlorahexidine gluconate) soap before surgery.  CHG is an antiseptic cleaner which kills germs and bonds with the skin to continue killing germs even after washing. Please DO NOT use if you have an allergy to CHG or antibacterial soaps.  If your skin becomes reddened/irritated stop using the CHG and inform your nurse when you arrive at  Short Stay. Do not shave (including legs and underarms) for at least 48 hours prior to the first CHG shower.  You may shave your face/neck. Please follow these instructions carefully:  1.  Shower with CHG Soap the night before surgery and the  morning of Surgery.  2.  If you choose to wash your hair, wash your hair first as usual with your  normal  shampoo.  3.  After you shampoo, rinse your hair and body thoroughly to remove the  shampoo.                           4.  Use CHG as you would any other liquid soap.  You can apply chg directly  to the skin and wash                       Gently with a scrungie or clean washcloth.  5.  Apply the CHG Soap to  your body ONLY FROM THE NECK DOWN.   Do not use on face/ open                           Wound or open sores. Avoid contact with eyes, ears mouth and genitals (private parts).                       Wash face,  Genitals (private parts) with your normal soap.             6.  Wash thoroughly, paying special attention to the area where your surgery  will be performed.  7.  Thoroughly rinse your body with warm water from the neck down.  8.  DO NOT shower/wash with your normal soap after using and rinsing off  the CHG Soap.                9.  Pat yourself dry with a clean towel.            10.  Wear clean pajamas.            11.  Place clean sheets on your bed the night of your first shower and do not  sleep with pets. Day of Surgery : Do not apply any lotions/deodorants the morning of surgery.  Please wear clean clothes to the hospital/surgery center.  FAILURE TO FOLLOW THESE INSTRUCTIONS MAY RESULT IN THE CANCELLATION OF YOUR SURGERY PATIENT SIGNATURE_________________________________  NURSE SIGNATURE__________________________________  ________________________________________________________________________

## 2022-04-21 ENCOUNTER — Encounter (HOSPITAL_COMMUNITY)
Admission: RE | Admit: 2022-04-21 | Discharge: 2022-04-21 | Disposition: A | Discharge status: Home / Self Care | Payer: No Typology Code available for payment source | Source: Ambulatory Visit | Attending: Orthopedic Surgery | Admitting: Orthopedic Surgery

## 2022-04-21 ENCOUNTER — Encounter (HOSPITAL_COMMUNITY): Payer: Self-pay

## 2022-04-21 ENCOUNTER — Other Ambulatory Visit: Payer: Self-pay

## 2022-04-21 VITALS — BP 123/87 | HR 75 | Temp 97.6°F | Resp 16 | Ht 70.5 in | Wt 172.0 lb

## 2022-04-21 DIAGNOSIS — Z01812 Encounter for preprocedural laboratory examination: Secondary | ICD-10-CM | POA: Insufficient documentation

## 2022-04-21 DIAGNOSIS — I428 Other cardiomyopathies: Secondary | ICD-10-CM | POA: Diagnosis not present

## 2022-04-21 DIAGNOSIS — I2510 Atherosclerotic heart disease of native coronary artery without angina pectoris: Secondary | ICD-10-CM | POA: Diagnosis not present

## 2022-04-21 DIAGNOSIS — M1712 Unilateral primary osteoarthritis, left knee: Secondary | ICD-10-CM | POA: Diagnosis not present

## 2022-04-21 DIAGNOSIS — Z01818 Encounter for other preprocedural examination: Secondary | ICD-10-CM

## 2022-04-21 DIAGNOSIS — I82409 Acute embolism and thrombosis of unspecified deep veins of unspecified lower extremity: Secondary | ICD-10-CM | POA: Insufficient documentation

## 2022-04-21 HISTORY — DX: Nausea with vomiting, unspecified: R11.2

## 2022-04-21 HISTORY — DX: Other specified postprocedural states: Z98.890

## 2022-04-21 HISTORY — DX: Heart failure, unspecified: I50.9

## 2022-04-21 LAB — CBC
HCT: 46.8 % (ref 39.0–52.0)
Hemoglobin: 15.2 g/dL (ref 13.0–17.0)
MCH: 29.1 pg (ref 26.0–34.0)
MCHC: 32.5 g/dL (ref 30.0–36.0)
MCV: 89.5 fL (ref 80.0–100.0)
Platelets: 182 10*3/uL (ref 150–400)
RBC: 5.23 MIL/uL (ref 4.22–5.81)
RDW: 14.2 % (ref 11.5–15.5)
WBC: 4.2 10*3/uL (ref 4.0–10.5)
nRBC: 0 % (ref 0.0–0.2)

## 2022-04-21 LAB — BASIC METABOLIC PANEL
Anion gap: 6 (ref 5–15)
BUN: 17 mg/dL (ref 8–23)
CO2: 25 mmol/L (ref 22–32)
Calcium: 9 mg/dL (ref 8.9–10.3)
Chloride: 107 mmol/L (ref 98–111)
Creatinine, Ser: 1.14 mg/dL (ref 0.61–1.24)
GFR, Estimated: >60 mL/min (ref >60)
Glucose, Bld: 106 mg/dL — ABNORMAL HIGH (ref 70–99)
Potassium: 3.9 mmol/L (ref 3.5–5.1)
Sodium: 138 mmol/L (ref 135–145)

## 2022-04-21 LAB — SURGICAL PCR SCREEN
MRSA, PCR: NEGATIVE
Staphylococcus aureus: NEGATIVE

## 2022-04-22 NOTE — Progress Notes (Signed)
Anesthesia Chart Review   Case: [id number] Date/Time: 04/28/22 1336   Procedure: COMPUTER ASSISTED TOTAL KNEE ARTHROPLASTY (Left: Knee)   Anesthesia type: Spinal   Pre-op diagnosis: Left knee osteoarthritis   [facility]   Surgeons: Rod Can, MD       DISCUSSION:67 y.o. never smoker with h/o PONV, DVT s/p IVC filter, NICM HRrEF (EF 35-40% on Echo 02/17/2022), nonobstructive CAD on cath, left knee OA scheduled for above procedure 04/28/22 with Dr. Rod Can.   Per cardiology preoperative evaluation 03/03/2022, "Chart reviewed as part of pre-operative protocol coverage. Given past medical history and time since last visit, based on ACC/AHA guidelines, Gary Ferguson would be at acceptable risk for the planned procedure without further cardiovascular testing.    Patient was advised that if he develops new symptoms prior to surgery to contact our office to arrange a follow-up appointment.  He verbalized understanding.   Due to his low ejection fraction he is considered to be a moderate risk-high risk for perioperative cardiovascular complications.   His aspirin may be held for 5 to 7 days prior to his procedure.  Please resume as soon as hemostasis is achieved."  Anticipate pt can proceed with planned procedure barring acute status change.   VS: BP 123/87   Pulse 75   Temp 36.4 C (Oral)   Resp 16   Ht 5' 10.5" (1.791 m)   Wt 78 kg   SpO2 100%   BMI 24.33 kg/m   PROVIDERS: Redmond [employer], MD is PCP   Primary Cardiologist: Glori Bickers, MD  LABS: Labs reviewed: Acceptable for surgery. (all labs ordered are listed, but only abnormal results are displayed)  Labs Reviewed  BASIC METABOLIC PANEL - Abnormal; Notable for the following components:      Result Value   Glucose, Bld 106 (*)    All other components within normal limits  SURGICAL PCR SCREEN  CBC     IMAGES:   EKG:   CV: Echo 02/17/2022 1. Left ventricular ejection fraction, by  estimation, is 35 to 40%. The  left ventricle has moderately decreased function. The left ventricle  demonstrates global hypokinesis. The left ventricular internal cavity size  was mildly dilated. Left ventricular  diastolic parameters are consistent with Grade I diastolic dysfunction  (impaired relaxation).   2. Right ventricular systolic function is normal. The right ventricular  size is normal.   3. Left atrial size was mildly dilated.   4. The mitral valve is normal in structure. Trivial mitral valve  regurgitation. No evidence of mitral stenosis.   5. The aortic valve is tricuspid. There is mild calcification of the  aortic valve. Aortic valve regurgitation is not visualized. No aortic  stenosis is present.   6. Aortic dilatation noted. There is mild dilatation of the aortic root,  measuring 40 mm. There is borderline dilatation of the ascending aorta,  measuring 38 mm.   7. The inferior vena cava is normal in size with greater than 50%  respiratory variability, suggesting right atrial pressure of 3 mmHg.   Cardiac Cath 04/16/21 Assessment:   1. Moderate non-obstructive CAD 2. Severe NICM EF < 20% 3. Normal filling pressures and cardiac output   Past Medical History:  Diagnosis Date   Arthritis    CHF (congestive heart failure) (HCC)    Chronic back pain    Complication of anesthesia    Patient said that after surgery his throat closed up in 2011   GERD (gastroesophageal  reflux disease)    History of DVT of lower extremity    History of kidney stones    Hx of blood clots 2010   Macular atrophy, retinal    bilateral   PONV (postoperative nausea and vomiting)    Torn rotator cuff    right shoulder    Past Surgical History:  Procedure Laterality Date   [place]  04/2009   3 separate surgies for infection    COLONOSCOPY  07/21/2011   Procedure: COLONOSCOPY;  Surgeon: Daneil Dolin, MD;  [facility];  Service: Endoscopy;   Laterality: N/A;  12:45 PM   KNEE ARTHROPLASTY Right 02/19/2021   Procedure: COMPUTER ASSISTED TOTAL KNEE ARTHROPLASTY;  Surgeon: Rod Can, MD;  [facility];  Service: Orthopedics;  Laterality: Right;   right shoulder surgery      RIGHT/LEFT HEART CATH AND CORONARY ANGIOGRAPHY N/A 04/16/2021   Procedure: RIGHT/LEFT HEART CATH AND CORONARY ANGIOGRAPHY;  Surgeon: Jolaine Artist, MD;  Location: [place] [facility];  Service: Cardiovascular;  Laterality: N/A;   TONSILLECTOMY     TOTAL HIP ARTHROPLASTY Left 11/10/2015   TOTAL HIP ARTHROPLASTY Left 11/10/2015   Procedure: TOTAL LEFT HIP ARTHROPLASTY ANTERIOR APPROACH;  Surgeon: Rod Can, MD;  Location: [place];  Service: Orthopedics;  Laterality: Left;    MEDICATIONS:  acetaminophen (TYLENOL) 500 MG tablet   aspirin EC 81 MG tablet   carvedilol (COREG) 3.125 MG tablet   ciprofloxacin (CIPRO) 500 MG tablet   clotrimazole-betamethasone (LOTRISONE) cream   diphenhydramine-acetaminophen (TYLENOL PM) 25-500 MG TABS tablet   empagliflozin (JARDIANCE) 10 MG TABS tablet   fexofenadine (ALLEGRA) 60 MG tablet   losartan (COZAAR) 25 MG tablet   mexiletine (MEXITIL) 150 MG capsule   Multiple Vitamin (MULTIVITAMIN) capsule   Omega 3 1000 MG CAPS   omeprazole (PRILOSEC) 20 MG capsule   spironolactone (ALDACTONE) 25 MG tablet   No current facility-administered medications for this encounter.    Gary Felix Ward, PA-C [facility]Surgical [facility] [phone]

## 2022-04-22 NOTE — Anesthesia Preprocedure Evaluation (Signed)
Anesthesia Evaluation  Patient identified by MRN, date of birth, ID band Patient awake    Reviewed: Allergy & Precautions, H&P , NPO status , Patient's Chart, lab work & pertinent test results  History of Anesthesia Complications (+) PONV and history of anesthetic complications  Airway Mallampati: II  TM Distance: >3 FB Neck ROM: Full    Dental no notable dental hx.    Pulmonary neg pulmonary ROS   Pulmonary exam normal breath sounds clear to auscultation       Cardiovascular +CHF  Normal cardiovascular exam Rhythm:Regular Rate:Normal     Neuro/Psych negative neurological ROS  negative psych ROS   GI/Hepatic Neg liver ROS,GERD  ,,  Endo/Other  negative endocrine ROS    Renal/GU negative Renal ROS  negative genitourinary   Musculoskeletal  (+) Arthritis , Osteoarthritis,    Abdominal   Peds negative pediatric ROS (+)  Hematology negative hematology ROS (+)   Anesthesia Other Findings   Reproductive/Obstetrics negative OB ROS                             Anesthesia Physical Anesthesia Plan  ASA: 3  Anesthesia Plan: General   Post-op Pain Management: Regional block*, Dilaudid IV and Tylenol PO (pre-op)*   Induction: Intravenous  PONV Risk Score and Plan: 3 and Ondansetron, Dexamethasone, Midazolam and Treatment may vary due to age or medical condition  Airway Management Planned: Oral ETT  Additional Equipment:   Intra-op Plan:   Post-operative Plan: Extubation in OR  Informed Consent: I have reviewed the patients History and Physical, chart, labs and discussed the procedure including the risks, benefits and alternatives for the proposed anesthesia with the patient or authorized representative who has indicated his/her understanding and acceptance.     Dental advisory given  Plan Discussed with: CRNA  Anesthesia Plan Comments: (See PAT note 04/21/2022)         Anesthesia Quick Evaluation

## 2022-04-28 ENCOUNTER — Encounter (HOSPITAL_COMMUNITY): Payer: Self-pay | Admitting: Orthopedic Surgery

## 2022-04-28 ENCOUNTER — Other Ambulatory Visit: Payer: Self-pay

## 2022-04-28 ENCOUNTER — Ambulatory Visit (HOSPITAL_COMMUNITY): Payer: No Typology Code available for payment source | Admitting: Physician Assistant

## 2022-04-28 ENCOUNTER — Encounter (HOSPITAL_COMMUNITY)
Admission: RE | Disposition: A | Discharge status: Home / Self Care | Payer: Self-pay | Source: Home / Self Care | Attending: Orthopedic Surgery

## 2022-04-28 ENCOUNTER — Ambulatory Visit (HOSPITAL_COMMUNITY): Payer: No Typology Code available for payment source | Admitting: Anesthesiology

## 2022-04-28 ENCOUNTER — Ambulatory Visit (HOSPITAL_COMMUNITY): Payer: No Typology Code available for payment source

## 2022-04-28 ENCOUNTER — Ambulatory Visit (HOSPITAL_COMMUNITY)
Admission: RE | Admit: 2022-04-28 | Discharge: 2022-04-29 | Disposition: A | Discharge status: Home / Self Care | Payer: No Typology Code available for payment source | Attending: Orthopedic Surgery | Admitting: Orthopedic Surgery

## 2022-04-28 DIAGNOSIS — Z01818 Encounter for other preprocedural examination: Secondary | ICD-10-CM

## 2022-04-28 DIAGNOSIS — I509 Heart failure, unspecified: Secondary | ICD-10-CM | POA: Diagnosis not present

## 2022-04-28 DIAGNOSIS — Z471 Aftercare following joint replacement surgery: Secondary | ICD-10-CM | POA: Diagnosis not present

## 2022-04-28 DIAGNOSIS — M170 Bilateral primary osteoarthritis of knee: Secondary | ICD-10-CM | POA: Insufficient documentation

## 2022-04-28 DIAGNOSIS — M1712 Unilateral primary osteoarthritis, left knee: Secondary | ICD-10-CM

## 2022-04-28 DIAGNOSIS — Z96652 Presence of left artificial knee joint: Secondary | ICD-10-CM | POA: Diagnosis not present

## 2022-04-28 DIAGNOSIS — I5021 Acute systolic (congestive) heart failure: Secondary | ICD-10-CM | POA: Insufficient documentation

## 2022-04-28 DIAGNOSIS — Z7984 Long term (current) use of oral hypoglycemic drugs: Secondary | ICD-10-CM | POA: Diagnosis not present

## 2022-04-28 DIAGNOSIS — Z86718 Personal history of other venous thrombosis and embolism: Secondary | ICD-10-CM | POA: Diagnosis not present

## 2022-04-28 DIAGNOSIS — Z79899 Other long term (current) drug therapy: Secondary | ICD-10-CM | POA: Insufficient documentation

## 2022-04-28 DIAGNOSIS — M1990 Unspecified osteoarthritis, unspecified site: Secondary | ICD-10-CM | POA: Diagnosis not present

## 2022-04-28 DIAGNOSIS — Z96642 Presence of left artificial hip joint: Secondary | ICD-10-CM

## 2022-04-28 DIAGNOSIS — K219 Gastro-esophageal reflux disease without esophagitis: Secondary | ICD-10-CM | POA: Diagnosis not present

## 2022-04-28 DIAGNOSIS — G8918 Other acute postprocedural pain: Secondary | ICD-10-CM | POA: Diagnosis not present

## 2022-04-28 HISTORY — PX: KNEE ARTHROPLASTY: SHX992

## 2022-04-28 LAB — HEMOGLOBIN A1C
Hgb A1c MFr Bld: 5 % (ref 4.8–5.6)
Mean Plasma Glucose: 96.8 mg/dL

## 2022-04-28 LAB — GLUCOSE, CAPILLARY: Glucose-Capillary: 116 mg/dL — ABNORMAL HIGH (ref 70–99)

## 2022-04-28 SURGERY — ARTHROPLASTY, KNEE, TOTAL, USING IMAGELESS COMPUTER-ASSISTED NAVIGATION
Anesthesia: General | Site: Knee | Laterality: Left

## 2022-04-28 MED ORDER — SENNA 8.6 MG PO TABS
1.0000 | ORAL_TABLET | Freq: Two times a day (BID) | ORAL | Status: DC
  Administered 2022-04-28 – 2022-04-29 (×2): 8.6 mg via ORAL
  Filled 2022-04-28 (×2): qty 1

## 2022-04-28 MED ORDER — VANCOMYCIN HCL IN DEXTROSE 1-5 GM/200ML-% IV SOLN
1000.0000 mg | INTRAVENOUS | Status: AC
  Administered 2022-04-28: 1000 mg via INTRAVENOUS
  Filled 2022-04-28: qty 200

## 2022-04-28 MED ORDER — SODIUM CHLORIDE 0.9 % IV SOLN: INTRAVENOUS | Status: DC

## 2022-04-28 MED ORDER — OXYCODONE HCL 5 MG PO TABS
ORAL_TABLET | ORAL | Status: AC
  Filled 2022-04-28: qty 1

## 2022-04-28 MED ORDER — ROPIVACAINE HCL 5 MG/ML IJ SOLN
INTRAMUSCULAR | Status: DC | PRN
  Administered 2022-04-28: 20 mL via PERINEURAL

## 2022-04-28 MED ORDER — MEXILETINE HCL 150 MG PO CAPS
300.0000 mg | ORAL_CAPSULE | Freq: Two times a day (BID) | ORAL | Status: DC
  Administered 2022-04-28 – 2022-04-29 (×2): 300 mg via ORAL
  Filled 2022-04-28 (×2): qty 2

## 2022-04-28 MED ORDER — HYDROMORPHONE HCL 1 MG/ML IJ SOLN
0.2500 mg | INTRAMUSCULAR | Status: DC | PRN
  Administered 2022-04-28: 0.5 mg via INTRAVENOUS

## 2022-04-28 MED ORDER — OXYCODONE HCL 5 MG/5ML PO SOLN: 5.0000 mg | Freq: Once | ORAL | Status: AC | PRN

## 2022-04-28 MED ORDER — TRANEXAMIC ACID-NACL 1000-0.7 MG/100ML-% IV SOLN
1000.0000 mg | INTRAVENOUS | Status: AC
  Administered 2022-04-28: 1000 mg via INTRAVENOUS
  Filled 2022-04-28: qty 100

## 2022-04-28 MED ORDER — SODIUM CHLORIDE 0.9 % IR SOLN
Status: DC | PRN
  Administered 2022-04-28: 1000 mL

## 2022-04-28 MED ORDER — ONDANSETRON HCL 4 MG/2ML IJ SOLN
INTRAMUSCULAR | Status: DC | PRN
  Administered 2022-04-28: 4 mg via INTRAVENOUS

## 2022-04-28 MED ORDER — FENTANYL CITRATE PF 50 MCG/ML IJ SOSY
100.0000 ug | PREFILLED_SYRINGE | INTRAMUSCULAR | Status: DC
  Administered 2022-04-28: 100 ug via INTRAVENOUS
  Filled 2022-04-28: qty 2

## 2022-04-28 MED ORDER — MIDAZOLAM HCL 2 MG/2ML IJ SOLN
2.0000 mg | INTRAMUSCULAR | Status: DC
  Administered 2022-04-28: 2 mg via INTRAVENOUS
  Filled 2022-04-28: qty 2

## 2022-04-28 MED ORDER — CHLORHEXIDINE GLUCONATE 0.12 % MT SOLN
15.0000 mL | Freq: Once | OROMUCOSAL | Status: AC
  Administered 2022-04-28: 15 mL via OROMUCOSAL

## 2022-04-28 MED ORDER — DOCUSATE SODIUM 100 MG PO CAPS
100.0000 mg | ORAL_CAPSULE | Freq: Two times a day (BID) | ORAL | Status: DC
  Administered 2022-04-28 – 2022-04-29 (×2): 100 mg via ORAL
  Filled 2022-04-28 (×2): qty 1

## 2022-04-28 MED ORDER — ACETAMINOPHEN 500 MG PO TABS
1000.0000 mg | ORAL_TABLET | Freq: Once | ORAL | Status: AC
  Administered 2022-04-28: 1000 mg via ORAL
  Filled 2022-04-28: qty 2

## 2022-04-28 MED ORDER — OMEGA-3-ACID ETHYL ESTERS 1 G PO CAPS
1000.0000 mg | ORAL_CAPSULE | Freq: Every day | ORAL | Status: DC
  Administered 2022-04-29: 1000 mg via ORAL
  Filled 2022-04-28: qty 1

## 2022-04-28 MED ORDER — OXYCODONE HCL 5 MG PO TABS
10.0000 mg | ORAL_TABLET | ORAL | Status: DC | PRN
  Administered 2022-04-28 – 2022-04-29 (×2): 15 mg via ORAL
  Filled 2022-04-28 (×2): qty 3

## 2022-04-28 MED ORDER — BUPIVACAINE HCL (PF) 0.25 % IJ SOLN
INTRAMUSCULAR | Status: DC | PRN
  Administered 2022-04-28: 30 mL

## 2022-04-28 MED ORDER — CARVEDILOL 3.125 MG PO TABS
3.1250 mg | ORAL_TABLET | Freq: Two times a day (BID) | ORAL | Status: DC
  Administered 2022-04-28 – 2022-04-29 (×2): 3.125 mg via ORAL
  Filled 2022-04-28 (×2): qty 1

## 2022-04-28 MED ORDER — LORATADINE 10 MG PO TABS
10.0000 mg | ORAL_TABLET | Freq: Every day | ORAL | Status: DC
  Administered 2022-04-28: 10 mg via ORAL
  Filled 2022-04-28 (×2): qty 1

## 2022-04-28 MED ORDER — LACTATED RINGERS IV SOLN: INTRAVENOUS | Status: DC

## 2022-04-28 MED ORDER — KETOROLAC TROMETHAMINE 30 MG/ML IJ SOLN
INTRAMUSCULAR | Status: DC | PRN
  Administered 2022-04-28: 30 mg

## 2022-04-28 MED ORDER — DEXAMETHASONE SODIUM PHOSPHATE 10 MG/ML IJ SOLN
INTRAMUSCULAR | Status: DC | PRN
  Administered 2022-04-28: 8 mg via INTRAVENOUS

## 2022-04-28 MED ORDER — MEPERIDINE HCL 50 MG/ML IJ SOLN: 6.2500 mg | INTRAMUSCULAR | Status: DC | PRN

## 2022-04-28 MED ORDER — VANCOMYCIN HCL IN DEXTROSE 1-5 GM/200ML-% IV SOLN
1000.0000 mg | Freq: Two times a day (BID) | INTRAVENOUS | Status: AC
  Administered 2022-04-28: 1000 mg via INTRAVENOUS
  Filled 2022-04-28: qty 200

## 2022-04-28 MED ORDER — OXYCODONE HCL 5 MG PO TABS
5.0000 mg | ORAL_TABLET | ORAL | Status: DC | PRN
  Administered 2022-04-28 – 2022-04-29 (×2): 5 mg via ORAL
  Filled 2022-04-28 (×2): qty 1
  Filled 2022-04-28: qty 2

## 2022-04-28 MED ORDER — POVIDONE-IODINE 10 % EX SWAB: 2.0000 | Freq: Once | CUTANEOUS | Status: DC

## 2022-04-28 MED ORDER — METHOCARBAMOL 1000 MG/10ML IJ SOLN: 500.0000 mg | Freq: Four times a day (QID) | INTRAVENOUS | Status: DC | PRN

## 2022-04-28 MED ORDER — APIXABAN 2.5 MG PO TABS
2.5000 mg | ORAL_TABLET | Freq: Two times a day (BID) | ORAL | Status: DC
  Administered 2022-04-29: 2.5 mg via ORAL
  Filled 2022-04-28: qty 1

## 2022-04-28 MED ORDER — ORAL CARE MOUTH RINSE: 15.0000 mL | Freq: Once | OROMUCOSAL | Status: AC

## 2022-04-28 MED ORDER — PROPOFOL 10 MG/ML IV BOLUS
INTRAVENOUS | Status: DC | PRN
  Administered 2022-04-28: 50 ug/kg/min via INTRAVENOUS
  Administered 2022-04-28: 150 mg via INTRAVENOUS

## 2022-04-28 MED ORDER — HYDROMORPHONE HCL 1 MG/ML IJ SOLN
INTRAMUSCULAR | Status: AC
  Administered 2022-04-28: 0.5 mg via INTRAVENOUS
  Filled 2022-04-28: qty 1

## 2022-04-28 MED ORDER — CEFAZOLIN SODIUM-DEXTROSE 2-4 GM/100ML-% IV SOLN
2.0000 g | INTRAVENOUS | Status: AC
  Administered 2022-04-28: 2 g via INTRAVENOUS
  Filled 2022-04-28: qty 100

## 2022-04-28 MED ORDER — ROCURONIUM BROMIDE 10 MG/ML (PF) SYRINGE
PREFILLED_SYRINGE | INTRAVENOUS | Status: DC | PRN
  Administered 2022-04-28: 80 mg via INTRAVENOUS

## 2022-04-28 MED ORDER — METOCLOPRAMIDE HCL 5 MG PO TABS: 5.0000 mg | ORAL_TABLET | Freq: Three times a day (TID) | ORAL | Status: DC | PRN

## 2022-04-28 MED ORDER — METHOCARBAMOL 500 MG PO TABS
500.0000 mg | ORAL_TABLET | Freq: Four times a day (QID) | ORAL | Status: DC | PRN
  Administered 2022-04-28 – 2022-04-29 (×2): 500 mg via ORAL
  Filled 2022-04-28 (×2): qty 1

## 2022-04-28 MED ORDER — PROMETHAZINE HCL 25 MG/ML IJ SOLN: 6.2500 mg | INTRAMUSCULAR | Status: DC | PRN

## 2022-04-28 MED ORDER — HYDROMORPHONE HCL 2 MG/ML IJ SOLN
INTRAMUSCULAR | Status: AC
  Filled 2022-04-28: qty 1

## 2022-04-28 MED ORDER — PANTOPRAZOLE SODIUM 40 MG PO TBEC
40.0000 mg | DELAYED_RELEASE_TABLET | Freq: Every day | ORAL | Status: DC
  Administered 2022-04-29: 40 mg via ORAL
  Filled 2022-04-28: qty 1

## 2022-04-28 MED ORDER — SODIUM CHLORIDE (PF) 0.9 % IJ SOLN
INTRAMUSCULAR | Status: AC
  Filled 2022-04-28: qty 50

## 2022-04-28 MED ORDER — OXYCODONE HCL 5 MG PO TABS
5.0000 mg | ORAL_TABLET | Freq: Once | ORAL | Status: AC | PRN
  Administered 2022-04-28: 5 mg via ORAL

## 2022-04-28 MED ORDER — POLYETHYLENE GLYCOL 3350 17 G PO PACK: 17.0000 g | PACK | Freq: Every day | ORAL | Status: DC | PRN

## 2022-04-28 MED ORDER — PHENYLEPHRINE HCL-NACL 20-0.9 MG/250ML-% IV SOLN
INTRAVENOUS | Status: DC | PRN
  Administered 2022-04-28: 50 ug/min via INTRAVENOUS

## 2022-04-28 MED ORDER — ACETAMINOPHEN 500 MG PO TABS
1000.0000 mg | ORAL_TABLET | Freq: Four times a day (QID) | ORAL | Status: DC
  Administered 2022-04-28 – 2022-04-29 (×3): 1000 mg via ORAL
  Filled 2022-04-28 (×3): qty 2

## 2022-04-28 MED ORDER — ALUM & MAG HYDROXIDE-SIMETH 200-200-20 MG/5ML PO SUSP: 30.0000 mL | ORAL | Status: DC | PRN

## 2022-04-28 MED ORDER — ACETAMINOPHEN 325 MG PO TABS: 325.0000 mg | ORAL_TABLET | Freq: Four times a day (QID) | ORAL | Status: DC | PRN

## 2022-04-28 MED ORDER — SODIUM CHLORIDE (PF) 0.9 % IJ SOLN
INTRAMUSCULAR | Status: DC | PRN
  Administered 2022-04-28: 30 mL

## 2022-04-28 MED ORDER — SODIUM CHLORIDE (PF) 0.9 % IJ SOLN
INTRAMUSCULAR | Status: AC
  Filled 2022-04-28: qty 10

## 2022-04-28 MED ORDER — HYDROMORPHONE HCL 1 MG/ML IJ SOLN
INTRAMUSCULAR | Status: DC | PRN
  Administered 2022-04-28 (×2): .5 mg via INTRAVENOUS

## 2022-04-28 MED ORDER — HYDROMORPHONE HCL 1 MG/ML IJ SOLN
0.5000 mg | INTRAMUSCULAR | Status: DC | PRN
  Administered 2022-04-28: 1 mg via INTRAVENOUS
  Filled 2022-04-28: qty 1

## 2022-04-28 MED ORDER — DIPHENHYDRAMINE HCL 12.5 MG/5ML PO ELIX: 12.5000 mg | ORAL_SOLUTION | ORAL | Status: DC | PRN

## 2022-04-28 MED ORDER — KETOROLAC TROMETHAMINE 30 MG/ML IJ SOLN
INTRAMUSCULAR | Status: AC
  Filled 2022-04-28: qty 1

## 2022-04-28 MED ORDER — AMISULPRIDE (ANTIEMETIC) 5 MG/2ML IV SOLN: 10.0000 mg | Freq: Once | INTRAVENOUS | Status: DC | PRN

## 2022-04-28 MED ORDER — SODIUM CHLORIDE 0.9 % IR SOLN
Status: DC | PRN
  Administered 2022-04-28: 3000 mL

## 2022-04-28 MED ORDER — ADULT MULTIVITAMIN W/MINERALS CH
1.0000 | ORAL_TABLET | Freq: Every day | ORAL | Status: DC
  Administered 2022-04-29: 1 via ORAL
  Filled 2022-04-28: qty 1

## 2022-04-28 MED ORDER — STERILE WATER FOR IRRIGATION IR SOLN
Status: DC | PRN
  Administered 2022-04-28: 2000 mL

## 2022-04-28 MED ORDER — CIPROFLOXACIN HCL 500 MG PO TABS
500.0000 mg | ORAL_TABLET | Freq: Two times a day (BID) | ORAL | Status: DC
  Administered 2022-04-28 – 2022-04-29 (×2): 500 mg via ORAL
  Filled 2022-04-28 (×2): qty 1

## 2022-04-28 MED ORDER — CLOTRIMAZOLE 1 % EX CREA
TOPICAL_CREAM | Freq: Two times a day (BID) | CUTANEOUS | Status: DC
  Filled 2022-04-28: qty 15

## 2022-04-28 MED ORDER — PHENOL 1.4 % MT LIQD: 1.0000 | OROMUCOSAL | Status: DC | PRN

## 2022-04-28 MED ORDER — SUGAMMADEX SODIUM 200 MG/2ML IV SOLN
INTRAVENOUS | Status: DC | PRN
  Administered 2022-04-28: 200 mg via INTRAVENOUS

## 2022-04-28 MED ORDER — ONDANSETRON HCL 4 MG/2ML IJ SOLN: 4.0000 mg | Freq: Four times a day (QID) | INTRAMUSCULAR | Status: DC | PRN

## 2022-04-28 MED ORDER — LIDOCAINE 2% (20 MG/ML) 5 ML SYRINGE
INTRAMUSCULAR | Status: DC | PRN
  Administered 2022-04-28: 80 mg via INTRAVENOUS

## 2022-04-28 MED ORDER — BUPIVACAINE HCL (PF) 0.25 % IJ SOLN
INTRAMUSCULAR | Status: AC
  Filled 2022-04-28: qty 30

## 2022-04-28 MED ORDER — INSULIN ASPART 100 UNIT/ML IJ SOLN: 0.0000 [IU] | Freq: Every day | INTRAMUSCULAR | Status: DC

## 2022-04-28 MED ORDER — ONDANSETRON HCL 4 MG PO TABS: 4.0000 mg | ORAL_TABLET | Freq: Four times a day (QID) | ORAL | Status: DC | PRN

## 2022-04-28 MED ORDER — METOCLOPRAMIDE HCL 5 MG/ML IJ SOLN: 5.0000 mg | Freq: Three times a day (TID) | INTRAMUSCULAR | Status: DC | PRN

## 2022-04-28 MED ORDER — INSULIN ASPART 100 UNIT/ML IJ SOLN: 0.0000 [IU] | Freq: Three times a day (TID) | INTRAMUSCULAR | Status: DC

## 2022-04-28 MED ORDER — MENTHOL 3 MG MT LOZG: 1.0000 | LOZENGE | OROMUCOSAL | Status: DC | PRN

## 2022-04-28 SURGICAL SUPPLY — 68 items
ADH SKN CLS APL DERMABOND .7 (GAUZE/BANDAGES/DRESSINGS) ×2
APL PRP STRL LF DISP 70% ISPRP (MISCELLANEOUS) ×2
BAG COUNTER SPONGE SURGICOUNT (BAG) IMPLANT
BAG SPNG CNTER NS LX DISP (BAG) ×1
BAG ZIPLOCK 12X15 (MISCELLANEOUS) IMPLANT
BATTERY INSTRU NAVIGATION (MISCELLANEOUS) ×3 IMPLANT
BLADE SAW RECIPROCATING 77.5 (BLADE) ×1 IMPLANT
BNDG ELASTIC 4X5.8 VLCR STR LF (GAUZE/BANDAGES/DRESSINGS) ×1 IMPLANT
BNDG ELASTIC 6X5.8 VLCR STR LF (GAUZE/BANDAGES/DRESSINGS) ×1 IMPLANT
BTRY SRG DRVR LF (MISCELLANEOUS) ×3
CHLORAPREP W/TINT 26 (MISCELLANEOUS) ×2 IMPLANT
COMP FEM PS KNEE STD 10 LT (Knees) ×1 IMPLANT
COMP TIB PS G 0D LT (Joint) ×1 IMPLANT
COMPONENT FEM PS KN STD 10 LT (Knees) IMPLANT
COMPONET TIB PS G 0D LT (Joint) IMPLANT
COVER SURGICAL LIGHT HANDLE (MISCELLANEOUS) ×1 IMPLANT
DERMABOND ADVANCED .7 DNX12 (GAUZE/BANDAGES/DRESSINGS) ×2 IMPLANT
DRAPE SHEET LG 3/4 BI-LAMINATE (DRAPES) ×3 IMPLANT
DRAPE U-SHAPE 47X51 STRL (DRAPES) ×1 IMPLANT
DRSG AQUACEL AG ADV 3.5X10 (GAUZE/BANDAGES/DRESSINGS) ×1 IMPLANT
ELECT BLADE TIP CTD 4 INCH (ELECTRODE) ×1 IMPLANT
ELECT REM PT RETURN 15FT ADLT (MISCELLANEOUS) ×1 IMPLANT
GAUZE SPONGE 4X4 12PLY STRL (GAUZE/BANDAGES/DRESSINGS) ×1 IMPLANT
GLOVE BIO SURGEON STRL SZ7 (GLOVE) ×1 IMPLANT
GLOVE BIO SURGEON STRL SZ8.5 (GLOVE) ×2 IMPLANT
GLOVE BIOGEL PI IND STRL 7.5 (GLOVE) ×1 IMPLANT
GLOVE BIOGEL PI IND STRL 8.5 (GLOVE) ×1 IMPLANT
GOWN SPEC L3 XXLG W/TWL (GOWN DISPOSABLE) ×1 IMPLANT
GOWN STRL REUS W/ TWL XL LVL3 (GOWN DISPOSABLE) ×1 IMPLANT
GOWN STRL REUS W/TWL XL LVL3 (GOWN DISPOSABLE) ×1
HANDPIECE INTERPULSE COAX TIP (DISPOSABLE) ×1
HDLS TROCR DRIL PIN KNEE 75 (PIN) ×1
HOLDER FOLEY CATH W/STRAP (MISCELLANEOUS) ×1 IMPLANT
HOOD PEEL AWAY T7 (MISCELLANEOUS) ×3 IMPLANT
KIT TURNOVER KIT A (KITS) IMPLANT
LINER TIB ASF PS GH/7-12 10 LT (Liner) IMPLANT
MARKER SKIN DUAL TIP RULER LAB (MISCELLANEOUS) ×1 IMPLANT
NDL SAFETY ECLIP 18X1.5 (MISCELLANEOUS) ×1 IMPLANT
NDL SPNL 18GX3.5 QUINCKE PK (NEEDLE) ×1 IMPLANT
NEEDLE SPNL 18GX3.5 QUINCKE PK (NEEDLE) ×1 IMPLANT
NS IRRIG 1000ML POUR BTL (IV SOLUTION) ×1 IMPLANT
PACK TOTAL KNEE CUSTOM (KITS) ×1 IMPLANT
PADDING CAST COTTON 6X4 STRL (CAST SUPPLIES) ×1 IMPLANT
PATELLA STD SZ 38X10 (Miscellaneous) IMPLANT
PIN DRILL HDLS TROCAR 75 4PK (PIN) IMPLANT
PROTECTOR NERVE ULNAR (MISCELLANEOUS) ×1 IMPLANT
SAW OSC TIP CART 19.5X105X1.3 (SAW) ×1 IMPLANT
SCREW FEMALE HEX FIX 25X2.5 (ORTHOPEDIC DISPOSABLE SUPPLIES) IMPLANT
SEALER BIPOLAR AQUA 6.0 (INSTRUMENTS) ×1 IMPLANT
SET HNDPC FAN SPRY TIP SCT (DISPOSABLE) ×1 IMPLANT
SET PAD KNEE POSITIONER (MISCELLANEOUS) ×1 IMPLANT
SOLUTION PRONTOSAN WOUND 350ML (IRRIGATION / IRRIGATOR) IMPLANT
SPIKE FLUID TRANSFER (MISCELLANEOUS) ×2 IMPLANT
SUT MNCRL AB 3-0 PS2 18 (SUTURE) ×1 IMPLANT
SUT MNCRL AB 4-0 PS2 18 (SUTURE) IMPLANT
SUT MON AB 2-0 CT1 36 (SUTURE) ×1 IMPLANT
SUT STRATAFIX PDO 1 14 VIOLET (SUTURE) ×1
SUT STRATFX PDO 1 14 VIOLET (SUTURE) ×1
SUT VIC AB 1 CTX 36 (SUTURE) ×2
SUT VIC AB 1 CTX36XBRD ANBCTR (SUTURE) ×2 IMPLANT
SUT VIC AB 2-0 CT1 27 (SUTURE) ×1
SUT VIC AB 2-0 CT1 TAPERPNT 27 (SUTURE) ×1 IMPLANT
SUTURE STRATFX PDO 1 14 VIOLET (SUTURE) ×1 IMPLANT
SYR 3ML LL SCALE MARK (SYRINGE) ×1 IMPLANT
TRAY FOLEY MTR SLVR 16FR STAT (SET/KITS/TRAYS/PACK) IMPLANT
TUBE SUCTION HIGH CAP CLEAR NV (SUCTIONS) ×1 IMPLANT
WATER STERILE IRR 1000ML POUR (IV SOLUTION) ×2 IMPLANT
WRAP KNEE MAXI GEL POST OP (GAUZE/BANDAGES/DRESSINGS) IMPLANT

## 2022-04-28 NOTE — Anesthesia Procedure Notes (Signed)
Procedure Name: Intubation Date/Time: 04/28/2022 11:53 AM  Performed by: Lavina Hamman, CRNAPre-anesthesia Checklist: Patient identified, Emergency Drugs available, Suction available, Patient being monitored and Timeout performed Patient Re-evaluated:Patient Re-evaluated prior to induction Oxygen Delivery Method: Circle system utilized Preoxygenation: Pre-oxygenation with 100% oxygen Induction Type: IV induction Ventilation: Mask ventilation without difficulty Laryngoscope Size: Mac and 4 Grade View: Grade I Tube type: Oral Tube size: 7.5 mm Number of attempts: 1 Airway Equipment and Method: Stylet Placement Confirmation: ETT inserted through vocal cords under direct vision, positive ETCO2, CO2 detector and breath sounds checked- equal and bilateral Secured at: 23 cm Tube secured with: Tape Dental Injury: Teeth and Oropharynx as per pre-operative assessment  Comments: ATOI

## 2022-04-28 NOTE — Transfer of Care (Signed)
Immediate Anesthesia Transfer of Care Note  Patient: Gary Ferguson  Procedure(s) Performed: Procedure(s): COMPUTER ASSISTED TOTAL KNEE ARTHROPLASTY (Left)  Patient Location: PACU  Anesthesia Type:General  Level of Consciousness:  sedated, patient cooperative and responds to stimulation  Airway & Oxygen Therapy:Patient Spontanous Breathing and Patient connected to face mask oxgen  Post-op Assessment:  Report given to PACU RN and Post -op Vital signs reviewed and stable  Post vital signs:  Reviewed and stable  Last Vitals:  Vitals:   04/28/22 1025 04/28/22 1438  BP: 98/71 135/86  Pulse: 69 84  Resp: 11   Temp:    SpO2: 91% 638%    Complications: No apparent anesthesia complications

## 2022-04-28 NOTE — Anesthesia Postprocedure Evaluation (Signed)
Anesthesia Post Note  Patient: Gary Ferguson  Procedure(s) Performed: COMPUTER ASSISTED TOTAL KNEE ARTHROPLASTY (Left: Knee)     Patient location during evaluation: PACU Anesthesia Type: General Level of consciousness: awake and alert Pain management: pain level controlled Vital Signs Assessment: post-procedure vital signs reviewed and stable Respiratory status: spontaneous breathing, nonlabored ventilation, respiratory function stable and patient connected to nasal cannula oxygen Cardiovascular status: blood pressure returned to baseline and stable Postop Assessment: no apparent nausea or vomiting Anesthetic complications: no  No notable events documented.  Last Vitals:  Vitals:   04/28/22 1530 04/28/22 1545  BP: 123/75 104/79  Pulse: 68 73  Resp: 11 20  Temp:    SpO2: 94% 100%    Last Pain:  Vitals:   04/28/22 1438  TempSrc:   PainSc: [place]

## 2022-04-28 NOTE — Anesthesia Procedure Notes (Signed)
Anesthesia Regional Block: Adductor canal block   Pre-Anesthetic Checklist: , timeout performed,  Correct Patient, Correct Site, Correct Laterality,  Correct Procedure, Correct Position, site marked,  Risks and benefits discussed,  Surgical consent,  Pre-op evaluation,  At surgeon's request and post-op pain management  Laterality: Left  Prep: chloraprep       Needles:  Injection technique: Single-shot  Needle Type: Stimiplex     Needle Length: 9cm  Needle Gauge: 21     Additional Needles:   Procedures:,,,, ultrasound used (permanent image in chart),,    Narrative:  Start time: 04/28/2022 10:12 AM End time: 04/28/2022 10:17 AM Injection made incrementally with aspirations every 5 mL.  Performed by: Personally  Anesthesiologist: Lynda Rainwater, MD

## 2022-04-28 NOTE — Evaluation (Signed)
Physical Therapy Evaluation Patient Details Name: Gary Ferguson MRN: [id number] DOB: 1955-07-25 Today's Date: 04/28/2022  History of Present Illness  Patient is 67 y.o. male s/p L-TKA on 04/28/22. PMH: OA, GERD, macular atrophy (legally blind), DVT, bil THA, back surgeries, hx of DVT, R-TKA 2022, CHF, chronic back pain, hx of PEs   Clinical Impression  Gary Ferguson is a 67 y.o. male POD 0 s/p L-TKA. Patient reports independence with mobility at baseline. Patient is now limited by functional impairments (see PT problem list below) and demonstrated modified independence for bed mobility and min guard for transfers. Patient was able to ambulate 40 feet with RW and min guard level of assist. Patient instructed in exercise to facilitate ROM and circulation to manage edema. Provided incentive spirometer and with Vcs pt able to achieve 2569mL. Patient will benefit from continued skilled PT interventions to address impairments and progress towards PLOF. Acute PT will follow to progress mobility and stair training in preparation for safe discharge home.       Recommendations for follow up therapy are one component of a multi-disciplinary discharge planning process, led by the attending physician.  Recommendations may be updated based on patient status, additional functional criteria and insurance authorization.  Follow Up Recommendations Follow physician's recommendations for discharge plan and follow up therapies      Assistance Recommended at Discharge Frequent or constant Supervision/Assistance  Patient can return home with the following  A little help with walking and/or transfers;A little help with bathing/dressing/bathroom;Assistance with cooking/housework;Assist for transportation;Help with stairs or ramp for entrance    Equipment Recommendations None recommended by PT  Recommendations for Other Services       Functional Status Assessment Patient has had a recent decline in their functional  status and demonstrates the ability to make significant improvements in function in a reasonable and predictable amount of time.     Precautions / Restrictions Precautions Precautions: Fall;Knee Precaution Booklet Issued: No Precaution Comments: no pillow under the knee Restrictions Weight Bearing Restrictions: No Other Position/Activity Restrictions: wbat      Mobility  Bed Mobility Overal bed mobility: Modified Independent             General bed mobility comments: increased time    Transfers Overall transfer level: Needs assistance Equipment used: Rolling walker (2 wheels) Transfers: Sit to/from Stand Sit to Stand: Min guard, From elevated surface           General transfer comment: For safety only    Ambulation/Gait Ambulation/Gait assistance: Min guard Gait Distance (Feet): 40 Feet Assistive device: Rolling walker (2 wheels) Gait Pattern/deviations: Step-to pattern Gait velocity: decreased     General Gait Details: Pt ambulated with RW and min guard, no physical assist required or overt LOB noted.  Stairs            Wheelchair Mobility    Modified Rankin (Stroke Patients Only)       Balance Overall balance assessment: Needs assistance Sitting-balance support: Feet supported, No upper extremity supported Sitting balance-Leahy Scale: Good     Standing balance support: Reliant on assistive device for balance, During functional activity, Bilateral upper extremity supported Standing balance-Leahy Scale: Poor                               Pertinent Vitals/Pain Pain Assessment Pain Assessment: 0-10 Pain Score: 7  Pain Location: left knee Pain Descriptors / Indicators: Operative site guarding Pain Intervention(s): Limited  activity within patient's tolerance, Monitored during session, Ice applied, Patient requesting pain meds-RN notified, Repositioned    Home Living Family/patient expects to be discharged to:: Private  residence Living Arrangements: Other relatives Available Help at Discharge: Family;Available 24 hours/day (Grandson) Type of Home: House Home Access: Stairs to enter Entrance Stairs-Rails: None Entrance Stairs-Number of Steps: 2   Home Layout: One level Home Equipment: Agricultural consultant (2 wheels);Cane - single point;BSC/3in1      Prior Function Prior Level of Function : Independent/Modified Independent (Retired)             Mobility Comments: IND ADLs Comments: IND     Hand Dominance   Dominant Hand: Right    Extremity/Trunk Assessment   Upper Extremity Assessment Upper Extremity Assessment: Overall WFL for tasks assessed    Lower Extremity Assessment Lower Extremity Assessment: RLE deficits/detail;LLE deficits/detail RLE Deficits / Details: MMT ank DF/PF 5/5 RLE Sensation: WNL LLE Deficits / Details: MMT ank DF/PF 5/5, no extensor lag noted LLE Sensation: WNL    Cervical / Trunk Assessment Cervical / Trunk Assessment: Normal  Communication   Communication: No difficulties  Cognition Arousal/Alertness: Awake/alert Behavior During Therapy: WFL for tasks assessed/performed Overall Cognitive Status: Within Functional Limits for tasks assessed                                          General Comments      Exercises Total Joint Exercises Ankle Circles/Pumps: AROM, Both, 20 reps, Seated   Assessment/Plan    PT Assessment Patient needs continued PT services  PT Problem List Decreased strength;Decreased range of motion;Decreased activity tolerance;Decreased balance;Decreased mobility;Pain       PT Treatment Interventions DME instruction;Gait training;Stair training;Functional mobility training;Therapeutic activities;Therapeutic exercise;Balance training;Neuromuscular re-education;Patient/family education    PT Goals (Current goals can be found in the Care Plan section)  Acute Rehab PT Goals Patient Stated Goal: Get back home PT Goal  Formulation: With patient Time For Goal Achievement: 05/05/22 Potential to Achieve Goals: Good    Frequency 7X/week     Co-evaluation               AM-PAC PT "6 Clicks" Mobility  Outcome Measure Help needed turning from your back to your side while in a flat bed without using bedrails?: None Help needed moving from lying on your back to sitting on the side of a flat bed without using bedrails?: None Help needed moving to and from a bed to a chair (including a wheelchair)?: A Little Help needed standing up from a chair using your arms (e.g., wheelchair or bedside chair)?: A Little Help needed to walk in hospital room?: A Little Help needed climbing 3-5 steps with a railing? : A Little 6 Click Score: 20    End of Session Equipment Utilized During Treatment: Gait belt Activity Tolerance: Patient tolerated treatment well;No increased pain Patient left: in bed;with call bell/phone within reach;with nursing/sitter in room (Pt sitting on EOB, requested to eat dinner in that position despite PT encouragement to sit in recliner with LLE eleveated. Placed trash can under L foot with instructions to leave leg elevated and straight. RN in room and agreeable for pt to sit EOB.) Nurse Communication: Mobility status PT Visit Diagnosis: Pain;Difficulty in walking, not elsewhere classified (R26.2) Pain - Right/Left: Left Pain - part of body: Knee    Time: 1740-1800 PT Time Calculation (min) (ACUTE ONLY): 20 min  Charges:   PT Evaluation $PT Eval Low Complexity: [place], [place], DPT [facility] Office: [phone] Weekend pager: [phone]  Coolidge Breeze 04/28/2022, 6:05 PM

## 2022-04-28 NOTE — Interval H&P Note (Signed)
History and Physical Interval Note:  04/28/2022 9:12 AM  Gary Ferguson  has presented today for surgery, with the diagnosis of Left knee osteoarthritis.  The various methods of treatment have been discussed with the patient and family. After consideration of risks, benefits and other options for treatment, the patient has consented to  Procedure(s): COMPUTER ASSISTED TOTAL KNEE ARTHROPLASTY (Left) as a surgical intervention.  The patient's history has been reviewed, patient examined, no change in status, stable for surgery.  I have reviewed the patient's chart and labs.  Questions were answered to the patient's satisfaction.     Hilton Cork Siennah Barrasso

## 2022-04-28 NOTE — Op Note (Signed)
OPERATIVE REPORT  SURGEON: Rod Can, MD   ASSISTANT: Larene Pickett, PA-C  PREOPERATIVE DIAGNOSIS: Primary Left knee arthritis.   POSTOPERATIVE DIAGNOSIS: Primary Left knee arthritis.   PROCEDURE: Computer assisted Left total knee arthroplasty.   IMPLANTS: Zimmer Persona PPS Cementless CR femur, size 10. Persona 0 degree Spiked Keel OsseoTi Tibia, size G. Vivacit-E polyethelyene insert, size 10 mm, CR. TM standard patella, size 38 mm.  ANESTHESIA:  GA combined with regional for post-op pain  TOURNIQUET TIME: Not utilized.   ESTIMATED BLOOD LOSS:-200 mL    ANTIBIOTICS: 2g Ancef. 1g Vancomycin.  DRAINS: None.  COMPLICATIONS: None   CONDITION: PACU - hemodynamically stable.   BRIEF CLINICAL NOTE: Gary Ferguson is a 67 y.o. male with a long-standing history of Left knee arthritis. After failing conservative management, the patient was indicated for total knee arthroplasty. The risks, benefits, and alternatives to the procedure were explained, and the patient elected to proceed.  PROCEDURE IN DETAIL: Adductor canal block was obtained in the pre-op holding area. Once inside the operative room, spinal anesthesia was obtained, and a foley catheter was inserted. The patient was then positioned and the lower extremity was prepped and draped in the normal sterile surgical fashion.  A time-out was called verifying side and site of surgery. The patient received IV antibiotics within 60 minutes of beginning the procedure. A tourniquet was not utilized.   An anterior approach to the knee was performed utilizing a midvastus arthrotomy. A medial release was performed and the patellar fat pad was excised. Stryker imageless navigation was used to cut the distal femur perpendicular to the mechanical axis. A freehand patellar resection was performed, and the patella was sized an prepared with 3 lug holes.  Nagivation was used to make a neutral proximal tibia resection, taking 6 mm of bone from  the less affected medial side with 3 degrees of slope. The menisci were excised. A spacer block was placed, and the alignment and balance in extension were confirmed.   The distal femur was sized using the 3-degree external rotation guide referencing the posterior femoral cortex. The appropriate 4-in-1 cutting block was pinned into place. Rotation was checked using Whiteside's line, the epicondylar axis, and then confirmed with a spacer block in flexion. The remaining femoral cuts were performed, taking care to protect the MCL.  The tibia was sized and the trial tray was pinned into place. The remaining trail components were inserted. The knee was stable to varus and valgus stress through a full range of motion. The patella tracked centrally, and the PCL was well balanced. The trial components were removed, and the proximal tibial surface was prepared. Final components were impacted into place. The knee was tested for a final time and found to be well balanced.   The wound was copiously irrigated with Prontosan solution and normal saline using pule lavage.  Marcaine solution was injected into the periarticular soft tissue.  The wound was closed in layers using #1 Vicryl and Stratafix for the fascia, 2-0 Vicryl for the subcutaneous fat, 2-0 Monocryl for the deep dermal layer, 3-0 running Monocryl subcuticular Stitch, and 4-0 Monocryl stay sutures at both ends of the wound. Dermabond was applied to the skin.  Once the glue was fully dried, an Aquacell Ag and compressive dressing were applied.  The patient was transported to the recovery room in stable condition.  Sponge, needle, and instrument counts were correct at the end of the case x2.  The patient tolerated the procedure well and  there were no known complications.  Please note that a surgical assistant was a medical necessity for this procedure in order to perform it in a safe and expeditious manner. Surgical assistant was necessary to retract the  ligaments and vital neurovascular structures to prevent injury to them and also necessary for proper positioning of the limb to allow for anatomic placement of the prosthesis.

## 2022-04-28 NOTE — Discharge Instructions (Addendum)
Dr. Rod Can Total Joint Specialist [facility] [address], [place], [place] [address] [phone]  TOTAL KNEE REPLACEMENT POSTOPERATIVE DIRECTIONS    Knee Rehabilitation, Guidelines Following Surgery  Results after knee surgery are often greatly improved when you follow the exercise, range of motion and muscle strengthening exercises prescribed by your doctor. Safety measures are also important to protect the knee from further injury. Any time any of these exercises cause you to have increased pain or swelling in your knee joint, decrease the amount until you are comfortable again and slowly increase them. If you have problems or questions, call your caregiver or physical therapist for advice.   WEIGHT BEARING Weight bearing as tolerated with assist device (walker, cane, etc) as directed, use it as long as suggested by your surgeon or therapist, typically at least 4-6 weeks.  HOME CARE INSTRUCTIONS  Remove items at home which could result in a fall. This includes throw rugs or furniture in walking pathways.  Continue medications as instructed at time of discharge. You may have some home medications which will be placed on hold until you complete the course of blood thinner medication.  You may start showering once you are discharged home but do not submerge the incision under water. Just pat the incision dry and apply a dry gauze dressing on daily. Walk with walker as instructed.  You may resume a sexual relationship in one month or when given the OK by your doctor.  Use walker as long as suggested by your caregivers. Avoid periods of inactivity such as sitting longer than an hour when not asleep. This helps prevent blood clots.  You may put full weight on your legs and walk as much as is comfortable.  You may return to work once you are cleared by your doctor.  Do not drive a car for 6 weeks or until released by you surgeon.  Do not drive while  taking narcotics.  Wear the elastic stockings for three weeks following surgery during the day but you may remove then at night. Make sure you keep all of your appointments after your operation with all of your doctors and caregivers. You should call the office at the above phone number and make an appointment for approximately two weeks after the date of your surgery. Do not remove your surgical dressing. The dressing is waterproof; you may take showers in 3 days, but do not take tub baths or submerge the dressing. Please pick up a stool softener and laxative for home use as long as you are requiring pain medications. ICE to the affected knee every three hours for 30 minutes at a time and then as needed for pain and swelling.  Continue to use ice on the knee for pain and swelling from surgery. You may notice swelling that will progress down to the foot and ankle.  This is normal after surgery.  Elevate the leg when you are not up walking on it.   It is important for you to complete the blood thinner medication as prescribed by your doctor. Continue to use the breathing machine which will help keep your temperature down.  It is common for your temperature to cycle up and down following surgery, especially at night when you are not up moving around and exerting yourself.  The breathing machine keeps your lungs expanded and your temperature down.  RANGE OF MOTION AND STRENGTHENING EXERCISES  Rehabilitation of the knee is important following a knee injury or an  operation. After just a few days of immobilization, the muscles of the thigh which control the knee become weakened and shrink (atrophy). Knee exercises are designed to build up the tone and strength of the thigh muscles and to improve knee motion. Often times heat used for twenty to thirty minutes before working out will loosen up your tissues and help with improving the range of motion but do not use heat for the first two weeks following surgery.  These exercises can be done on a training (exercise) mat, on the floor, on a table or on a bed. Use what ever works the best and is most comfortable for you Knee exercises include:  Leg Lifts - While your knee is still immobilized in a splint or cast, you can do straight leg raises. Lift the leg to 60 degrees, hold for 3 sec, and slowly lower the leg. Repeat 10-20 times 2-3 times daily. Perform this exercise against resistance later as your knee gets better.  Quad and Hamstring Sets - Tighten up the muscle on the front of the thigh (Quad) and hold for 5-10 sec. Repeat this 10-20 times hourly. Hamstring sets are done by pushing the foot backward against an object and holding for 5-10 sec. Repeat as with quad sets.  A rehabilitation program following serious knee injuries can speed recovery and prevent re-injury in the future due to weakened muscles. Contact your doctor or a physical therapist for more information on knee rehabilitation.   POST-OPERATIVE OPIOID TAPER INSTRUCTIONS: It is important to wean off of your opioid medication as soon as possible. If you do not need pain medication after your surgery it is ok to stop day one. Opioids include: Codeine, Hydrocodone(Norco, Vicodin), Oxycodone(Percocet, oxycontin) and hydromorphone amongst others.  Long term and even short term use of opiods can cause: Increased pain response Dependence Constipation Depression Respiratory depression And more.  Withdrawal symptoms can include Flu like symptoms Nausea, vomiting And more Techniques to manage these symptoms Hydrate well Eat regular healthy meals Stay active Use relaxation techniques(deep breathing, meditating, yoga) Do Not substitute Alcohol to help with tapering If you have been on opioids for less than two weeks and do not have pain than it is ok to stop all together.  Plan to wean off of opioids This plan should start within one week post op of your joint replacement. Maintain the same  interval or time between taking each dose and first decrease the dose.  Cut the total daily intake of opioids by one tablet each day Next start to increase the time between doses. The last dose that should be eliminated is the evening dose.    SKILLED REHAB INSTRUCTIONS: If the patient is transferred to a skilled rehab facility following release from the hospital, a list of the current medications will be sent to the facility for the patient to continue.  When discharged from the skilled rehab facility, please have the facility set up the patient's [place] prior to being released. Also, the skilled facility will be responsible for providing the patient with their medications at time of release from the facility to include their pain medication, the muscle relaxants, and their blood thinner medication. If the patient is still at the rehab facility at time of the two week follow up appointment, the skilled rehab facility will also need to assist the patient in arranging follow up appointment in our office and any transportation needs.  MAKE SURE YOU:  Understand these instructions.  Will watch  your condition.  Will get help right away if you are not doing well or get worse.    Pick up stool softner and laxative for home use following surgery while on pain medications. Do NOT remove your dressing. You may shower.  Do not take tub baths or submerge incision under water. May shower starting three days after surgery. Please use a clean towel to pat the incision dry following showers. Continue to use ice for pain and swelling after surgery. Do not use any lotions or creams on the incision until instructed by your surgeon.  +++++++++++++++++++++++++++++++++++++++++++++++++++++++++++++++++++++++++++++++++++++++++++++++++++++++++++++++++  Information on my medicine - ELIQUIS (apixaban)  Why was Eliquis prescribed for you? Eliquis was prescribed for you to reduce the risk of  blood clots forming after orthopedic surgery.    What do You need to know about Eliquis? Take your Eliquis TWICE DAILY - one tablet in the morning and one tablet in the evening with or without food.  It would be best to take the dose about the same time each day.  If you have difficulty swallowing the tablet whole please discuss with your pharmacist how to take the medication safely.  Take Eliquis exactly as prescribed by your doctor and DO NOT stop taking Eliquis without talking to the doctor who prescribed the medication.  Stopping without other medication to take the place of Eliquis may increase your risk of developing a clot.  After discharge, you should have regular check-up appointments with your healthcare provider that is prescribing your Eliquis.  What do you do if you miss a dose? If a dose of ELIQUIS is not taken at the scheduled time, take it as soon as possible on the same day and twice-daily administration should be resumed.  The dose should not be doubled to make up for a missed dose.  Do not take more than one tablet of ELIQUIS at the same time.  Important Safety Information A possible side effect of Eliquis is bleeding. You should call your healthcare provider right away if you experience any of the following: Bleeding from an injury or your nose that does not stop. Unusual colored urine (red or dark brown) or unusual colored stools (red or black). Unusual bruising for unknown reasons. A serious fall or if you hit your head (even if there is no bleeding).  Some medicines may interact with Eliquis and might increase your risk of bleeding or clotting while on Eliquis. To help avoid this, consult your healthcare provider or pharmacist prior to using any new prescription or non-prescription medications, including herbals, vitamins, non-steroidal anti-inflammatory drugs (NSAIDs) and supplements.  This website has more information on Eliquis (apixaban):  http://www.eliquis.com/eliquis/home  

## 2022-04-28 NOTE — Plan of Care (Signed)
  Problem: Education: Goal: Knowledge of General Education information will improve Description: Including pain rating scale, medication(s)/side effects and non-pharmacologic comfort measures Outcome: Progressing

## 2022-04-29 ENCOUNTER — Encounter (HOSPITAL_COMMUNITY): Payer: Self-pay | Admitting: Orthopedic Surgery

## 2022-04-29 DIAGNOSIS — M170 Bilateral primary osteoarthritis of knee: Secondary | ICD-10-CM | POA: Diagnosis not present

## 2022-04-29 DIAGNOSIS — Z96652 Presence of left artificial knee joint: Secondary | ICD-10-CM

## 2022-04-29 LAB — CBC
HCT: 39.7 % (ref 39.0–52.0)
Hemoglobin: 13.2 g/dL (ref 13.0–17.0)
MCH: 29.4 pg (ref 26.0–34.0)
MCHC: 33.2 g/dL (ref 30.0–36.0)
MCV: 88.4 fL (ref 80.0–100.0)
Platelets: 157 10*3/uL (ref 150–400)
RBC: 4.49 MIL/uL (ref 4.22–5.81)
RDW: 13.8 % (ref 11.5–15.5)
WBC: 10 10*3/uL (ref 4.0–10.5)
nRBC: 0 % (ref 0.0–0.2)

## 2022-04-29 LAB — BASIC METABOLIC PANEL
Anion gap: 9 (ref 5–15)
BUN: 19 mg/dL (ref 8–23)
CO2: 22 mmol/L (ref 22–32)
Calcium: 8.1 mg/dL — ABNORMAL LOW (ref 8.9–10.3)
Chloride: 105 mmol/L (ref 98–111)
Creatinine, Ser: 1.22 mg/dL (ref 0.61–1.24)
GFR, Estimated: >60 mL/min (ref >60)
Glucose, Bld: 132 mg/dL — ABNORMAL HIGH (ref 70–99)
Potassium: 4.4 mmol/L (ref 3.5–5.1)
Sodium: 136 mmol/L (ref 135–145)

## 2022-04-29 LAB — GLUCOSE, CAPILLARY: Glucose-Capillary: 139 mg/dL — ABNORMAL HIGH (ref 70–99)

## 2022-04-29 MED ORDER — DOCUSATE SODIUM 100 MG PO CAPS: 100.0000 mg | ORAL_CAPSULE | Freq: Two times a day (BID) | ORAL | 0 refills | Status: AC

## 2022-04-29 MED ORDER — SENNA 8.6 MG PO TABS: 2.0000 | ORAL_TABLET | Freq: Every day | ORAL | 0 refills | Status: AC

## 2022-04-29 MED ORDER — APIXABAN 2.5 MG PO TABS: 2.5000 mg | ORAL_TABLET | Freq: Two times a day (BID) | ORAL | 0 refills | Status: DC

## 2022-04-29 MED ORDER — POLYETHYLENE GLYCOL 3350 17 G PO PACK: 17.0000 g | PACK | Freq: Every day | ORAL | 0 refills | Status: AC | PRN

## 2022-04-29 MED ORDER — OXYCODONE HCL 5 MG PO TABS: 5.0000 mg | ORAL_TABLET | ORAL | 0 refills | Status: DC | PRN

## 2022-04-29 MED ORDER — METHOCARBAMOL 500 MG PO TABS: 500.0000 mg | ORAL_TABLET | Freq: Four times a day (QID) | ORAL | 0 refills | Status: DC | PRN

## 2022-04-29 MED ORDER — ONDANSETRON HCL 4 MG PO TABS: 4.0000 mg | ORAL_TABLET | Freq: Three times a day (TID) | ORAL | 0 refills | Status: AC | PRN

## 2022-04-29 NOTE — Progress Notes (Addendum)
Physical Therapy Treatment Patient Details Name: Gary Ferguson MRN: [id number] DOB: 1955/08/01 Today's Date: 04/29/2022   History of Present Illness Patient is 67 y.o. male s/p L-TKA on 04/28/22. PMH: OA, GERD, macular atrophy (legally blind), DVT, bil THA, back surgeries, hx of DVT, R-TKA 2022, CHF, chronic back pain, hx of PEs    PT Comments    Follow up session completed after pain meds received this AM. Pt demonstrates safe technique for bed mob, transfers, and gait with RW. Completed stair mobility with no LOB throughout and safe management of walker placement. He completed 2 bouts of stair negotiation with no difficulty and EOS reviewed additional seated exercises for ROM. Pt able to safely complete lower body dressing with guarding for safety. He is mobilizing at safe level for discharge home with assist from family. Will continue to progress as able.    Recommendations for follow up therapy are one component of a multi-disciplinary discharge planning process, led by the attending physician.  Recommendations may be updated based on patient status, additional functional criteria and insurance authorization.  Follow Up Recommendations  Follow physician's recommendations for discharge plan and follow up therapies     Assistance Recommended at Discharge Frequent or constant Supervision/Assistance  Patient can return home with the following A little help with walking and/or transfers;A little help with bathing/dressing/bathroom;Assistance with cooking/housework;Assist for transportation;Help with stairs or ramp for entrance   Equipment Recommendations  None recommended by PT    Recommendations for Other Services       Precautions / Restrictions Precautions Precautions: Fall;Knee Precaution Booklet Issued: No Precaution Comments: no pillow under the knee Restrictions Weight Bearing Restrictions: No Other Position/Activity Restrictions: wbat     Mobility  Bed Mobility Overal bed  mobility: Modified Independent             General bed mobility comments: increased time    Transfers Overall transfer level: Needs assistance Equipment used: Rolling walker (2 wheels) Transfers: Sit to/from Stand Sit to Stand: Supervision           General transfer comment: For safety only    Ambulation/Gait Ambulation/Gait assistance: Supervision Gait Distance (Feet): 250 Feet Assistive device: Rolling walker (2 wheels) Gait Pattern/deviations: Step-through pattern Gait velocity: fair     General Gait Details: pt managed walker safely, no LOB.   Stairs Stairs: Yes Stairs assistance: Min guard Stair Management: No rails, One rail Right, Step to pattern, Forwards, With walker Number of Stairs: 4 (2x2) General stair comments: Patient able to manage RW for stair sequencing and uses bar on Rt for ascending 2 steps. pt demonstrated good recall for "up with good, down with bad" no LOB noted throughout.   Wheelchair Mobility    Modified Rankin (Stroke Patients Only)       Balance Overall balance assessment: Needs assistance Sitting-balance support: Feet supported, No upper extremity supported Sitting balance-Leahy Scale: Good     Standing balance support: Reliant on assistive device for balance, During functional activity, Bilateral upper extremity supported Standing balance-Leahy Scale: Poor                              Cognition Arousal/Alertness: Awake/alert Behavior During Therapy: WFL for tasks assessed/performed Overall Cognitive Status: Within Functional Limits for tasks assessed  Exercises Total Joint Exercises Ankle Circles/Pumps: AROM, Both, 20 reps, Seated Quad Sets: AROM, Both, 10 reps Heel Slides: AROM, AAROM, 10 reps, Left Hip ABduction/ADduction: AROM, Both, 10 reps    General Comments        Pertinent Vitals/Pain Pain Assessment Pain Assessment: 0-10 Pain  Score: 5  Pain Location: left knee Pain Descriptors / Indicators: Operative site guarding Pain Intervention(s): Limited activity within patient's tolerance, Monitored during session, Premedicated before session, Repositioned, Ice applied    Home Living                          Prior Function            PT Goals (current goals can now be found in the care plan section) Acute Rehab PT Goals Patient Stated Goal: Get back home PT Goal Formulation: With patient Time For Goal Achievement: 05/05/22 Potential to Achieve Goals: Good Progress towards PT goals: Progressing toward goals    Frequency    7X/week      PT Plan Current plan remains appropriate    Co-evaluation              AM-PAC PT "6 Clicks" Mobility   Outcome Measure  Help needed turning from your back to your side while in a flat bed without using bedrails?: None Help needed moving from lying on your back to sitting on the side of a flat bed without using bedrails?: None Help needed moving to and from a bed to a chair (including a wheelchair)?: A Little Help needed standing up from a chair using your arms (e.g., wheelchair or bedside chair)?: A Little Help needed to walk in hospital room?: A Little Help needed climbing 3-5 steps with a railing? : A Little 6 Click Score: 20    End of Session Equipment Utilized During Treatment: Gait belt Activity Tolerance: Patient tolerated treatment well;No increased pain Patient left: in bed;with call bell/phone within reach;with nursing/sitter in room (Pt sitting on EOB, requested to eat dinner in that position despite PT encouragement to sit in recliner with LLE eleveated. Placed trash can under L foot with instructions to leave leg elevated and straight. RN in room and agreeable for pt to sit EOB.) Nurse Communication: Mobility status PT Visit Diagnosis: Pain;Difficulty in walking, not elsewhere classified (R26.2) Pain - Right/Left: Left Pain - part of body:  Knee     Time: 4540-9811 PT Time Calculation (min) (ACUTE ONLY): 13 min  Charges:  $Gait Training: 8-22 mins                    Verner Mould, DPT Acute Rehabilitation Services Office [phone]  04/29/22 11:40 AM

## 2022-04-29 NOTE — Progress Notes (Signed)
    Subjective:  Patient reports pain as mild to moderate.  Denies N/V/CP/SOB/Abd pain. He states that his pain is controlled with the medication. He denies any tingling or numbness in LE bilaterally.   Objective:   VITALS:   Vitals:   04/29/22 0136 04/29/22 0528 04/29/22 0603 04/29/22 0938  BP: (!) 135/92 (!) 144/86 (!) 142/84 126/83  Pulse: 93 (!) 104 90 (!) 103  Resp: 17 16 17 17   Temp: 98.2 F (36.8 C) 98.2 F (36.8 C) 98.2 F (36.8 C) 97.6 F (36.4 C)  TempSrc: Oral Oral Oral Oral  SpO2: 96% 100% 100% 99%  Weight:      Height:        Patient lying comfortably in bed. NAD.  Neurologically intact ABD soft Neurovascular intact Sensation intact distally Intact pulses distally Dorsiflexion/Plantar flexion intact Incision: dressing C/D/I No cellulitis present Compartment soft He is moving his leg and performing SLR in bed this morning.  Ace bandage removed and TED hose placed.   Lab Results  Component Value Date   WBC 10.0 04/29/2022   HGB 13.2 04/29/2022   HCT 39.7 04/29/2022   MCV 88.4 04/29/2022   PLT 157 04/29/2022   BMET    Component Value Date/Time   NA 136 04/29/2022 0338   K 4.4 04/29/2022 0338   CL 105 04/29/2022 0338   CO2 22 04/29/2022 0338   GLUCOSE 132 (H) 04/29/2022 0338   BUN 19 04/29/2022 0338   CREATININE 1.22 04/29/2022 0338   CALCIUM 8.1 (L) 04/29/2022 0338   GFRNONAA >60 04/29/2022 0338     Assessment/Plan: 1 Day Post-Op   Principal Problem:   Osteoarthritis of left knee   WBAT with walker DVT ppx:  Eliquis , SCDs, TEDS PO pain control PT/OT: Patient ambulated 40 feet with PT yesterday. Continue PT today.  Dispo: D/c home with OPPT once cleared with PT. Continue incentive spirometry    Charlott Rakes, PA-C 04/29/2022, 11:00 AM   [facility]  [place] [address], [address], [place], [place] [address] Phone: [phone] [email or web address] Facebook  [employer]

## 2022-04-29 NOTE — Progress Notes (Signed)
Physical Therapy Treatment Patient Details Name: Gary Ferguson MRN: [id number] DOB: 1955-12-13 Today's Date: 04/29/2022   History of Present Illness Patient is 67 y.o. male s/p L-TKA on 04/28/22. PMH: OA, GERD, macular atrophy (legally blind), DVT, bil THA, back surgeries, hx of DVT, R-TKA 2022, CHF, chronic back pain, hx of PEs    PT Comments    Patient making excellent progress with mobility and ambulated ~ 240' with RW and demonstrated excellent carryover from prior therapy for safe walker management. Pt completed exercises at EOS for ROM, strength, and circulation. Will continue to progress pt as able in preparation for safe discharge home and complete stair mobility.    Recommendations for follow up therapy are one component of a multi-disciplinary discharge planning process, led by the attending physician.  Recommendations may be updated based on patient status, additional functional criteria and insurance authorization.  Follow Up Recommendations  Follow physician's recommendations for discharge plan and follow up therapies     Assistance Recommended at Discharge Frequent or constant Supervision/Assistance  Patient can return home with the following A little help with walking and/or transfers;A little help with bathing/dressing/bathroom;Assistance with cooking/housework;Assist for transportation;Help with stairs or ramp for entrance   Equipment Recommendations  None recommended by PT    Recommendations for Other Services       Precautions / Restrictions Precautions Precautions: Fall;Knee Precaution Booklet Issued: No Precaution Comments: no pillow under the knee Restrictions Weight Bearing Restrictions: No Other Position/Activity Restrictions: wbat     Mobility  Bed Mobility Overal bed mobility: Modified Independent             General bed mobility comments: increased time    Transfers Overall transfer level: Needs assistance Equipment used: Rolling walker (2  wheels) Transfers: Sit to/from Stand Sit to Stand: Min guard, Supervision           General transfer comment: For safety only    Ambulation/Gait Ambulation/Gait assistance: Min guard, Supervision Gait Distance (Feet): 240 Feet Assistive device: Rolling walker (2 wheels) Gait Pattern/deviations: Step-through pattern Gait velocity: fair     General Gait Details: pt demonstrates good carryover from prior session and Rt TKA ~ 1year ago. pt maintain safe position to RW and smooth step through pattern.   Stairs             Wheelchair Mobility    Modified Rankin (Stroke Patients Only)       Balance Overall balance assessment: Needs assistance Sitting-balance support: Feet supported, No upper extremity supported Sitting balance-Leahy Scale: Good     Standing balance support: Reliant on assistive device for balance, During functional activity, Bilateral upper extremity supported Standing balance-Leahy Scale: Poor                              Cognition Arousal/Alertness: Awake/alert Behavior During Therapy: WFL for tasks assessed/performed Overall Cognitive Status: Within Functional Limits for tasks assessed                                          Exercises Total Joint Exercises Ankle Circles/Pumps: AROM, Both, 20 reps, Seated Quad Sets: AROM, Both, 10 reps Heel Slides: AROM, AAROM, 10 reps, Left Hip ABduction/ADduction: AROM, Both, 10 reps    General Comments        Pertinent Vitals/Pain Pain Assessment Pain Assessment: 0-10 Pain Score: 5  Pain Location: left knee Pain Descriptors / Indicators: Operative site guarding Pain Intervention(s): Limited activity within patient's tolerance, Monitored during session, Repositioned    Home Living                          Prior Function            PT Goals (current goals can now be found in the care plan section) Acute Rehab PT Goals Patient Stated Goal: Get back  home PT Goal Formulation: With patient Time For Goal Achievement: 05/05/22 Potential to Achieve Goals: Good Progress towards PT goals: Progressing toward goals    Frequency    7X/week      PT Plan Current plan remains appropriate    Co-evaluation              AM-PAC PT "6 Clicks" Mobility   Outcome Measure  Help needed turning from your back to your side while in a flat bed without using bedrails?: None Help needed moving from lying on your back to sitting on the side of a flat bed without using bedrails?: None Help needed moving to and from a bed to a chair (including a wheelchair)?: A Little Help needed standing up from a chair using your arms (e.g., wheelchair or bedside chair)?: A Little Help needed to walk in hospital room?: A Little Help needed climbing 3-5 steps with a railing? : A Little 6 Click Score: 20    End of Session Equipment Utilized During Treatment: Gait belt Activity Tolerance: Patient tolerated treatment well;No increased pain Patient left: in bed;with call bell/phone within reach;with nursing/sitter in room (Pt sitting on EOB, requested to eat dinner in that position despite PT encouragement to sit in recliner with LLE eleveated. Placed trash can under L foot with instructions to leave leg elevated and straight. RN in room and agreeable for pt to sit EOB.) Nurse Communication: Mobility status PT Visit Diagnosis: Pain;Difficulty in walking, not elsewhere classified (R26.2) Pain - Right/Left: Left Pain - part of body: Knee     Time: 3220-2542 PT Time Calculation (min) (ACUTE ONLY): 21 min  Charges:  $Therapeutic Exercise: 8-22 mins                     Verner Mould, DPT Acute Rehabilitation Services Office [phone]  04/29/22 11:28 AM

## 2022-05-03 DIAGNOSIS — R269 Unspecified abnormalities of gait and mobility: Secondary | ICD-10-CM | POA: Diagnosis not present

## 2022-05-03 DIAGNOSIS — M25662 Stiffness of left knee, not elsewhere classified: Secondary | ICD-10-CM | POA: Diagnosis not present

## 2022-05-03 DIAGNOSIS — M25562 Pain in left knee: Secondary | ICD-10-CM | POA: Diagnosis not present

## 2022-05-05 DIAGNOSIS — M25662 Stiffness of left knee, not elsewhere classified: Secondary | ICD-10-CM | POA: Diagnosis not present

## 2022-05-05 DIAGNOSIS — M25562 Pain in left knee: Secondary | ICD-10-CM | POA: Diagnosis not present

## 2022-05-05 DIAGNOSIS — R269 Unspecified abnormalities of gait and mobility: Secondary | ICD-10-CM | POA: Diagnosis not present

## 2022-05-10 DIAGNOSIS — M25662 Stiffness of left knee, not elsewhere classified: Secondary | ICD-10-CM | POA: Diagnosis not present

## 2022-05-10 DIAGNOSIS — M25562 Pain in left knee: Secondary | ICD-10-CM | POA: Diagnosis not present

## 2022-05-10 DIAGNOSIS — R269 Unspecified abnormalities of gait and mobility: Secondary | ICD-10-CM | POA: Diagnosis not present

## 2022-05-11 DIAGNOSIS — Z96652 Presence of left artificial knee joint: Secondary | ICD-10-CM | POA: Diagnosis not present

## 2022-05-11 DIAGNOSIS — Z471 Aftercare following joint replacement surgery: Secondary | ICD-10-CM | POA: Diagnosis not present

## 2022-05-12 DIAGNOSIS — M25562 Pain in left knee: Secondary | ICD-10-CM | POA: Diagnosis not present

## 2022-05-12 DIAGNOSIS — R269 Unspecified abnormalities of gait and mobility: Secondary | ICD-10-CM | POA: Diagnosis not present

## 2022-05-12 DIAGNOSIS — M25662 Stiffness of left knee, not elsewhere classified: Secondary | ICD-10-CM | POA: Diagnosis not present

## 2022-05-14 DIAGNOSIS — R269 Unspecified abnormalities of gait and mobility: Secondary | ICD-10-CM | POA: Diagnosis not present

## 2022-05-14 DIAGNOSIS — M25662 Stiffness of left knee, not elsewhere classified: Secondary | ICD-10-CM | POA: Diagnosis not present

## 2022-05-14 DIAGNOSIS — M25562 Pain in left knee: Secondary | ICD-10-CM | POA: Diagnosis not present

## 2022-05-17 DIAGNOSIS — R269 Unspecified abnormalities of gait and mobility: Secondary | ICD-10-CM | POA: Diagnosis not present

## 2022-05-17 DIAGNOSIS — M25662 Stiffness of left knee, not elsewhere classified: Secondary | ICD-10-CM | POA: Diagnosis not present

## 2022-05-17 DIAGNOSIS — M25562 Pain in left knee: Secondary | ICD-10-CM | POA: Diagnosis not present

## 2022-05-19 DIAGNOSIS — M25662 Stiffness of left knee, not elsewhere classified: Secondary | ICD-10-CM | POA: Diagnosis not present

## 2022-05-19 DIAGNOSIS — R269 Unspecified abnormalities of gait and mobility: Secondary | ICD-10-CM | POA: Diagnosis not present

## 2022-05-19 DIAGNOSIS — M25562 Pain in left knee: Secondary | ICD-10-CM | POA: Diagnosis not present

## 2022-05-21 DIAGNOSIS — M25662 Stiffness of left knee, not elsewhere classified: Secondary | ICD-10-CM | POA: Diagnosis not present

## 2022-05-21 DIAGNOSIS — M25562 Pain in left knee: Secondary | ICD-10-CM | POA: Diagnosis not present

## 2022-05-21 DIAGNOSIS — R269 Unspecified abnormalities of gait and mobility: Secondary | ICD-10-CM | POA: Diagnosis not present

## 2022-05-24 DIAGNOSIS — M25562 Pain in left knee: Secondary | ICD-10-CM | POA: Diagnosis not present

## 2022-05-24 DIAGNOSIS — R269 Unspecified abnormalities of gait and mobility: Secondary | ICD-10-CM | POA: Diagnosis not present

## 2022-05-24 DIAGNOSIS — M25662 Stiffness of left knee, not elsewhere classified: Secondary | ICD-10-CM | POA: Diagnosis not present

## 2022-05-26 DIAGNOSIS — M25662 Stiffness of left knee, not elsewhere classified: Secondary | ICD-10-CM | POA: Diagnosis not present

## 2022-05-26 DIAGNOSIS — M25562 Pain in left knee: Secondary | ICD-10-CM | POA: Diagnosis not present

## 2022-05-26 DIAGNOSIS — R269 Unspecified abnormalities of gait and mobility: Secondary | ICD-10-CM | POA: Diagnosis not present

## 2022-05-28 DIAGNOSIS — M25562 Pain in left knee: Secondary | ICD-10-CM | POA: Diagnosis not present

## 2022-05-28 DIAGNOSIS — R269 Unspecified abnormalities of gait and mobility: Secondary | ICD-10-CM | POA: Diagnosis not present

## 2022-05-28 DIAGNOSIS — M25662 Stiffness of left knee, not elsewhere classified: Secondary | ICD-10-CM | POA: Diagnosis not present

## 2022-05-31 DIAGNOSIS — M25662 Stiffness of left knee, not elsewhere classified: Secondary | ICD-10-CM | POA: Diagnosis not present

## 2022-05-31 DIAGNOSIS — M25562 Pain in left knee: Secondary | ICD-10-CM | POA: Diagnosis not present

## 2022-05-31 DIAGNOSIS — R269 Unspecified abnormalities of gait and mobility: Secondary | ICD-10-CM | POA: Diagnosis not present

## 2022-06-02 DIAGNOSIS — M25562 Pain in left knee: Secondary | ICD-10-CM | POA: Diagnosis not present

## 2022-06-02 DIAGNOSIS — R269 Unspecified abnormalities of gait and mobility: Secondary | ICD-10-CM | POA: Diagnosis not present

## 2022-06-02 DIAGNOSIS — M25662 Stiffness of left knee, not elsewhere classified: Secondary | ICD-10-CM | POA: Diagnosis not present

## 2022-06-04 DIAGNOSIS — M25562 Pain in left knee: Secondary | ICD-10-CM | POA: Diagnosis not present

## 2022-06-04 DIAGNOSIS — R269 Unspecified abnormalities of gait and mobility: Secondary | ICD-10-CM | POA: Diagnosis not present

## 2022-06-04 DIAGNOSIS — M25662 Stiffness of left knee, not elsewhere classified: Secondary | ICD-10-CM | POA: Diagnosis not present

## 2022-06-07 DIAGNOSIS — R269 Unspecified abnormalities of gait and mobility: Secondary | ICD-10-CM | POA: Diagnosis not present

## 2022-06-07 DIAGNOSIS — M25662 Stiffness of left knee, not elsewhere classified: Secondary | ICD-10-CM | POA: Diagnosis not present

## 2022-06-07 DIAGNOSIS — M25562 Pain in left knee: Secondary | ICD-10-CM | POA: Diagnosis not present

## 2022-06-08 DIAGNOSIS — Z96652 Presence of left artificial knee joint: Secondary | ICD-10-CM | POA: Diagnosis not present

## 2022-06-08 DIAGNOSIS — Z471 Aftercare following joint replacement surgery: Secondary | ICD-10-CM | POA: Diagnosis not present

## 2022-06-11 ENCOUNTER — Telehealth (HOSPITAL_COMMUNITY): Payer: Self-pay | Admitting: Pharmacy Technician

## 2022-06-11 ENCOUNTER — Other Ambulatory Visit (HOSPITAL_COMMUNITY): Payer: Self-pay

## 2022-06-11 MED ORDER — MEXILETINE HCL 150 MG PO CAPS: 300.0000 mg | ORAL_CAPSULE | Freq: Two times a day (BID) | ORAL | 2 refills | Status: DC

## 2022-06-11 NOTE — Telephone Encounter (Signed)
Patient Advocate Encounter   Received notification from [facility] that prior authorization for [place] is required.   PA submitted on CoverMyMeds Key  [id number] Status is pending   Will continue to follow.

## 2022-06-14 NOTE — Telephone Encounter (Signed)
Patient Advocate Encounter  Prior authorization has been approved, effective 03/13/22 - 06/11/23. Contacted pharmacy to confirm, tech stated they are showing a $4.50 copay.  Clista Bernhardt, CPhT Rx Patient Advocate Phone: [phone]

## 2022-06-28 ENCOUNTER — Other Ambulatory Visit (HOSPITAL_COMMUNITY): Payer: Self-pay

## 2022-06-28 DIAGNOSIS — I5022 Chronic systolic (congestive) heart failure: Secondary | ICD-10-CM

## 2022-06-29 ENCOUNTER — Ambulatory Visit (HOSPITAL_BASED_OUTPATIENT_CLINIC_OR_DEPARTMENT_OTHER)
Admission: RE | Admit: 2022-06-29 | Discharge: 2022-06-29 | Disposition: A | Discharge status: Home / Self Care | Payer: No Typology Code available for payment source | Source: Ambulatory Visit | Attending: Internal Medicine | Admitting: Internal Medicine

## 2022-06-29 ENCOUNTER — Encounter (HOSPITAL_COMMUNITY): Payer: Self-pay | Admitting: Internal Medicine

## 2022-06-29 ENCOUNTER — Ambulatory Visit (HOSPITAL_COMMUNITY)
Admission: RE | Admit: 2022-06-29 | Discharge: 2022-06-29 | Disposition: A | Discharge status: Home / Self Care | Payer: No Typology Code available for payment source | Source: Ambulatory Visit | Attending: Internal Medicine | Admitting: Internal Medicine

## 2022-06-29 VITALS — BP 122/88 | HR 77 | Wt 193.2 lb

## 2022-06-29 DIAGNOSIS — Z7984 Long term (current) use of oral hypoglycemic drugs: Secondary | ICD-10-CM | POA: Insufficient documentation

## 2022-06-29 DIAGNOSIS — Z79899 Other long term (current) drug therapy: Secondary | ICD-10-CM | POA: Diagnosis not present

## 2022-06-29 DIAGNOSIS — I5022 Chronic systolic (congestive) heart failure: Secondary | ICD-10-CM

## 2022-06-29 DIAGNOSIS — G8929 Other chronic pain: Secondary | ICD-10-CM | POA: Insufficient documentation

## 2022-06-29 DIAGNOSIS — Z86718 Personal history of other venous thrombosis and embolism: Secondary | ICD-10-CM | POA: Insufficient documentation

## 2022-06-29 DIAGNOSIS — H548 Legal blindness, as defined in USA: Secondary | ICD-10-CM | POA: Diagnosis not present

## 2022-06-29 DIAGNOSIS — I428 Other cardiomyopathies: Secondary | ICD-10-CM | POA: Diagnosis not present

## 2022-06-29 DIAGNOSIS — I493 Ventricular premature depolarization: Secondary | ICD-10-CM | POA: Insufficient documentation

## 2022-06-29 DIAGNOSIS — R0602 Shortness of breath: Secondary | ICD-10-CM | POA: Insufficient documentation

## 2022-06-29 DIAGNOSIS — I2510 Atherosclerotic heart disease of native coronary artery without angina pectoris: Secondary | ICD-10-CM | POA: Diagnosis not present

## 2022-06-29 LAB — ECHOCARDIOGRAM COMPLETE
AR max vel: 2.8 cm2
AV Area VTI: 2.7 cm2
AV Area mean vel: 2.67 cm2
AV Mean grad: 3 mmHg
AV Peak grad: 5.9 mmHg
Ao pk vel: 1.21 m/s
Area-P 1/2: 3.72 cm2
Calc EF: 35.8 %
MV VTI: 2.45 cm2
S' Lateral: 5.1 cm
Single Plane A2C EF: 37.7 %
Single Plane A4C EF: 30.9 %

## 2022-06-29 LAB — COMPREHENSIVE METABOLIC PANEL
ALT: 18 U/L (ref 0–44)
AST: 22 U/L (ref 15–41)
Albumin: 3.7 g/dL (ref 3.5–5.0)
Alkaline Phosphatase: 75 U/L (ref 38–126)
Anion gap: 7 (ref 5–15)
BUN: 15 mg/dL (ref 8–23)
CO2: 26 mmol/L (ref 22–32)
Calcium: 9.3 mg/dL (ref 8.9–10.3)
Chloride: 107 mmol/L (ref 98–111)
Creatinine, Ser: 1.09 mg/dL (ref 0.61–1.24)
GFR, Estimated: >60 mL/min (ref >60)
Glucose, Bld: 115 mg/dL — ABNORMAL HIGH (ref 70–99)
Potassium: 4.1 mmol/L (ref 3.5–5.1)
Sodium: 140 mmol/L (ref 135–145)
Total Bilirubin: 0.7 mg/dL (ref 0.3–1.2)
Total Protein: 6.4 g/dL — ABNORMAL LOW (ref 6.5–8.1)

## 2022-06-29 LAB — BRAIN NATRIURETIC PEPTIDE: B Natriuretic Peptide: 39.1 pg/mL (ref 0.0–100.0)

## 2022-06-29 NOTE — Progress Notes (Signed)
Advanced Heart Failure Clinic Note   Primary Care: Dr Gerarda Fraction Primary Cardiologist: Dr Johnsie Cancel  HF Cardiologist: Dr Haroldine Laws.   HPI: Gary Ferguson is a 67 y.o.with history of blindness, DVT with IVC filter, TKR , and recently diagnosed HFrEF 04/2021.    Had knee replacement 02/19/21. Post op course uncomplicated.  Completed PT 2 weeks early. 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    Admitted 04/11/20 with increased dyspnea. ECHO with reduced EF < 20%. Transferred to [facility] for HF management/cath. Diuresed with IV lasix.  Cath with mod non obstructive cad and normal filling pressures/preserved CO. CMRI with EF < 22% RV normal and now LGE to suggest scar. GDMT limited due to soft BP. D/C weight 197 pounds.   Saw Dr Haroldine Laws on 05/15/21. Had high PVC burden so mexiletine was started. Had cough so lisinopril was switched to losartan.   Echo 3/23 with EF 20-25%, RV ok  Echo 10/27/21 EF 20-25% RV normal. Repeat Zio placed to ensure PVCs adequately suppressed with plans to repeat echo, then refer for ICD if EF remains < 35%.  Zio (9/23) showed 1% PVC burden, mostly NSR.   Today he returns for HF follow up. Feels good. 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 No SOB. Did have one episode of CP but turned out to be gas. No angina. No edema, orthopnea or PND  Echo 06/29/22 EF 30-35% RV ok Personally reviewed  Echo 02/17/22 30-35%  Cardiac Testing   - Zio (9/23): mostly NSR, 1% PVC burden  - Echo (7/23): EF 20-25%, RV ok  - Echo (3/23): EF 20-25% RV normal   - Zio (1/23): mostly NSR, 5 runs NSVT, frequent PVCs (9% burden)  - Echo (1/23): EF < 20%  - CMRI (1/23): LVEF < 22%. Normal RV. No LGE  to suggest scar.    - Cath (1/23):  1st [name] lesion is 30% stenosed.   1st Diag lesion is 70% stenosed.   Prox LAD to Mid LAD lesion is 40% stenosed.   Prox RCA to Mid RCA  lesion is 30% stenosed.   The left ventricular ejection fraction is less than 25% by visual estimate.   Ao = 85/60 (70) LV = 88/19 RA = 3 RV = 26/4 PA = 29/11 (20) PCW = 16 Fick cardiac output/index = 6.6/3.2 PVR = 0.6 WU SVR = 810 FA sat = 97% PA sat = 72%, 74% SVC sat = 73%    1. Moderate non-obstructive CAD 2. Severe NICM EF < 20% 3. Normal filling pressures and cardiac output  Past Medical History:  Diagnosis Date   Arthritis    CHF (congestive heart failure) (HCC)    Chronic back pain    Complication of anesthesia    Patient said that after surgery his throat closed up in 2011   GERD (gastroesophageal reflux disease)    History of DVT of lower extremity    History of kidney stones    Hx of blood clots 2010   Macular atrophy, retinal    bilateral   PONV (postoperative nausea and vomiting)    Torn rotator cuff    right shoulder   Current Outpatient Medications  Medication Sig Dispense Refill   acetaminophen (TYLENOL) 500 MG tablet Take 1,000 mg by mouth every 6 (six) hours as needed for moderate pain.     ASPIRIN 81 PO Take  81 mg by mouth daily.     carvedilol (COREG) 3.125 MG tablet TAKE ONE TABLET (3.125MG  TOTAL) BY MOUTHTWO TIMES DAILY WITH A MEAL 60 tablet 6   ciprofloxacin (CIPRO) 500 MG tablet Take 500 mg by mouth 2 (two) times daily.     clotrimazole-betamethasone (LOTRISONE) cream Apply 1 application topically 2 (two) times daily.     diphenhydramine-acetaminophen (TYLENOL PM) 25-500 MG TABS tablet Take 2 tablets by mouth at bedtime.     empagliflozin (JARDIANCE) 10 MG TABS tablet Take 1 tablet (10 mg total) by mouth daily before breakfast. 90 tablet 3   fexofenadine (ALLEGRA) 60 MG tablet Take 60 mg by mouth daily as needed for allergies.     losartan (COZAAR) 25 MG tablet TAKE ONE-HALF TABLET (12.5MG  TOTAL) BY MOUTH AT BEDTIME 45 tablet 3   mexiletine (MEXITIL) 150 MG capsule Take 2 capsules (300 mg total) by mouth 2 (two) times daily. 180 capsule 2    Multiple Vitamin (MULTIVITAMIN) capsule Take 1 capsule by mouth daily.     Omega 3 1000 MG CAPS Take 1,000 mg by mouth daily.     omeprazole (PRILOSEC) 20 MG capsule Take 20 mg by mouth every morning.      ondansetron (ZOFRAN) 4 MG tablet Take 1 tablet (4 mg total) by mouth every 8 (eight) hours as needed for nausea or vomiting. 30 tablet 0   spironolactone (ALDACTONE) 25 MG tablet TAKE ONE-HALF TABLET (12.5MG  TOTAL) BY MOUTH AT BEDTIME 45 tablet 3   No current facility-administered medications for this encounter.   Allergies  Allergen Reactions   Penicillins Other (See Comments)    REACTION UNSPECIFIED  Has patient had a PCN reaction causing immediate rash, facial/tongue/throat swelling, SOB or lightheadedness with hypotension: unknown childhood allergy Has patient had a PCN reaction causing severe rash involving mucus membranes or skin necrosis: unsure Has patient had a PCN reaction that required hospitalization unsure Has patient had a PCN reaction occurring within the last 10 years: No If all of the above answers are "NO", then may proceed with Cephalosporin use.    Social History   Socioeconomic History   Marital status: Divorced    Spouse name: Not on file   Number of children: 3   Years of education: Not on file   Highest education level: Some college, no degree  Occupational History   Occupation: semi-retired    Comment: produce farmer  Tobacco Use   Smoking status: Never   Smokeless tobacco: Never  Vaping Use   Vaping Use: Never used  Substance and Sexual Activity   Alcohol use: No   Drug use: Never   Sexual activity: Not on file  Other Topics Concern   Not on file  Social History Narrative   Not on file   Social Determinants of Health   Financial Resource Strain: Low Risk  (04/13/2021)   Overall Financial Resource Strain (CARDIA)    Difficulty of Paying Living Expenses: Not hard at all  Food Insecurity: No Food Insecurity (04/28/2022)   Hunger Vital Sign     Worried About Running Out of Food in the Last Year: Never true    Zearing in the Last Year: Never true  Transportation Needs: No Transportation Needs (04/28/2022)   PRAPARE - Hydrologist (Medical): No    Lack of Transportation (Non-Medical): No  Physical Activity: Not on file  Stress: Not on file  Social Connections: Not on file  Intimate Partner Violence: Not  At Risk (04/28/2022)   Humiliation, Afraid, Rape, and Kick questionnaire    Fear of Current or Ex-Partner: No    Emotionally Abused: No    Physically Abused: No    Sexually Abused: No   Family History  Problem Relation Age of Onset   Liver disease Mother    Hypertension Father    BP 122/88   Pulse 77   Wt 87.6 kg (193 lb 3.2 oz)   SpO2 99%   BMI 27.33 kg/m   Wt Readings from Last 3 Encounters:  06/29/22 87.6 kg (193 lb 3.2 oz)  04/28/22 78 kg (172 lb)  04/21/22 78 kg (172 lb)   PHYSICAL EXAM: General:  Well appearing. No resp difficulty HEENT: normal Neck: supple. no JVD. Carotids 2+ bilat; no bruits. No lymphadenopathy or thryomegaly appreciated. Cor: PMI nondisplaced. Regular rate & rhythm. No rubs, gallops or murmurs. Lungs: clear Abdomen: soft, nontender, nondistended. No hepatosplenomegaly. No bruits or masses. Good bowel sounds. Extremities: no cyanosis, clubbing, rash, edema Neuro: alert & orientedx3, cranial nerves grossly intact x for blindness. moves all 4 extremities w/o difficulty. Affect pleasant  ASSESSMENT & PLAN:  HFrEF, NICM - Echo (1/23): EF < 20% RV normal - cMRI (1/23): EF 22% RV normal, and no LGE to suggest scar.  - Cath (1/23): with mod CAD. Does not explain reduced EF. ? PVCs.  - Echo (3/23): EF 20-25%.  - Echo (7/23): EF 20-25%, RV ok - Echo 11/23 30-35%  - Echo 06/29/22 EF 30-35% RV ok Personally reviewed - Stable NYHA I-II, volume ok GDMT has been limited by hypotension - He does not need loop diuretics.  - Continue Coreg 3.125 mg bid. -  Increase losartan to 25 mg qhs. - Continue Jardiance 10 mg daily.  - Continue spironolactone 12.5 mg daily.  - Labs today. - EF has not normalized with PVC suppression. Etiology of LV dysfunction remains unclear. Continue aggressive GDMT. Can consider genetic testing but FHx not particularly concerning - Not interested in ICD  2. PVC - Zio (1/23) 9% PVCs  - Repeat zio (9/23) showed 1.1% PVC burden, mostly NSR (only wore for 1 day) - Continue mexilitene 300 mg bid. - We discussed sleep study, he declined.  3. CAD - LHC (04/2021) with moderate non-obs CAD - No s/s angina - On ASA, declines starting statin. He is concerned about side effects.  4. Legal blindness - stable.  Glori Bickers, MD  12:46 PM

## 2022-06-29 NOTE — Progress Notes (Signed)
*  PRELIMINARY RESULTS* Echocardiogram 2D Echocardiogram has been performed.  Gary Ferguson 06/29/2022, 11:35 AM

## 2022-06-29 NOTE — Patient Instructions (Signed)
There has been no changes to your medications   Labs done today, your results will be available in MyChart, we will contact you for abnormal readings.  Your physician recommends that you schedule a follow-up appointment in: 6 months (September) ** please call the office in July to arrange your follow up appointment. **  If you have any questions or concerns before your next appointment please send us a message through mychart or call our office at [phone].    TO LEAVE A MESSAGE FOR THE NURSE SELECT OPTION 2, PLEASE LEAVE A MESSAGE INCLUDING: YOUR NAME DATE OF BIRTH CALL BACK NUMBER REASON FOR CALL**this is important as we prioritize the call backs  YOU WILL RECEIVE A CALL BACK THE SAME DAY AS LONG AS YOU CALL BEFORE 4:00 PM  At the Advanced Heart Failure Clinic, you and your health needs are our priority. As part of our continuing mission to provide you with exceptional heart care, we have created designated Provider Care Teams. These Care Teams include your primary Cardiologist (physician) and Advanced Practice Providers (APPs- Physician Assistants and Nurse Practitioners) who all work together to provide you with the care you need, when you need it.   You may see any of the following providers on your designated Care Team at your next follow up: Dr Daniel Bensimhon Dr Dalton McLean Dr. Aditya Sabharwal Amy Clegg, NP Brittainy Simmons, PA Jessica Milford,NP Lindsay Finch, PA Alma Diaz, NP Lauren Kemp, PharmD   Please be sure to bring in all your medications bottles to every appointment.    Thank you for choosing [facility]     

## 2022-07-01 ENCOUNTER — Other Ambulatory Visit (HOSPITAL_COMMUNITY): Payer: Self-pay

## 2022-07-01 MED ORDER — LOSARTAN POTASSIUM 25 MG PO TABS: 25.0000 mg | ORAL_TABLET | Freq: Every day | ORAL | 3 refills | Status: DC

## 2022-07-12 DIAGNOSIS — Z6828 Body mass index (BMI) 28.0-28.9, adult: Secondary | ICD-10-CM | POA: Diagnosis not present

## 2022-07-12 DIAGNOSIS — Z79899 Other long term (current) drug therapy: Secondary | ICD-10-CM | POA: Diagnosis not present

## 2022-07-12 DIAGNOSIS — Z0001 Encounter for general adult medical examination with abnormal findings: Secondary | ICD-10-CM | POA: Diagnosis not present

## 2022-07-12 DIAGNOSIS — N1831 Chronic kidney disease, stage 3a: Secondary | ICD-10-CM | POA: Diagnosis not present

## 2022-07-12 DIAGNOSIS — D518 Other vitamin B12 deficiency anemias: Secondary | ICD-10-CM | POA: Diagnosis not present

## 2022-07-12 DIAGNOSIS — E538 Deficiency of other specified B group vitamins: Secondary | ICD-10-CM | POA: Diagnosis not present

## 2022-07-12 DIAGNOSIS — Z125 Encounter for screening for malignant neoplasm of prostate: Secondary | ICD-10-CM | POA: Diagnosis not present

## 2022-07-12 DIAGNOSIS — E7849 Other hyperlipidemia: Secondary | ICD-10-CM | POA: Diagnosis not present

## 2022-07-12 DIAGNOSIS — G9332 Myalgic encephalomyelitis/chronic fatigue syndrome: Secondary | ICD-10-CM | POA: Diagnosis not present

## 2022-07-12 DIAGNOSIS — E782 Mixed hyperlipidemia: Secondary | ICD-10-CM | POA: Diagnosis not present

## 2022-07-12 DIAGNOSIS — E559 Vitamin D deficiency, unspecified: Secondary | ICD-10-CM | POA: Diagnosis not present

## 2022-07-12 DIAGNOSIS — M1991 Primary osteoarthritis, unspecified site: Secondary | ICD-10-CM | POA: Diagnosis not present

## 2022-07-12 DIAGNOSIS — M8668 Other chronic osteomyelitis, other site: Secondary | ICD-10-CM | POA: Diagnosis not present

## 2022-07-12 DIAGNOSIS — E6609 Other obesity due to excess calories: Secondary | ICD-10-CM | POA: Diagnosis not present

## 2022-07-12 DIAGNOSIS — Z1331 Encounter for screening for depression: Secondary | ICD-10-CM | POA: Diagnosis not present

## 2022-07-13 DIAGNOSIS — E663 Overweight: Secondary | ICD-10-CM | POA: Diagnosis not present

## 2022-07-13 DIAGNOSIS — K219 Gastro-esophageal reflux disease without esophagitis: Secondary | ICD-10-CM | POA: Diagnosis not present

## 2022-07-13 DIAGNOSIS — Z008 Encounter for other general examination: Secondary | ICD-10-CM | POA: Diagnosis not present

## 2022-07-13 DIAGNOSIS — Z6826 Body mass index (BMI) 26.0-26.9, adult: Secondary | ICD-10-CM | POA: Diagnosis not present

## 2022-07-13 DIAGNOSIS — E261 Secondary hyperaldosteronism: Secondary | ICD-10-CM | POA: Diagnosis not present

## 2022-07-13 DIAGNOSIS — I5020 Unspecified systolic (congestive) heart failure: Secondary | ICD-10-CM | POA: Diagnosis not present

## 2022-10-26 DIAGNOSIS — Z471 Aftercare following joint replacement surgery: Secondary | ICD-10-CM | POA: Diagnosis not present

## 2022-10-26 DIAGNOSIS — Z96652 Presence of left artificial knee joint: Secondary | ICD-10-CM | POA: Diagnosis not present

## 2022-10-27 ENCOUNTER — Other Ambulatory Visit (HOSPITAL_COMMUNITY): Payer: Self-pay | Admitting: Family Medicine

## 2022-10-29 DIAGNOSIS — I83003 Varicose veins of unspecified lower extremity with ulcer of ankle: Secondary | ICD-10-CM | POA: Diagnosis not present

## 2022-10-29 DIAGNOSIS — S90511A Abrasion, right ankle, initial encounter: Secondary | ICD-10-CM | POA: Diagnosis not present

## 2022-10-29 DIAGNOSIS — E663 Overweight: Secondary | ICD-10-CM | POA: Diagnosis not present

## 2022-10-29 DIAGNOSIS — N1831 Chronic kidney disease, stage 3a: Secondary | ICD-10-CM | POA: Diagnosis not present

## 2022-10-29 DIAGNOSIS — Z6827 Body mass index (BMI) 27.0-27.9, adult: Secondary | ICD-10-CM | POA: Diagnosis not present

## 2022-11-04 ENCOUNTER — Other Ambulatory Visit (HOSPITAL_COMMUNITY): Payer: Self-pay | Admitting: Family Medicine

## 2022-11-06 ENCOUNTER — Other Ambulatory Visit (HOSPITAL_COMMUNITY): Payer: Self-pay | Admitting: Family Medicine

## 2022-12-24 ENCOUNTER — Other Ambulatory Visit (HOSPITAL_COMMUNITY): Payer: Self-pay | Admitting: Family Medicine

## 2023-02-01 ENCOUNTER — Ambulatory Visit (HOSPITAL_COMMUNITY)
Admission: RE | Admit: 2023-02-01 | Discharge: 2023-02-01 | Disposition: A | Discharge status: Home / Self Care | Payer: No Typology Code available for payment source | Source: Ambulatory Visit | Attending: Internal Medicine | Admitting: Internal Medicine

## 2023-02-01 ENCOUNTER — Ambulatory Visit (HOSPITAL_COMMUNITY): Payer: No Typology Code available for payment source

## 2023-02-01 ENCOUNTER — Encounter (HOSPITAL_COMMUNITY): Payer: Self-pay | Admitting: Internal Medicine

## 2023-02-01 VITALS — BP 132/84 | HR 61 | Wt 184.0 lb

## 2023-02-01 DIAGNOSIS — I5022 Chronic systolic (congestive) heart failure: Secondary | ICD-10-CM | POA: Diagnosis not present

## 2023-02-01 DIAGNOSIS — I2510 Atherosclerotic heart disease of native coronary artery without angina pectoris: Secondary | ICD-10-CM | POA: Diagnosis not present

## 2023-02-01 DIAGNOSIS — Z86718 Personal history of other venous thrombosis and embolism: Secondary | ICD-10-CM | POA: Insufficient documentation

## 2023-02-01 DIAGNOSIS — H548 Legal blindness, as defined in USA: Secondary | ICD-10-CM | POA: Insufficient documentation

## 2023-02-01 DIAGNOSIS — I493 Ventricular premature depolarization: Secondary | ICD-10-CM | POA: Insufficient documentation

## 2023-02-01 DIAGNOSIS — I428 Other cardiomyopathies: Secondary | ICD-10-CM | POA: Diagnosis not present

## 2023-02-01 MED ORDER — SPIRONOLACTONE 25 MG PO TABS: 25.0000 mg | ORAL_TABLET | Freq: Every day | ORAL | 3 refills | Status: DC

## 2023-02-01 NOTE — Progress Notes (Addendum)
Advanced Heart Failure Clinic Note   Primary Care: Dr Sherwood Gambler Primary Cardiologist: Dr Eden Emms  HF Cardiologist: Dr Gala Romney.   HPI: Gary Ferguson is a 67 y.o.with history of blindness, DVT with IVC filter, TKR , and recently diagnosed HFrEF 04/2021.    Had knee replacement 02/19/21. Post op course uncomplicated.  Completed PT 2 weeks early. 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    Admitted 04/11/20 with increased dyspnea. ECHO with reduced EF < 20%. Transferred to [facility] for HF management/cath. Diuresed with IV lasix.  Cath with mod non obstructive cad and normal filling pressures/preserved CO. CMRI with EF < 22% RV normal and now LGE to suggest scar. GDMT limited due to soft BP. D/C weight 197 pounds.   Saw Dr Gala Romney on 05/15/21. Had high PVC burden so mexiletine was started. Had cough so lisinopril was switched to losartan.   Echo 3/23 with EF 20-25%, RV ok  Echo 10/27/21 EF 20-25% RV normal. Repeat Zio placed to ensure PVCs adequately suppressed with plans to repeat echo, then refer for ICD if EF remains < 35%.  Zio (9/23) showed 1% PVC burden, mostly NSR.    Echo 02/17/22 30-35%  Echo 06/29/22 EF 30-35% RV ok Personally reviewed  Here for regular f/u. Denies CP or SOB. No edema, orthopnea or PND. 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 SBP typically ~115   Cardiac Testing   - Zio (9/23): mostly NSR, 1% PVC burden  - Echo (7/23): EF 20-25%, RV ok  - Echo (3/23): EF 20-25% RV normal   - Zio (1/23): mostly NSR, 5 runs NSVT, frequent PVCs (9% burden)  - Echo (1/23): EF < 20%  - CMRI (1/23): LVEF < 22%. Normal RV. No LGE  to suggest scar.    - Cath (1/23):  1st [name] lesion is 30% stenosed.   1st Diag lesion is 70% stenosed.   Prox LAD to Mid LAD lesion is 40% stenosed.   Prox RCA to Mid RCA lesion is 30% stenosed.   The left ventricular  ejection fraction is less than 25% by visual estimate.   Ao = 85/60 (70) LV = 88/19 RA = 3 RV = 26/4 PA = 29/11 (20) PCW = 16 Fick cardiac output/index = 6.6/3.2 PVR = 0.6 WU SVR = 810 FA sat = 97% PA sat = 72%, 74% SVC sat = 73%    1. Moderate non-obstructive CAD 2. Severe NICM EF < 20% 3. Normal filling pressures and cardiac output  Past Medical History:  Diagnosis Date   Arthritis    CHF (congestive heart failure) (HCC)    Chronic back pain    Complication of anesthesia    Patient said that after surgery his throat closed up in 2011   GERD (gastroesophageal reflux disease)    History of DVT of lower extremity    History of kidney stones    Hx of blood clots 2010   Macular atrophy, retinal    bilateral   PONV (postoperative nausea and vomiting)    Torn rotator cuff    right shoulder   Current Outpatient Medications  Medication Sig Dispense Refill   acetaminophen (TYLENOL) 500 MG tablet Take 1,000 mg by mouth every 6 (six) hours as needed for moderate pain.     ASPIRIN 81 PO Take 81 mg by mouth daily.     carvedilol (COREG)  3.125 MG tablet TAKE ONE TABLET (3.125MG  TOTAL) BY MOUTHTWO TIMES DAILY WITH A MEAL 60 tablet 6   ciprofloxacin (CIPRO) 500 MG tablet Take 500 mg by mouth 2 (two) times daily.     clotrimazole-betamethasone (LOTRISONE) cream Apply 1 application topically 2 (two) times daily.     diphenhydramine-acetaminophen (TYLENOL PM) 25-500 MG TABS tablet Take 2 tablets by mouth at bedtime.     fexofenadine (ALLEGRA) 60 MG tablet Take 60 mg by mouth daily as needed for allergies.     JARDIANCE 10 MG TABS tablet TAKE ONE TABLET (10MG  TOTAL) BY MOUTH DAILY BEFORE BREAKFAST 90 tablet 3   losartan (COZAAR) 25 MG tablet Take 1 tablet (25 mg total) by mouth daily. 90 tablet 3   mexiletine (MEXITIL) 150 MG capsule TAKE TWO CAPSULES (300MG  TOTAL) BY MOUTHTWO TIMES DAILY 180 capsule 2   Multiple Vitamin (MULTIVITAMIN) capsule Take 1 capsule by mouth daily.     Omega 3  1000 MG CAPS Take 1,000 mg by mouth daily.     omeprazole (PRILOSEC) 20 MG capsule Take 20 mg by mouth every morning.      ondansetron (ZOFRAN) 4 MG tablet Take 1 tablet (4 mg total) by mouth every 8 (eight) hours as needed for nausea or vomiting. 30 tablet 0   spironolactone (ALDACTONE) 25 MG tablet TAKE ONE-HALF TABLET (12.5MG  TOTAL) BY MOUTH AT BEDTIME 45 tablet 3   No current facility-administered medications for this encounter.   Allergies  Allergen Reactions   Penicillins Other (See Comments)    REACTION UNSPECIFIED  Has patient had a PCN reaction causing immediate rash, facial/tongue/throat swelling, SOB or lightheadedness with hypotension: unknown childhood allergy Has patient had a PCN reaction causing severe rash involving mucus membranes or skin necrosis: unsure Has patient had a PCN reaction that required hospitalization unsure Has patient had a PCN reaction occurring within the last 10 years: No If all of the above answers are "NO", then may proceed with Cephalosporin use.    Social History   Socioeconomic History   Marital status: Divorced    Spouse name: Not on file   Number of children: 3   Years of education: Not on file   Highest education level: Some college, no degree  Occupational History   Occupation: semi-retired    Comment: produce farmer  Tobacco Use   Smoking status: Never   Smokeless tobacco: Never  Vaping Use   Vaping status: Never Used  Substance and Sexual Activity   Alcohol use: No   Drug use: Never   Sexual activity: Not on file  Other Topics Concern   Not on file  Social History Narrative   Not on file   Social Determinants of Health   Financial Resource Strain: Low Risk  (04/13/2021)   Overall Financial Resource Strain (CARDIA)    Difficulty of Paying Living Expenses: Not hard at all  Food Insecurity: No Food Insecurity (04/28/2022)   Hunger Vital Sign    Worried About Running Out of Food in the Last Year: Never true    Ran Out of Food  in the Last Year: Never true  Transportation Needs: No Transportation Needs (04/28/2022)   PRAPARE - Administrator, Civil Service (Medical): No    Lack of Transportation (Non-Medical): No  Physical Activity: Not on file  Stress: Not on file  Social Connections: Not on file  Intimate Partner Violence: Not At Risk (04/28/2022)   Humiliation, Afraid, Rape, and Kick questionnaire    Fear of  Current or Ex-Partner: No    Emotionally Abused: No    Physically Abused: No    Sexually Abused: No   Family History  Problem Relation Age of Onset   Liver disease Mother    Hypertension Father    BP 132/84   Pulse 61   Wt 83.5 kg (184 lb)   SpO2 98%   BMI 26.03 kg/m   Wt Readings from Last 3 Encounters:  02/01/23 83.5 kg (184 lb)  06/29/22 87.6 kg (193 lb 3.2 oz)  04/28/22 78 kg (172 lb)   PHYSICAL EXAM: General:  Well appearing. No resp difficulty HEENT: normal Neck: supple. no JVD. Carotids 2+ bilat; no bruits. No lymphadenopathy or thryomegaly appreciated. Cor: PMI nondisplaced. Regular rate & rhythm. No rubs, gallops or murmurs. Lungs: clear Abdomen: soft, nontender, nondistended. No hepatosplenomegaly. No bruits or masses. Good bowel sounds. Extremities: no cyanosis, clubbing, rash, edema Neuro: alert & orientedx3, cranial nerves grossly intact x for legal blindess moves all 4 extremities w/o difficulty. Affect pleasant  ECG: Sinus brady 53 No PVCs Personally reviewed  ASSESSMENT & PLAN:  HFrEF, NICM - Echo (1/23): EF < 20% RV normal - cMRI (1/23): EF 22% RV normal, and no LGE to suggest scar.  - Cath (1/23): with mod CAD. Does not explain reduced EF. ? PVCs.  - Echo (3/23): EF 20-25%.  - Echo (7/23): EF 20-25%, RV ok - Echo 11/23 30-35%  - Echo 06/29/22 EF 30-35% RV ok  - POCUS echo today (done personally) EF ~40% - Stable NYHA I Volume ok  - He does not need loop diuretics.  - Continue Coreg 3.125 mg bid. - Continue losartan 25 mg qhs. - Continue Jardiance 10  mg daily.  - Increase spironolactone to 25 mg daily.  - Not interested in ICD (and doesn't qualify with NYHA I)  2. PVC - Zio (1/23) 9% PVCs  - Repeat zio (9/23) showed 1.1% PVC burden, mostly NSR (only wore for 1 day) - Continue mexilitene 300 mg bid. - No PVCs on ECG today - Has declined sleep study  3. CAD - LHC (04/2021) with moderate non-obs CAD - No s/s angina - On ASA, declines starting statin due to concern over side effects - Followed by Dr. Eden Emms  4. Legal blindness - stable.  Arvilla Meres, MD  3:47 PM

## 2023-02-01 NOTE — Patient Instructions (Addendum)
INCREASE Spironolactone to 25 mg daily.  Blood work in 2 weeks at [employer] physician has requested that you have an echocardiogram. Echocardiography is a painless test that uses sound waves to create images of your heart. It provides your doctor with information about the size and shape of your heart and how well your heart's chambers and valves are working. This procedure takes approximately one hour. There are no restrictions for this procedure. Please do NOT wear cologne, perfume, aftershave, or lotions (deodorant is allowed). Please arrive 15 minutes prior to your appointment time.  Your physician recommends that you schedule a follow-up appointment in: 6 months with an echocardiogram ( April 2025) ** PLEASE CALL THE OFFICE IN [place] TO ARRANGE YOUR FOLLOW UP APPOINTMENT. **   If you have any questions or concerns before your next appointment please send [place] a message through [place] or call our office at [phone].    TO LEAVE A MESSAGE FOR THE NURSE SELECT OPTION 2, PLEASE LEAVE A MESSAGE INCLUDING: YOUR NAME DATE OF BIRTH CALL BACK NUMBER REASON FOR CALL**this is important as we prioritize the call backs  YOU WILL RECEIVE A CALL BACK THE SAME DAY AS LONG AS YOU CALL BEFORE 4:00 PM At the Advanced Heart Failure Clinic, you and your health needs are our priority. As part of our continuing mission to provide you with exceptional heart care, we have created designated Provider Care Teams. These Care Teams include your primary Cardiologist (physician) and Advanced Practice Providers (APPs- Physician Assistants and Nurse Practitioners) who all work together to provide you with the care you need, when you need it.   You may see any of the following providers on your designated Care Team at your next follow up: Dr Arvilla Meres Dr Marca Ancona Dr. Dorthula Nettles Dr. Clearnce Hasten Amy Filbert Schilder, NP Robbie Lis, [place] [facility] [place], [place] Brynda Peon,  NP [place] Lee, NP Karle Plumber, PharmD   Please be sure to bring in all your medications bottles to every appointment.    Thank you for choosing [facility]

## 2023-02-04 ENCOUNTER — Other Ambulatory Visit (HOSPITAL_COMMUNITY): Payer: Self-pay

## 2023-02-04 MED ORDER — SPIRONOLACTONE 25 MG PO TABS: 25.0000 mg | ORAL_TABLET | Freq: Every day | ORAL | 3 refills | Status: DC

## 2023-02-15 ENCOUNTER — Other Ambulatory Visit (HOSPITAL_COMMUNITY)
Admission: RE | Admit: 2023-02-15 | Discharge: 2023-02-15 | Disposition: A | Discharge status: Home / Self Care | Payer: No Typology Code available for payment source | Source: Ambulatory Visit | Attending: Internal Medicine | Admitting: Internal Medicine

## 2023-02-15 DIAGNOSIS — I5022 Chronic systolic (congestive) heart failure: Secondary | ICD-10-CM | POA: Insufficient documentation

## 2023-02-15 LAB — BASIC METABOLIC PANEL
Anion gap: 6 (ref 5–15)
BUN: 25 mg/dL — ABNORMAL HIGH (ref 8–23)
CO2: 24 mmol/L (ref 22–32)
Calcium: 8.8 mg/dL — ABNORMAL LOW (ref 8.9–10.3)
Chloride: 108 mmol/L (ref 98–111)
Creatinine, Ser: 0.94 mg/dL (ref 0.61–1.24)
GFR, Estimated: >60 mL/min (ref >60)
Glucose, Bld: 101 mg/dL — ABNORMAL HIGH (ref 70–99)
Potassium: 3.9 mmol/L (ref 3.5–5.1)
Sodium: 138 mmol/L (ref 135–145)

## 2023-02-15 LAB — BRAIN NATRIURETIC PEPTIDE: B Natriuretic Peptide: 25 pg/mL (ref 0.0–100.0)

## 2023-02-23 DIAGNOSIS — E261 Secondary hyperaldosteronism: Secondary | ICD-10-CM | POA: Diagnosis not present

## 2023-02-23 DIAGNOSIS — E663 Overweight: Secondary | ICD-10-CM | POA: Diagnosis not present

## 2023-02-23 DIAGNOSIS — E785 Hyperlipidemia, unspecified: Secondary | ICD-10-CM | POA: Diagnosis not present

## 2023-02-23 DIAGNOSIS — Z008 Encounter for other general examination: Secondary | ICD-10-CM | POA: Diagnosis not present

## 2023-02-23 DIAGNOSIS — I509 Heart failure, unspecified: Secondary | ICD-10-CM | POA: Diagnosis not present

## 2023-02-23 DIAGNOSIS — Z6825 Body mass index (BMI) 25.0-25.9, adult: Secondary | ICD-10-CM | POA: Diagnosis not present

## 2023-03-16 ENCOUNTER — Other Ambulatory Visit (HOSPITAL_COMMUNITY): Payer: Self-pay | Admitting: Family Medicine

## 2023-04-04 DIAGNOSIS — H524 Presbyopia: Secondary | ICD-10-CM | POA: Diagnosis not present

## 2023-05-02 DIAGNOSIS — Z96653 Presence of artificial knee joint, bilateral: Secondary | ICD-10-CM | POA: Diagnosis not present

## 2023-05-02 DIAGNOSIS — Z96652 Presence of left artificial knee joint: Secondary | ICD-10-CM | POA: Diagnosis not present

## 2023-05-02 DIAGNOSIS — Z96651 Presence of right artificial knee joint: Secondary | ICD-10-CM | POA: Diagnosis not present

## 2023-05-11 ENCOUNTER — Other Ambulatory Visit (HOSPITAL_COMMUNITY): Payer: Self-pay

## 2023-05-11 ENCOUNTER — Telehealth (HOSPITAL_COMMUNITY): Payer: Self-pay | Admitting: Pharmacy Technician

## 2023-05-11 NOTE — Telephone Encounter (Signed)
 Advanced Heart Failure Patient Advocate Encounter  Received notice that patients insurance is no longer going to pay for Mexiletine. Looks like his current prior authorization ends 06/11/23. Currently the medication is a refill too soon until 05/23/23. Its too soon to submit PA at this time. Have made note to follow up closer to time.   Almarie JULIANNA Pa, CPhT

## 2023-06-04 ENCOUNTER — Other Ambulatory Visit (HOSPITAL_COMMUNITY): Payer: Self-pay | Admitting: Internal Medicine

## 2023-06-08 ENCOUNTER — Telehealth (HOSPITAL_COMMUNITY): Payer: Self-pay | Admitting: Pharmacist

## 2023-06-08 NOTE — Telephone Encounter (Signed)
 Advanced Heart Failure Patient Advocate Encounter  Prior Authorization for Mexiletine has been approved.     Effective dates:  03/10/2023 - 06/07/2024  Karle Plumber, PharmD, BCPS, BCCP, CPP Heart Failure Clinic Pharmacist [phone]

## 2023-06-08 NOTE — Telephone Encounter (Signed)
 Patient Advocate Encounter   Received notification from [employer] that prior authorization for mexiletine is required.   PA submitted on CoverMyMeds Key [id number] Status is pending   Will continue to follow.   Karle Plumber, PharmD, BCPS, BCCP, CPP Heart Failure Clinic Pharmacist [phone]

## 2023-07-13 DIAGNOSIS — E663 Overweight: Secondary | ICD-10-CM | POA: Diagnosis not present

## 2023-07-13 DIAGNOSIS — Z0001 Encounter for general adult medical examination with abnormal findings: Secondary | ICD-10-CM | POA: Diagnosis not present

## 2023-07-13 DIAGNOSIS — Z6828 Body mass index (BMI) 28.0-28.9, adult: Secondary | ICD-10-CM | POA: Diagnosis not present

## 2023-07-13 DIAGNOSIS — E039 Hypothyroidism, unspecified: Secondary | ICD-10-CM | POA: Diagnosis not present

## 2023-07-13 DIAGNOSIS — R051 Acute cough: Secondary | ICD-10-CM | POA: Diagnosis not present

## 2023-07-13 DIAGNOSIS — N182 Chronic kidney disease, stage 2 (mild): Secondary | ICD-10-CM | POA: Diagnosis not present

## 2023-07-13 DIAGNOSIS — M1991 Primary osteoarthritis, unspecified site: Secondary | ICD-10-CM | POA: Diagnosis not present

## 2023-07-13 DIAGNOSIS — E782 Mixed hyperlipidemia: Secondary | ICD-10-CM | POA: Diagnosis not present

## 2023-07-13 DIAGNOSIS — K219 Gastro-esophageal reflux disease without esophagitis: Secondary | ICD-10-CM | POA: Diagnosis not present

## 2023-07-13 DIAGNOSIS — N1831 Chronic kidney disease, stage 3a: Secondary | ICD-10-CM | POA: Diagnosis not present

## 2023-07-13 DIAGNOSIS — Z1331 Encounter for screening for depression: Secondary | ICD-10-CM | POA: Diagnosis not present

## 2023-07-13 DIAGNOSIS — Z9229 Personal history of other drug therapy: Secondary | ICD-10-CM | POA: Diagnosis not present

## 2023-07-13 DIAGNOSIS — Z125 Encounter for screening for malignant neoplasm of prostate: Secondary | ICD-10-CM | POA: Diagnosis not present

## 2023-07-15 ENCOUNTER — Ambulatory Visit (HOSPITAL_COMMUNITY)
Admission: RE | Admit: 2023-07-15 | Discharge: 2023-07-15 | Disposition: A | Discharge status: Home / Self Care | Source: Ambulatory Visit | Attending: Internal Medicine | Admitting: Internal Medicine

## 2023-07-15 ENCOUNTER — Other Ambulatory Visit (HOSPITAL_COMMUNITY): Payer: Self-pay | Admitting: Internal Medicine

## 2023-07-15 DIAGNOSIS — R051 Acute cough: Secondary | ICD-10-CM

## 2023-07-15 DIAGNOSIS — Z981 Arthrodesis status: Secondary | ICD-10-CM | POA: Diagnosis not present

## 2023-08-02 ENCOUNTER — Ambulatory Visit (HOSPITAL_BASED_OUTPATIENT_CLINIC_OR_DEPARTMENT_OTHER)
Admission: RE | Admit: 2023-08-02 | Discharge: 2023-08-02 | Disposition: A | Discharge status: Home / Self Care | Source: Ambulatory Visit | Attending: Internal Medicine | Admitting: Internal Medicine

## 2023-08-02 ENCOUNTER — Ambulatory Visit (HOSPITAL_COMMUNITY)
Admission: RE | Admit: 2023-08-02 | Discharge: 2023-08-02 | Disposition: A | Discharge status: Home / Self Care | Source: Ambulatory Visit | Attending: Internal Medicine | Admitting: Internal Medicine

## 2023-08-02 ENCOUNTER — Encounter (HOSPITAL_COMMUNITY): Payer: Self-pay | Admitting: Internal Medicine

## 2023-08-02 VITALS — BP 110/78 | HR 56

## 2023-08-02 DIAGNOSIS — H548 Legal blindness, as defined in USA: Secondary | ICD-10-CM | POA: Diagnosis not present

## 2023-08-02 DIAGNOSIS — Z7982 Long term (current) use of aspirin: Secondary | ICD-10-CM | POA: Diagnosis not present

## 2023-08-02 DIAGNOSIS — I5022 Chronic systolic (congestive) heart failure: Secondary | ICD-10-CM

## 2023-08-02 DIAGNOSIS — Z7984 Long term (current) use of oral hypoglycemic drugs: Secondary | ICD-10-CM | POA: Insufficient documentation

## 2023-08-02 DIAGNOSIS — Z86718 Personal history of other venous thrombosis and embolism: Secondary | ICD-10-CM | POA: Insufficient documentation

## 2023-08-02 DIAGNOSIS — I2510 Atherosclerotic heart disease of native coronary artery without angina pectoris: Secondary | ICD-10-CM | POA: Diagnosis not present

## 2023-08-02 DIAGNOSIS — Z79899 Other long term (current) drug therapy: Secondary | ICD-10-CM | POA: Diagnosis not present

## 2023-08-02 DIAGNOSIS — I428 Other cardiomyopathies: Secondary | ICD-10-CM | POA: Insufficient documentation

## 2023-08-02 DIAGNOSIS — I493 Ventricular premature depolarization: Secondary | ICD-10-CM | POA: Diagnosis not present

## 2023-08-02 LAB — ECHOCARDIOGRAM COMPLETE
AR max vel: 4.35 cm2
AV Area VTI: 4.48 cm2
AV Area mean vel: 5.21 cm2
AV Mean grad: 1 mmHg
AV Peak grad: 2.8 mmHg
Ao pk vel: 0.84 m/s
Area-P 1/2: 2.21 cm2
Calc EF: 48.1 %
Est EF: 45
MV VTI: 3.17 cm2
S' Lateral: 4.2 cm
Single Plane A2C EF: 47.7 %
Single Plane A4C EF: 47.7 %

## 2023-08-02 NOTE — Patient Instructions (Signed)
 Great to see you today!!!  Your physician recommends that you schedule a follow-up appointment in: 1 year (April 2026), **Please call our office in February to schedule this appointment  If you have any questions or concerns before your next appointment please send us  a message through [place] or call our office at [phone].    TO LEAVE A MESSAGE FOR THE NURSE SELECT OPTION 2, PLEASE LEAVE A MESSAGE INCLUDING: YOUR NAME DATE OF BIRTH CALL BACK NUMBER REASON FOR CALL**this is important as we prioritize the call backs  YOU WILL RECEIVE A CALL BACK THE SAME DAY AS LONG AS YOU CALL BEFORE 4:00 PM  At the Advanced Heart Failure Clinic, you and your health needs are our priority. As part of our continuing mission to provide you with exceptional heart care, we have created designated Provider Care Teams. These Care Teams include your primary Cardiologist (physician) and Advanced Practice Providers (APPs- Physician Assistants and Nurse Practitioners) who all work together to provide you with the care you need, when you need it.   You may see any of the following providers on your designated Care Team at your next follow up: Dr Jules Oar Dr Peder Bourdon Dr. Alwin Baars Dr. Arta Lark Amy Marijane Shoulders, NP Ruddy Corral, [place] [facility] [place], [place] Dennise Fitz, NP [place] Lee, NP Shawnee Dellen, NP Luster Salters, PharmD Bevely Brush, PharmD   Please be sure to bring in all your medications bottles to every appointment.    Thank you for choosing [facility]

## 2023-08-02 NOTE — Progress Notes (Signed)
  Echocardiogram 2D Echocardiogram has been performed.  Levi Crass L Crixus Mcaulay RDCS 08/02/2023, 10:27 AM

## 2023-08-02 NOTE — Progress Notes (Signed)
 Advanced Heart Failure Clinic Note   Primary Care: Dr Lewayne Records Primary Cardiologist: Dr Stann Earnest  HF Cardiologist: Dr Julane Ny.   HPI: Mr Syvertson is a 68 y.o.with history of blindness, DVT with IVC filter, TKR , and recently diagnosed HFrEF 04/2021.    Had knee replacement 02/19/21. Post op course uncomplicated.  Completed PT 2 weeks early. 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    Admitted 04/11/20 with increased dyspnea. ECHO with reduced EF < 20%. Transferred to [facility] for HF management/cath. Diuresed with IV lasix .  Cath with mod non obstructive cad and normal filling pressures/preserved CO. CMRI with EF < 22% RV normal and now LGE to suggest scar. GDMT limited due to soft BP. D/C weight 197 pounds.   Saw Dr Migdalia Olejniczak on 05/15/21. Had high PVC burden so mexiletine was started. Had cough so lisinopril  was switched to losartan .   Echo 3/23 with EF 20-25%, RV ok  Echo 10/27/21 EF 20-25% RV normal. Repeat Zio placed to ensure PVCs adequately suppressed with plans to repeat echo, then refer for ICD if EF remains < 35%.  Zio (9/23) showed 1% PVC burden, mostly NSR.    Echo 02/17/22 30-35%  Echo 06/29/22 EF 30-35% RV ok Personally reviewed  Here for regular f/u. Says he has been struggling with allergies and cough/nasal congestion. PCP did CXR which was clear. No swelling. No CP or SOB.   Echo today 08/02/23 EF 45%    Cardiac Testing   - Zio (9/23): mostly NSR, 1% PVC burden  - Echo (7/23): EF 20-25%, RV ok  - Echo (3/23): EF 20-25% RV normal   - Zio (1/23): mostly NSR, 5 runs NSVT, frequent PVCs (9% burden)  - Echo (1/23): EF < 20%  - CMRI (1/23): LVEF < 22%. Normal RV. No LGE  to suggest scar.    - Cath (1/23):  1st [name] lesion is 30% stenosed.   1st Diag lesion is 70% stenosed.   Prox LAD to Mid LAD lesion is 40% stenosed.   Prox RCA to Mid RCA lesion  is 30% stenosed.   The left ventricular ejection fraction is less than 25% by visual estimate.   Ao = 85/60 (70) LV = 88/19 RA = 3 RV = 26/4 PA = 29/11 (20) PCW = 16 Fick cardiac output/index = 6.6/3.2 PVR = 0.6 WU SVR = 810 FA sat = 97% PA sat = 72%, 74% SVC sat = 73%    1. Moderate non-obstructive CAD 2. Severe NICM EF < 20% 3. Normal filling pressures and cardiac output  Past Medical History:  Diagnosis Date   Arthritis    CHF (congestive heart failure) (HCC)    Chronic back pain    Complication of anesthesia    Patient said that after surgery his throat closed up in 2011   GERD (gastroesophageal reflux disease)    History of DVT of lower extremity    History of kidney stones    Hx of blood clots 2010   Macular atrophy, retinal    bilateral   PONV (postoperative nausea and vomiting)    Torn rotator cuff    right shoulder   Current Outpatient Medications  Medication Sig Dispense Refill   acetaminophen  (TYLENOL ) 500 MG tablet Take 1,000 mg by mouth every 6 (six) hours as needed for moderate pain.     ASPIRIN  81 PO Take 81 mg  by mouth daily.     carvedilol  (COREG ) 3.125 MG tablet TAKE ONE TABLET (3.125MG  TOTAL) BY MOUTHTWO TIMES DAILY WITH A MEAL 60 tablet 6   ciprofloxacin  (CIPRO ) 500 MG tablet Take 500 mg by mouth 2 (two) times daily.     clotrimazole -betamethasone (LOTRISONE) cream Apply 1 application topically 2 (two) times daily.     diphenhydramine -acetaminophen  (TYLENOL  PM) 25-500 MG TABS tablet Take 2 tablets by mouth as needed.     fexofenadine (ALLEGRA) 60 MG tablet Take 60 mg by mouth daily as needed for allergies.     JARDIANCE  10 MG TABS tablet TAKE ONE TABLET (10MG  TOTAL) BY MOUTH DAILY BEFORE BREAKFAST 90 tablet 3   losartan  (COZAAR ) 25 MG tablet Take 1 tablet (25 mg total) by mouth daily. 90 tablet 3   mexiletine (MEXITIL ) 150 MG capsule TAKE TWO CAPSULES (300MG  TOTAL) BY MOUTHTWO TIMES DAILY 360 capsule 3   Multiple Vitamin (MULTIVITAMIN) capsule Take  1 capsule by mouth daily.     Omega 3 1000 MG CAPS Take 1,000 mg by mouth daily.     omeprazole (PRILOSEC) 20 MG capsule Take 20 mg by mouth every morning.      spironolactone  (ALDACTONE ) 25 MG tablet Take 1 tablet (25 mg total) by mouth daily. 90 tablet 3   No current facility-administered medications for this encounter.   Allergies  Allergen Reactions   Penicillins Other (See Comments)    REACTION UNSPECIFIED  Has patient had a PCN reaction causing immediate rash, facial/tongue/throat swelling, SOB or lightheadedness with hypotension: unknown childhood allergy Has patient had a PCN reaction causing severe rash involving mucus membranes or skin necrosis: unsure Has patient had a PCN reaction that required hospitalization unsure Has patient had a PCN reaction occurring within the last 10 years: No If all of the above answers are "NO", then may proceed with Cephalosporin use.    Social History   Socioeconomic History   Marital status: Divorced    Spouse name: Not on file   Number of children: 3   Years of education: Not on file   Highest education level: Some college, no degree  Occupational History   Occupation: semi-retired    Comment: produce farmer  Tobacco Use   Smoking status: Never   Smokeless tobacco: Never  Vaping Use   Vaping status: Never Used  Substance and Sexual Activity   Alcohol  use: No   Drug use: Never   Sexual activity: Not on file  Other Topics Concern   Not on file  Social History Narrative   Not on file   Social Drivers of Health   Financial Resource Strain: Low Risk  (04/13/2021)   Overall Financial Resource Strain (CARDIA)    Difficulty of Paying Living Expenses: Not hard at all  Food Insecurity: No Food Insecurity (04/28/2022)   Hunger Vital Sign    Worried About Running Out of Food in the Last Year: Never true    Ran Out of Food in the Last Year: Never true  Transportation Needs: No Transportation Needs (04/28/2022)   PRAPARE - Therapist, art (Medical): No    Lack of Transportation (Non-Medical): No  Physical Activity: Not on file  Stress: Not on file  Social Connections: Not on file  Intimate Partner Violence: Not At Risk (04/28/2022)   Humiliation, Afraid, Rape, and Kick questionnaire    Fear of Current or Ex-Partner: No    Emotionally Abused: No    Physically Abused: No  Sexually Abused: No   Family History  Problem Relation Age of Onset   Liver disease Mother    Hypertension Father    BP 110/78   Pulse (!) 56   SpO2 99%   Wt Readings from Last 3 Encounters:  02/01/23 83.5 kg (184 lb)  06/29/22 87.6 kg (193 lb 3.2 oz)  04/28/22 78 kg (172 lb)   PHYSICAL EXAM: General:  Well appearing. No resp difficulty Blind HEENT: normal Neck: supple. no JVD. Carotids 2+ bilat; no bruits. No lymphadenopathy or thryomegaly appreciated. Cor: PMI nondisplaced. Regular rate & rhythm. No rubs, gallops or murmurs. Lungs: clear Abdomen: soft, nontender, nondistended. No hepatosplenomegaly. No bruits or masses. Good bowel sounds. Extremities: no cyanosis, clubbing, rash, edema Neuro: alert & orientedx3, cranial nerves grossly intact x for blindness. moves all 4 extremities w/o difficulty. Affect pleasant   ECG: Sinus brady 57  No PVCs Personally reviewed   ASSESSMENT & PLAN:  HFrEF, NICM - Echo (1/23): EF < 20% RV normal - cMRI (1/23): EF 22% RV normal, and no LGE to suggest scar.  - Cath (1/23): with mod CAD. Does not explain reduced EF. ? PVCs.  - Echo (3/23): EF 20-25%.  - Echo (7/23): EF 20-25%, RV ok - Echo 11/23 30-35%  - Echo 06/29/22 EF 30-35% RV ok  - Echo today 08/02/23 EF 45%  - Stable NYHA I  - Volume looks good   - Continue Coreg  3.125 mg bid. - Continue losartan  25 mg qhs. - Continue Jardiance  10 mg daily.  - Continue spiro 25 daily   2. PVC - Zio (1/23) 9% PVCs  - Repeat zio (9/23) showed 1.1% PVC burden, mostly NSR (only wore for 1 day) - Continue mexilitene 300 bid - No  PVC on ECG today   3. CAD - LHC (04/2021) with moderate non-obs CAD - No s/s angina - On ASA, declines starting statin due to concern over side effects - Followed by Dr. Stann Earnest  4. Legal blindness - stable.  Jules Oar, MD  11:27 AM

## 2023-08-02 NOTE — Addendum Note (Signed)
 Encounter addended by: Glorietta Lark, RN on: 08/02/2023 11:37 AM  Actions taken: Clinical Note Signed

## 2023-08-05 ENCOUNTER — Other Ambulatory Visit (HOSPITAL_COMMUNITY): Payer: Self-pay | Admitting: Internal Medicine

## 2023-08-31 IMAGING — MR MR CARD MORPHOLOGY WO/W CM
45 of 48 series · 45 of 48 positions shown · IV contrast (Contrast agent)
Comparison: none

CLINICAL DATA: 65M presents with heart failure. EF <20% on echo.
LHC showed moderate nonobstructive CAD.

EXAM:
CARDIAC MRI
TECHNIQUE: The patient was scanned on a 1.5 Tesla Siemens magnet. A dedicated
cardiac coil was used. Functional imaging was done using Fiesta
sequences. [DATE], and 4 chamber views were done to assess for RWMA's.
Modified Bracho rule using a short axis stack was used to
calculate an ejection fraction on a dedicated work station using
Circle software. The patient received 10 cc of Gadavist. After 10
minutes inversion recovery sequences were used to assess for
infiltration and scar tissue.
CONTRAST:  10 cc  of Gadavist

[Series 4: [id number] · axial · 8.0mm · 1.41mm/px · 1 of 16 slices shown]
[im 1/16]
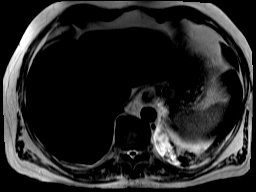

[Series 8: bSSFP · oblique · 8.0mm · 1.61mm/px · 1 of 25 slices shown (1 of 26)]
[im 1/25]
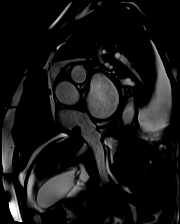

[Series 9: bSSFP · oblique · 8.0mm · 1.61mm/px · 1 of 25 slices shown (2 of 26)]
[im 1/25]
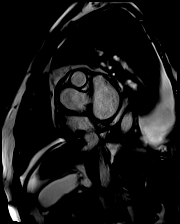

[Series 10: bSSFP · oblique · 8.0mm · 1.61mm/px · 1 of 25 slices shown (3 of 26)]
[im 1/25]
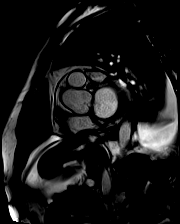

[Series 11: bSSFP · oblique · 8.0mm · 1.61mm/px · 1 of 25 slices shown (4 of 26)]
[im 1/25]
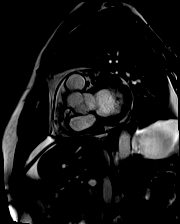

[Series 12: bSSFP · oblique · 8.0mm · 1.61mm/px · 1 of 25 slices shown (5 of 26)]
[im 1/25]
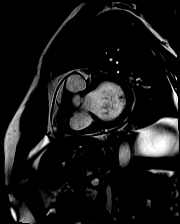

[Series 13: bSSFP · oblique · 8.0mm · 1.61mm/px · 1 of 25 slices shown (6 of 26)]
[im 1/25]
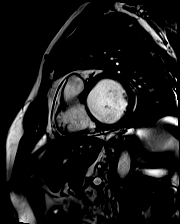

[Series 14: bSSFP · oblique · 8.0mm · 1.61mm/px · 1 of 25 slices shown (7 of 26)]
[im 1/25]
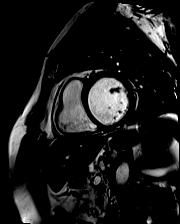

[Series 15: bSSFP · oblique · 8.0mm · 1.61mm/px · 1 of 25 slices shown (8 of 26)]
[im 1/25]
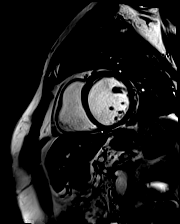

[Series 16: bSSFP · oblique · 8.0mm · 1.61mm/px · 1 of 25 slices shown (9 of 26)]
[im 1/25]
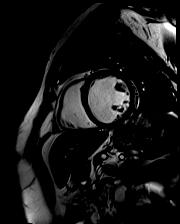

[Series 17: bSSFP · oblique · 8.0mm · 1.61mm/px · 1 of 25 slices shown (10 of 26)]
[im 1/25]
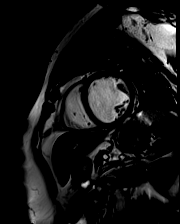

[Series 18: bSSFP · oblique · 8.0mm · 1.61mm/px · 1 of 25 slices shown (11 of 26)]
[im 1/25]
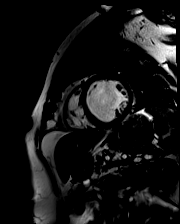

[Series 19: bSSFP · oblique · 8.0mm · 1.61mm/px · 1 of 25 slices shown (12 of 26)]
[im 1/25]
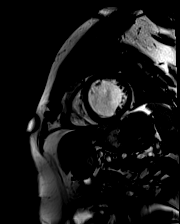

[Series 20: bSSFP · oblique · 8.0mm · 1.61mm/px · 1 of 25 slices shown (13 of 26)]
[im 1/25]
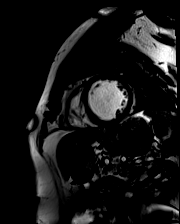

[Series 21: bSSFP · oblique · 8.0mm · 1.61mm/px · 1 of 25 slices shown (14 of 26)]
[im 1/25]
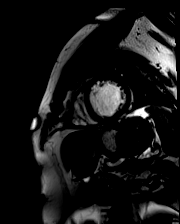

[Series 22: bSSFP · oblique · 8.0mm · 1.61mm/px · 1 of 25 slices shown (15 of 26)]
[im 1/25]
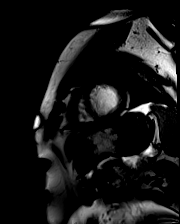

[Series 23: bSSFP · oblique · 8.0mm · 1.61mm/px · 1 of 25 slices shown (16 of 26)]
[im 1/25]
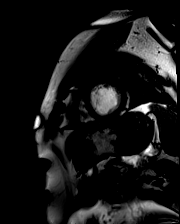

[Series 24: bSSFP · oblique · 8.0mm · 1.61mm/px · 1 of 25 slices shown (17 of 26)]
[im 1/25]
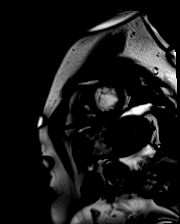

[Series 25: bSSFP · oblique · 8.0mm · 1.61mm/px · 1 of 25 slices shown (18 of 26)]
[im 1/25]
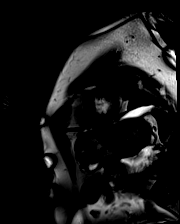

[Series 26: bSSFP · oblique · 8.0mm · 1.61mm/px · 1 of 25 slices shown (19 of 26)]
[im 1/25]
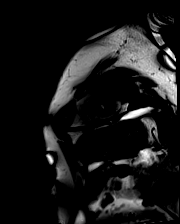

[Series 27: bSSFP · oblique · 8.0mm · 1.61mm/px · 1 of 25 slices shown (20 of 26)]
[im 1/25]
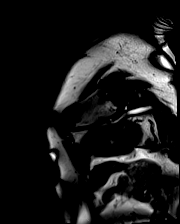

[Series 28: bSSFP · oblique · 8.0mm · 1.61mm/px · 1 of 25 slices shown (21 of 26)]
[im 1/25]
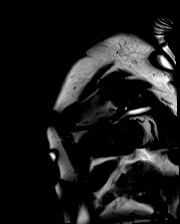

[Series 29: bSSFP · axial · 6.0mm · 1.41mm/px · 1 of 25 slices shown (22 of 26)]
[im 1/25]
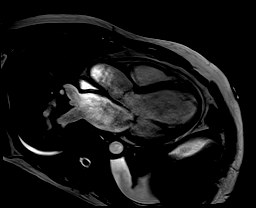

[Series 30: bSSFP · oblique · 6.0mm · 1.41mm/px · 1 of 25 slices shown (23 of 26)]
[im 1/25]
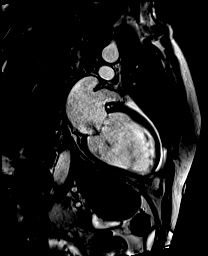

[Series 31: bSSFP · axial · 6.0mm · 1.41mm/px · 1 of 25 slices shown (24 of 26)]
[im 1/25]
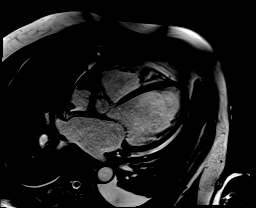

[Series 32: bSSFP · axial · 6.0mm · 1.41mm/px · 1 of 25 slices shown (25 of 26)]
[im 1/25]
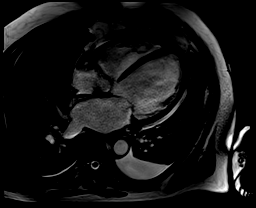

[Series 33: cine_trufi_cs_rt_short axis · oblique · 8.0mm · 1.73mm/px · 1 of 49 slices shown (1 of 18)]
[im 1/49]
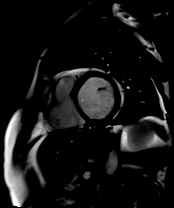

[Series 33: cine_trufi_cs_rt_short axis · oblique · 8.0mm · 1.73mm/px · 1 of 49 slices shown (2 of 18)]
[im 1/49]
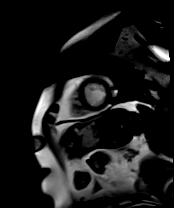

[Series 33: cine_trufi_cs_rt_short axis · oblique · 8.0mm · 1.73mm/px · 1 of 49 slices shown (3 of 18)]
[im 1/49]
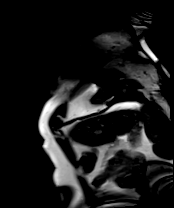

[Series 33: cine_trufi_cs_rt_short axis · oblique · 8.0mm · 1.73mm/px · 1 of 49 slices shown (4 of 18)]
[im 1/49]
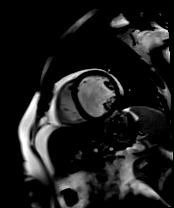

[Series 33: cine_trufi_cs_rt_short axis · oblique · 8.0mm · 1.73mm/px · 1 of 49 slices shown (5 of 18)]
[im 1/49]
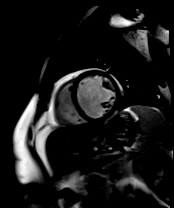

[Series 33: cine_trufi_cs_rt_short axis · oblique · 8.0mm · 1.73mm/px · 1 of 49 slices shown (6 of 18)]
[im 1/49]
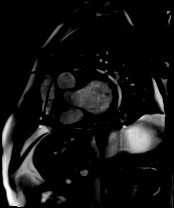

[Series 33: cine_trufi_cs_rt_short axis · oblique · 8.0mm · 1.73mm/px · 1 of 49 slices shown (7 of 18)]
[im 1/49]
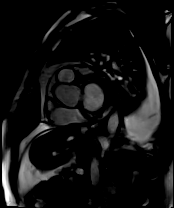

[Series 33: cine_trufi_cs_rt_short axis · oblique · 8.0mm · 1.73mm/px · 1 of 49 slices shown (8 of 18)]
[im 1/49]
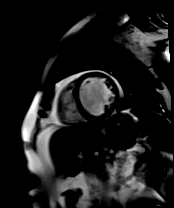

[Series 33: cine_trufi_cs_rt_short axis · oblique · 8.0mm · 1.73mm/px · 1 of 49 slices shown (9 of 18)]
[im 1/49]
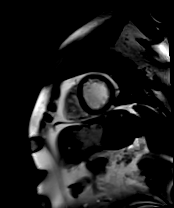

[Series 33: cine_trufi_cs_rt_short axis · oblique · 8.0mm · 1.73mm/px · 1 of 49 slices shown (10 of 18)]
[im 1/49]
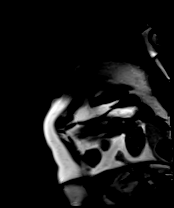

[Series 33: cine_trufi_cs_rt_short axis · oblique · 8.0mm · 1.73mm/px · 1 of 49 slices shown (11 of 18)]
[im 1/49]
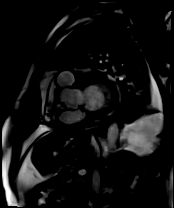

[Series 33: cine_trufi_cs_rt_short axis · oblique · 8.0mm · 1.73mm/px · 1 of 49 slices shown (12 of 18)]
[im 1/49]
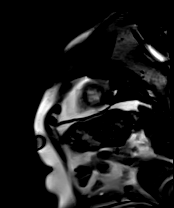

[Series 33: cine_trufi_cs_rt_short axis · oblique · 8.0mm · 1.73mm/px · 1 of 49 slices shown (13 of 18)]
[im 1/49]
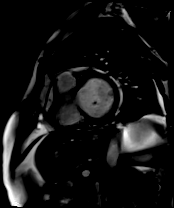

[Series 33: cine_trufi_cs_rt_short axis · oblique · 8.0mm · 1.73mm/px · 1 of 49 slices shown (14 of 18)]
[im 1/49]
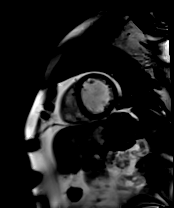

[Series 33: cine_trufi_cs_rt_short axis · oblique · 8.0mm · 1.73mm/px · 1 of 49 slices shown (15 of 18)]
[im 1/49]
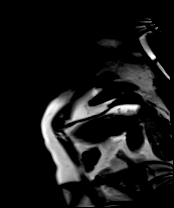

[Series 33: cine_trufi_cs_rt_short axis · oblique · 8.0mm · 1.73mm/px · 1 of 49 slices shown (16 of 18)]
[im 1/49]
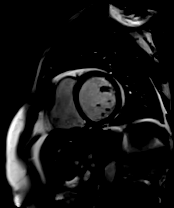

[Series 33: cine_trufi_cs_rt_short axis · oblique · 8.0mm · 1.73mm/px · 1 of 49 slices shown (17 of 18)]
[im 1/49]
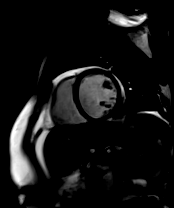

[Series 33: cine_trufi_cs_rt_short axis · oblique · 8.0mm · 1.73mm/px · 1 of 49 slices shown (18 of 18)]
[im 1/49]
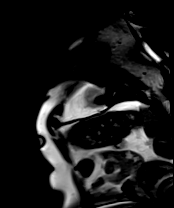

[Series 34: bSSFP · axial · 6.0mm · 1.41mm/px · 1 of 25 slices shown (26 of 26)]
[im 1/25]
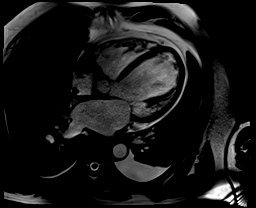

[45 of 48 positions shown; findings below may reference images not displayed]

FINDINGS: Left ventricle:

-Severe dilatation

-Severe systolic dysfunction

-Mild nonspecific ECV elevation (30%)

-No LGE

LV EF: 22% (Normal 56-78%)

Absolute volumes:

LV EDV: 326mL (Normal 77-195 mL)

LV ESV: 254mL (Normal 19-72 mL)

LV SV: 72mL (Normal 51-133 mL)

CO: 6.0L/min (Normal 2.8-8.8 L/min)

Indexed volumes:

LV EDV: 155mL/sq-m (Normal 47-92 mL/sq-m)

LV ESV: 121mL/sq-m (Normal 13-30 mL/sq-m)

LV SV: 34mL/sq-m (Normal 32-62 mL/sq-m)

CI: 2.9L/min/sq-m (Normal 1.7-4.2 L/min/sq-m)

Right ventricle: Normal RV size and systolic function

RV EF:  48% (Normal 47-74%)

Absolute volumes:

RV EDV: 173mL (Normal 88-227 mL)

RV ESV: 90mL (Normal 23-103 mL)

RV SV: 84mL (Normal 52-138 mL)

CO: 7.0L/min (Normal 2.8-8.8 L/min)

Indexed volumes:

RV EDV: 82mL/sq-m (Normal 55-105 mL/sq-m)

RV ESV: 43mL/sq-m (Normal 15-43 mL/sq-m)

RV SV: 40mL/sq-m (Normal 32-64 mL/sq-m)

CI: 3.3L/min/sq-m (Normal 1.7-4.2 L/min/sq-m)

Left atrium:Moderate enlargement

Right atrium: Normal size

Mitral valve: Mild regurgitation

Aortic valve: No regurgitation

Tricuspid valve: Trivial regurgitation

Pulmonic valve: No regurgitation

Aorta: Dilated ascending aorta measuring 40mm

Pericardium: Small effusion

Extracardiac structures: Small left pleural effusion
IMPRESSION: 1.  Severe LV dilatation with severe systolic dysfunction (EF 22%)

2.  Normal RV size and systolic function (EF 48%)

3.  No late gadolinium enhancement to suggest myocardial scar

## 2023-09-16 DIAGNOSIS — E785 Hyperlipidemia, unspecified: Secondary | ICD-10-CM | POA: Diagnosis not present

## 2023-09-16 DIAGNOSIS — I472 Ventricular tachycardia, unspecified: Secondary | ICD-10-CM | POA: Diagnosis not present

## 2023-09-16 DIAGNOSIS — I509 Heart failure, unspecified: Secondary | ICD-10-CM | POA: Diagnosis not present

## 2023-09-16 DIAGNOSIS — Z008 Encounter for other general examination: Secondary | ICD-10-CM | POA: Diagnosis not present

## 2023-09-16 DIAGNOSIS — N182 Chronic kidney disease, stage 2 (mild): Secondary | ICD-10-CM | POA: Diagnosis not present

## 2023-09-16 DIAGNOSIS — M1990 Unspecified osteoarthritis, unspecified site: Secondary | ICD-10-CM | POA: Diagnosis not present

## 2023-09-16 DIAGNOSIS — Z860100 Personal history of colon polyps, unspecified: Secondary | ICD-10-CM | POA: Diagnosis not present

## 2023-09-16 DIAGNOSIS — K219 Gastro-esophageal reflux disease without esophagitis: Secondary | ICD-10-CM | POA: Diagnosis not present

## 2023-09-16 DIAGNOSIS — I130 Hypertensive heart and chronic kidney disease with heart failure and stage 1 through stage 4 chronic kidney disease, or unspecified chronic kidney disease: Secondary | ICD-10-CM | POA: Diagnosis not present

## 2023-11-02 ENCOUNTER — Other Ambulatory Visit (HOSPITAL_COMMUNITY): Payer: Self-pay | Admitting: Internal Medicine

## 2023-12-31 ENCOUNTER — Other Ambulatory Visit: Payer: Self-pay

## 2024-01-02 ENCOUNTER — Other Ambulatory Visit: Payer: Self-pay

## 2024-01-02 NOTE — Telephone Encounter (Signed)
 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

## 2024-01-04 ENCOUNTER — Other Ambulatory Visit (HOSPITAL_COMMUNITY): Payer: Self-pay | Admitting: Internal Medicine

## 2024-01-10 DIAGNOSIS — Z008 Encounter for other general examination: Secondary | ICD-10-CM | POA: Diagnosis not present

## 2024-01-10 DIAGNOSIS — I5022 Chronic systolic (congestive) heart failure: Secondary | ICD-10-CM | POA: Diagnosis not present

## 2024-02-22 ENCOUNTER — Other Ambulatory Visit (HOSPITAL_COMMUNITY): Payer: Self-pay | Admitting: Internal Medicine

## 2024-03-15 ENCOUNTER — Other Ambulatory Visit (HOSPITAL_COMMUNITY): Payer: Self-pay | Admitting: Internal Medicine

## 2024-04-23 ENCOUNTER — Ambulatory Visit: Payer: Self-pay

## 2024-04-23 VITALS — BP 107/70 | HR 66 | Ht 70.0 in | Wt 193.0 lb

## 2024-04-23 DIAGNOSIS — M79622 Pain in left upper arm: Secondary | ICD-10-CM | POA: Diagnosis not present

## 2024-04-23 DIAGNOSIS — I10 Essential (primary) hypertension: Secondary | ICD-10-CM | POA: Diagnosis not present

## 2024-04-23 NOTE — Progress Notes (Unsigned)
 "  New Patient Office Visit  Subjective    Patient ID: Gary Ferguson, male    DOB: [birth year]  Age: 69 y.o. MRN: [id number]  CC:  Chief Complaint  Patient presents with   Establish Care    Pt has a pain in lower bicep and forearm in muscle, started about a week ago     HPI Gary Ferguson presents to establish care Discussed the use of AI scribe software for clinical note transcription with the patient, who gave verbal consent to proceed.  History of Present Illness    Gary Ferguson is a 69 year old male who presents with intermittent arm pain. He is accompanied by his grandson, Mose.  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 - Vision was normal until fifth or sixth grade, then progressively deteriorated  Orthopedic surgical history and musculoskeletal symptoms - History of multiple surgeries including knees, shoulder, and back - Right shoulder previously operated on, currently torn again - Prefers pain management over additional surgery due to painful recovery from prior rotator cuff surgery  Hypertension and weight management - Takes antihypertensive medication with well-controlled blood pressure - Follows a low-salt diet, adjusted over time - Experienced significant weight loss during the summer due to increased physical activity and sweating  Peripheral edema - No swelling in feet or legs     Outpatient Encounter Medications as of 04/23/2024  Medication Sig   acetaminophen  (TYLENOL ) 500 MG tablet Take 1,000 mg by mouth every 6 (six) hours as needed for moderate pain.   ASPIRIN  81 PO Take 81 mg by mouth daily.    carvedilol  (COREG ) 3.125 MG tablet TAKE ONE TABLET (3.125MG  TOTAL) BY MOUTHTWO TIMES DAILY WITH A MEAL   ciprofloxacin  (CIPRO ) 500 MG tablet TAKE ONE TABLET BY MOUTH TWICE A DAY   clotrimazole -betamethasone (LOTRISONE) cream Apply 1 application topically 2 (two) times daily.   diphenhydramine -acetaminophen  (TYLENOL  PM) 25-500 MG TABS tablet Take 2 tablets by mouth as needed.   JARDIANCE  10 MG TABS tablet TAKE ONE TABLET (10MG  TOTAL) BY MOUTH DAILY BEFORE BREAKFAST   losartan  (COZAAR ) 25 MG tablet TAKE ONE (1) TABLET BY MOUTH EVERY DAY   mexiletine (MEXITIL ) 150 MG capsule TAKE TWO CAPSULES (300MG  TOTAL) BY MOUTHTWO TIMES DAILY   Multiple Vitamin (MULTIVITAMIN) capsule Take 1 capsule by mouth daily.   Omega 3 1000 MG CAPS Take 1,000 mg by mouth daily.   omeprazole (PRILOSEC) 20 MG capsule TAKE ONE CAPSULE BY MOUTH DAILY   spironolactone  (ALDACTONE ) 25 MG tablet TAKE ONE TABLET (25MG  TOTAL) BY MOUTH DAILY   [DISCONTINUED] fexofenadine (ALLEGRA) 60 MG tablet Take 60 mg by mouth daily as needed for allergies. (Patient not taking: Reported on 04/23/2024)   No facility-administered encounter medications on file as of 04/23/2024.    Past Medical History:  Diagnosis Date   Arthritis    CHF (congestive heart failure) (HCC)    Chronic back pain    Complication of anesthesia    Patient said that after surgery his throat closed up in 2011   GERD (gastroesophageal reflux disease)    History of DVT of lower extremity    History  of kidney stones    Hx of blood clots 2010   Macular atrophy, retinal    bilateral   PONV (postoperative nausea and vomiting)    Torn rotator cuff    right shoulder    Past Surgical History:  Procedure Laterality Date   BACK SURGERY  1997   BACK SURGERY  04/2009   3 separate surgies for infection    COLONOSCOPY  07/21/2011   Procedure: COLONOSCOPY;  Surgeon: Lamar CHRISTELLA Hollingshead, MD;  Location: [facility];  Service: Endoscopy;  Laterality: N/A;  12:45 PM   KNEE  ARTHROPLASTY Right 02/19/2021   Procedure: COMPUTER ASSISTED TOTAL KNEE ARTHROPLASTY;  Surgeon: Fidel Rogue, MD;  Location: [facility];  Service: Orthopedics;  Laterality: Right;   KNEE ARTHROPLASTY Left 04/28/2022   Procedure: COMPUTER ASSISTED TOTAL KNEE ARTHROPLASTY;  Surgeon: Fidel Rogue, MD;  Location: [facility];  Service: Orthopedics;  Laterality: Left;   right shoulder surgery      RIGHT/LEFT HEART CATH AND CORONARY ANGIOGRAPHY N/A 04/16/2021   Procedure: RIGHT/LEFT HEART CATH AND CORONARY ANGIOGRAPHY;  Surgeon: Cherrie Toribio SAUNDERS, MD;  Location: [facility];  Service: Cardiovascular;  Laterality: N/A;   TONSILLECTOMY     TOTAL HIP ARTHROPLASTY Left 11/10/2015   TOTAL HIP ARTHROPLASTY Left 11/10/2015   Procedure: TOTAL LEFT HIP ARTHROPLASTY ANTERIOR APPROACH;  Surgeon: Rogue Fidel, MD;  Location: [facility];  Service: Orthopedics;  Laterality: Left;    Family History  Problem Relation Age of Onset   Liver disease Mother    Hypertension Father     Social History   Socioeconomic History   Marital status: Divorced    Spouse name: Not on file   Number of children: 3   Years of education: Not on file   Highest education level: Some college, no degree  Occupational History   Occupation: semi-retired    Comment: produce farmer  Tobacco Use   Smoking status: Never   Smokeless tobacco: Never  Vaping Use   Vaping status: Never Used  Substance and Sexual Activity   Alcohol  use: No   Drug use: Never   Sexual activity: Not Currently  Other Topics Concern   Not on file  Social History Narrative   Not on file   Social Drivers of Health   Tobacco Use: Low Risk (04/23/2024)   Patient History    Smoking Tobacco Use: Never    Smokeless Tobacco Use: Never    Passive Exposure: Not on file  Financial Resource Strain: Low Risk (04/13/2021)   Overall Financial Resource Strain (CARDIA)    Difficulty of Paying Living Expenses: Not hard at all  Food Insecurity: No Food Insecurity  (04/28/2022)   Hunger Vital Sign    Worried About Running Out of Food in the Last Year: Never true    Ran Out of Food in the Last Year: Never true  Transportation Needs: No Transportation Needs (04/28/2022)   PRAPARE - Administrator, Civil Service (Medical): No    Lack of Transportation (Non-Medical): No  Physical Activity: Not on file  Stress: Not on file  Social Connections: Not on file  Intimate Partner Violence: Not At Risk (04/28/2022)   Humiliation, Afraid, Rape, and Kick questionnaire    Fear of Current or Ex-Partner: No    Emotionally Abused: No    Physically Abused: No    Sexually Abused: No  Depression (PHQ2-9): Low Risk (04/23/2024)   Depression (PHQ2-9)    PHQ-2 Score: 0  Alcohol  Screen: Low Risk (  04/13/2021)   Alcohol  Screen    Last Alcohol  Screening Score (AUDIT): 0  Housing: Low Risk (04/28/2022)   Housing    Last Housing Risk Score: 0  Utilities: Not At Risk (04/28/2022)   [facility] Utilities    Threatened with loss of utilities: No  Health Literacy: Not on file   ROS     Objective    BP 107/70   Pulse 66   Ht 5' 10 (1.778 m)   Wt 193 lb (87.5 kg)   SpO2 97%   BMI 27.69 kg/m   Physical Exam Vitals and nursing note reviewed.  Constitutional:      Appearance: Normal appearance.  HENT:     Head: Normocephalic.  Eyes:     General: Visual field deficit present.     Extraocular Movements: Extraocular movements intact.     Pupils: Pupils are equal, round, and reactive to light.     Comments: Legally blind  Cardiovascular:     Rate and Rhythm: Normal rate and regular rhythm.  Pulmonary:     Effort: Pulmonary effort is normal.     Breath sounds: Normal breath sounds.  Musculoskeletal:     Cervical back: Normal range of motion and neck supple.  Neurological:     Mental Status: He is alert and oriented to person, place, and time.  Psychiatric:        Mood and Affect: Mood normal.        Thought Content: Thought content normal.          Assessment & Plan:   Problem List Items Addressed This Visit       Cardiovascular and Mediastinum   Hypertension (Chronic)   Well-controlled with losartan  25 mg and carvedilol  3.125 mg.  He reports stable blood pressure readings at home. - Continue current antihypertensive regimen. - Schedule annual physical in April.      Other Visit Diagnoses       Left upper arm pain    -  Primary   Intermittent muscular pain, likely not a biceps tendon tear due to full range of motion.       Return in about 6 months (around 10/21/2024) for chronic follow-up with PCP.   Leita Longs, FNP   "

## 2024-04-26 DIAGNOSIS — I10 Essential (primary) hypertension: Secondary | ICD-10-CM | POA: Insufficient documentation

## 2024-04-26 NOTE — Assessment & Plan Note (Signed)
 Well-controlled with losartan  25 mg and carvedilol  3.125 mg.  He reports stable blood pressure readings at home. - Continue current antihypertensive regimen. - Schedule annual physical in April.
